# Patient Record
Sex: Female | Born: 1935 | Race: Black or African American | Hispanic: No | Smoking: Never smoker
Health system: Southern US, Community
[De-identification: ages and names within clinical notes are randomized; demographics above are authoritative.]

## PROBLEM LIST (undated history)

## (undated) ENCOUNTER — Emergency Department (HOSPITAL_COMMUNITY): Payer: Medicare Other | Source: Home / Self Care

## (undated) DIAGNOSIS — E6609 Other obesity due to excess calories: Secondary | ICD-10-CM

## (undated) DIAGNOSIS — Z8489 Family history of other specified conditions: Secondary | ICD-10-CM

## (undated) DIAGNOSIS — I350 Nonrheumatic aortic (valve) stenosis: Secondary | ICD-10-CM

## (undated) DIAGNOSIS — E119 Type 2 diabetes mellitus without complications: Secondary | ICD-10-CM

## (undated) DIAGNOSIS — R42 Dizziness and giddiness: Secondary | ICD-10-CM

## (undated) DIAGNOSIS — R011 Cardiac murmur, unspecified: Secondary | ICD-10-CM

## (undated) DIAGNOSIS — I10 Essential (primary) hypertension: Secondary | ICD-10-CM

## (undated) DIAGNOSIS — Z973 Presence of spectacles and contact lenses: Secondary | ICD-10-CM

## (undated) DIAGNOSIS — I447 Left bundle-branch block, unspecified: Secondary | ICD-10-CM

## (undated) DIAGNOSIS — E785 Hyperlipidemia, unspecified: Secondary | ICD-10-CM

## (undated) DIAGNOSIS — Z9289 Personal history of other medical treatment: Secondary | ICD-10-CM

## (undated) DIAGNOSIS — E039 Hypothyroidism, unspecified: Secondary | ICD-10-CM

## (undated) DIAGNOSIS — I517 Cardiomegaly: Secondary | ICD-10-CM

## (undated) HISTORY — DX: Dizziness and giddiness: R42

## (undated) HISTORY — DX: Essential (primary) hypertension: I10

## (undated) HISTORY — DX: Nonrheumatic aortic (valve) stenosis: I35.0

## (undated) HISTORY — DX: Left bundle-branch block, unspecified: I44.7

## (undated) HISTORY — DX: Personal history of other medical treatment: Z92.89

## (undated) HISTORY — DX: Hyperlipidemia, unspecified: E78.5

## (undated) HISTORY — DX: Other obesity due to excess calories: E66.09

## (undated) HISTORY — DX: Cardiac murmur, unspecified: R01.1

## (undated) HISTORY — DX: Cardiomegaly: I51.7

## (undated) HISTORY — DX: Family history of other specified conditions: Z84.89

---

## 2001-07-07 ENCOUNTER — Encounter: Payer: Self-pay | Admitting: Emergency Medicine

## 2001-07-07 ENCOUNTER — Emergency Department (HOSPITAL_COMMUNITY): Admission: EM | Admit: 2001-07-07 | Discharge: 2001-07-07 | Payer: Self-pay | Admitting: Emergency Medicine

## 2006-10-23 HISTORY — PX: LAPAROSCOPIC CHOLECYSTECTOMY: SUR755

## 2007-01-28 ENCOUNTER — Inpatient Hospital Stay (HOSPITAL_COMMUNITY): Admission: EM | Admit: 2007-01-28 | Discharge: 2007-02-04 | Payer: Self-pay | Admitting: Emergency Medicine

## 2007-01-30 ENCOUNTER — Encounter (INDEPENDENT_AMBULATORY_CARE_PROVIDER_SITE_OTHER): Payer: Self-pay | Admitting: Specialist

## 2008-06-03 ENCOUNTER — Encounter: Admission: RE | Admit: 2008-06-03 | Discharge: 2008-06-03 | Payer: Self-pay | Admitting: Family Medicine

## 2008-06-07 ENCOUNTER — Ambulatory Visit: Payer: Self-pay | Admitting: Cardiovascular Disease

## 2008-06-07 ENCOUNTER — Inpatient Hospital Stay (HOSPITAL_COMMUNITY): Admission: EM | Admit: 2008-06-07 | Discharge: 2008-06-10 | Payer: Self-pay | Admitting: Emergency Medicine

## 2008-06-08 ENCOUNTER — Encounter: Payer: Self-pay | Admitting: Cardiology

## 2008-06-09 ENCOUNTER — Ambulatory Visit: Payer: Self-pay | Admitting: Vascular Surgery

## 2008-06-09 ENCOUNTER — Encounter: Payer: Self-pay | Admitting: Cardiology

## 2010-06-30 ENCOUNTER — Ambulatory Visit: Payer: Self-pay | Admitting: Cardiology

## 2010-11-01 ENCOUNTER — Ambulatory Visit: Payer: Self-pay | Admitting: Cardiology

## 2011-02-08 ENCOUNTER — Other Ambulatory Visit: Payer: Self-pay | Admitting: *Deleted

## 2011-02-08 DIAGNOSIS — E7800 Pure hypercholesterolemia, unspecified: Secondary | ICD-10-CM

## 2011-02-08 MED ORDER — ROSUVASTATIN CALCIUM 10 MG PO TABS: ORAL_TABLET | ORAL | Status: DC

## 2011-02-08 NOTE — Telephone Encounter (Signed)
Refilled meds per fax request.  

## 2011-03-07 NOTE — Discharge Summary (Signed)
NAMEJIMIA, GENTLES           ACCOUNT NO.:  [id number]   MEDICAL RECORD NO.:  [id number]          PATIENT TYPE:  INP   LOCATION:  2011                         FACILITY:  [facility]   PHYSICIAN:  Cassell Clement, M.D. DATE OF BIRTH:  01/23/1931   DATE OF ADMISSION:  06/07/2008  DATE OF DISCHARGE:  06/10/2008                               DISCHARGE SUMMARY   FINAL DIAGNOSES:  1. Syncope.  2. Uncontrolled hypertension.  3. Moderately severe aortic stenosis.  4. Left ventricular hypertrophy with diastolic dysfunction.  5. Hypercholesterolemia.   OPERATIONS PERFORMED:  1. Two-dimensional echocardiogram.  2. Carotid Dopplers.   HISTORY:  This 77 year old African-American woman was admitted through  the emergency room after having syncope in church on Sunday morning,  June 07, 2008.  She had not felt well the day before and felt a little  queasy and had a low-grade temperature, but on the morning of Sunday,  she felt well and went to church.  She did not eat any breakfast that  morning.  After standing and then sitting down, she passed out for  approximately a minute and she did have a warning that she was about to  pass out before she actually had syncope.  There was no definite seizure  activity.  She was brought to the emergency room where she felt well,  but because of the history, was admitted for further evaluation.  She  was also noted to have marked hypertension.   Medications at home had included aspirin 81 mg daily, Crestor 10 mg 1/2  tablet daily, Diovan 320, HCT 25 one daily, Toprol-XL 100 mg daily,  Tarka 4/240 daily, potassium 10 mEq daily, and calcium 600 mg daily.  She had been on a steroid the previous week for a pinched nerve, but  had finished it about a week prior to this episode.   Examination on admission was remarkable for hypertension, normal oxygen  saturation on room air, and a harsh grade 3/6 systolic murmur of the  aortic stenosis at the base.  She did  not have evidence of heart failure  or peripheral edema.   Her chest x-ray showed cardiomegaly, but no acute process and her EKG  showed inferolateral nonspecific ST-T wave abnormalities, felt to be  secondary to her hypertension.   Admission lab data showed no evidence of acute myocardial infarction.  Cardiac markers were normal.  Renal function was normal with a BUN of  16, creatinine of 0.9.  Potassium was 3.4 on admission and the following  day it was 3.1.  Rhythm was sinus rhythm with sinus bradycardia.   HOSPITAL COURSE:  She was initially admitted to CCU.  The following day,  she was transferred to telemetry.  Her Tarka, which was a combination of  Mavik and verapamil was stopped because of her bradycardia and her dose  of beta-blocker was decreased from 100-50 mg of metoprolol XL.  In the  place of Tarka, we have put her on lisinopril and amlodipine.  Because  of her low potassium, blood was drawn for an aldosterone and renin  ratio, but that result is not available at the  time of discharge.  She  also underwent a 24-hour urine collection for metanephrines and this is  also pending.  In the hospital, blood pressure improved on her new  medical regimen, which included a trial of spironolactone 25 mg daily.  Potassium was repleted cautiously in the phase of spironolactone and  lisinopril.  By June 10, 2008, blood pressure was down to an  acceptable range for her, 165/87.  Lungs were clear and she was feeling  well.  She was felt to be ready for discharge.  She will be discharged  to home.  She will have home health nurse.  She will purchase a home  blood pressure monitor and cuff for home use and keep a written record.  She will be instructed by dietician prior to discharge on a low-  cholesterol, no added salt, heart-healthy diet.  She is a nonsmoker.   DISCHARGE REGIMEN:  She will be on amlodipine 10 mg 1 daily, aspirin 81  mg daily, Crestor 10 mg 1/2 tablet daily, calcium  with vitamin D daily,  Toprol-XL 50 mg daily, lisinopril 20 mg twice a day, spironolactone 25  mg once a day, K-Dur 10 mEq daily, potassium 25 mg daily, and Tylenol as  needed.   CONDITION ON DISCHARGE:  Improved.   She will be seen back in the office in 1 week for office visit and  fasting lipid panel and CMET.  Her cholesterol was not checked in the  hospital, but we will check it as an outpatient.           ______________________________  Cassell Clement, M.D.     TB/MEDQ  D:  06/10/2008  T:  06/10/2008  Job:  [id number]   cc:   Windle Guard, M.D.

## 2011-03-07 NOTE — H&P (Signed)
[name], [name] NO.:  [id number]   MEDICAL RECORD NO.:  [id number]          PATIENT TYPE:  EMS   LOCATION:  MAJO                         FACILITY:  [facility]   PHYSICIAN:  Christell Faith, MD   DATE OF BIRTH:  [birth year]   DATE OF ADMISSION:  06/07/2008  DATE OF DISCHARGE:                              HISTORY & PHYSICAL   Admitted to Dr. Cassell Clement with [facility].   PRIMARY CARE PHYSICIAN:  1. Currie Paris, MD  2. Gabrielle Dare [name], MD   CHIEF COMPLAINT:  Syncope.   HISTORY OF PRESENT ILLNESS:  This is a 78 year old African American  female with a history of significant hypertension and possible aortic  stenosis who experienced syncope today in [place] while in a seated  position.  This has never happened before, although she does remember  feeling lightheaded yesterday.  The patient has no known history of  ischemic heart disease and has not experienced any chest pain or  congestive heart failure symptoms.  The patient took all of her  medicines this morning but did not eat breakfast.  Despite this, she  fell normal going to church.  She had been standing up singing and then  sat down for the sermon and had awareness that she was about to pass out  prior to doing so.  Witnesses say that they think she was unconscious  for about 1 minute.  There is also description of mild twitching and  there was some initial concern for a seizure although there was no  postictal state and no loss of continence.  The patient quickly felt  back to normal and feels well at this point.   PAST MEDICAL HISTORY:  1. Hypertension.  2. History of cholecystitis and acute pancreatitis in April 2008 at      which time she underwent laparoscopic cholecystectomy.  3. Per previous hospital records, there is possible aortic stenosis      and aortic insufficiency.  4. Diastolic dysfunction with postoperative congestive heart failure      following lap chole.  5. Pulmonary hypertension.  6. Hyperlipidemia.   SOCIAL HISTORY:  [place].  She is a  widow.  She has never smoked and does not use alcohol.   FAMILY HISTORY:  Mother lived to be 34 but has significant hypertension  and several strokes.  Father had Parkinson's disease.  Several siblings  have hypertension.   ALLERGIES:  None.   MEDICINES:  1. Aspirin 81 mg p.o. daily.  2. Crestor 10 mg p.o. daily.  3. Diovan HCT 320/25 mg p.o. daily.  4. Toprol-XL 100 mg p.o. daily.  5. Tarka (trandolapril and verapamil 4/240 mg p.o. daily).  6. Potassium chloride 10 mEq p.o. daily.  7. Calcium supplementation 600 mg p.o. daily.  In addition, she finished an oral steroid taper last week for a pinched  nerve in her neck.   REVIEW OF SYSTEMS:  Positive for neuropathic upper neck pain.  Positive  for syncope today preceded by presyncope.  Positive for presyncope  yesterday otherwise the balance of 14 systems  is reviewed and is  negative.   PHYSICAL EXAMINATION:  VITAL SIGNS:  Temperature 97.6, pulse 50-55,  respiratory rate 16, blood pressure initially 183/73, repeat 165/63,  saturation 100% on room air.  GENERAL:  This is a pleasant older African American female in no acute  distress.  Pupils are equal, round, and reactive to light.  Sclerae are  clear.  Extraocular movements intact.  Mucous membranes are moist.  Oropharynx is clear.  NECK:  Supple.  No elevation of jugular venous pressure.  No carotid  bruits.  Carotid upstrokes are normal to palpation.  There is no reflex  bradycardia with palpation of the carotid arteries.  CARDIAC:  Reveals bradycardic rate, regular rhythm, 3/6 harsh systolic  ejection murmur at the base, S2 is well preserved.  LUNGS:  Clear to auscultation bilaterally without wheezing or rales.  ABDOMEN:  Soft, nontender, and nondistended with normal bowel sounds.  EXTREMITIES:  Reveal trace puffy ankle edema.  Dorsalis pedis and   posterior tibialis pulses are diminished but present.  The feet appear  warm and well-perfused.  Radial pulses are 2+.  MUSCULOSKELETAL:  No acute joint effusions or deformities.  NEUROLOGIC:  Awake, alert, and oriented x3.  Facial expression is  symmetric and normal 5/5 strength in all four extremities.  SKIN:  Reveals no rash or skin lesions.  Skin is warm and dry.   DIAGNOSTIC TESTS:  Chest X-ray shows mild vascular congestion with no  edema.  Head CT shows no acute process.  EKG shows sinus bradycardia  with a rate of 52 beats per minute.  There are inferolateral T-wave  inversions, which are increased compared to April 2008.   LABORATORY DATA:  White blood cell 4.8, hemoglobin 13.6, platelets 184,  sodium 137, potassium 3.4, BUN 16, creatinine 0.9, glucose 125, BNP 131,  CK-MB less than 1, troponin less than 0.05.   IMPRESSION:  A 77 year old [place] American female with a history of  hypertension and possible aortic stenosis who experienced syncope today  while in a seated position.   PLAN:  1. Admit to telemetry and monitor her heart rate and heart rhythm.  2. Decrease her Toprol-XL to allow her heart rate to increase.      Likewise, we will substitute amlodipine for verapamil.  3. Otherwise, continue her current medicines as described above.  4. Rule out myocardial infarction by cycling serial EKGs and cardiac      enzymes.  The patient will probably need inpatient versus      outpatient stress test given the EKG changes and symptoms.  5. Check transthoracic echo to evaluate LV function and aortic valve.      Murmur is harsh, however, S2 is well preserved and carotid      upstrokes are normal, doubt severe AS.  6. The patient is noted to have hypokalemia despite being on both ACE      inhibitor and angiotensin receptor      blocker.  This raises question of hyperaldosteronism.  We will      check fasting a.m. serum aldosterone and renin levels.  7. The patient will be  placed on subcutaneous Lovenox for DVT      prophylaxis.      Christell Faith, MD  Electronically Signed     NDL/MEDQ  D:  06/07/2008  T:  06/08/2008  Job:  [id number]   cc:   Dr. Marita Snellen

## 2011-03-10 NOTE — H&P (Signed)
[name], [name]           ACCOUNT NO.:  [id number]   MEDICAL RECORD NO.:  [id number]          PATIENT TYPE:  INP   LOCATION:  5733                         FACILITY:  [facility]   PHYSICIAN:  Gabrielle Dare. [name], M.D.DATE OF BIRTH:  1931-03-28   DATE OF ADMISSION:  01/28/2007  DATE OF DISCHARGE:                              HISTORY & PHYSICAL   CHIEF COMPLAINT:  Upper abdominal pain.   HISTORY OF PRESENT ILLNESS:  The patient is a 77 year old African-  American female with an acute onset of epigastric abdominal pain around  8 p.m. last night after eating some cornbread and coffee.  The pain was  constant.  She was unable to get comfortable.  She describes the pain in  the epigastric region and radiating through to her back.  She tried  taking some Tums without relief.  She came [facility].   PAST MEDICAL HISTORY:  1. Hypertension.  2. Aortic stenosis.  3. Aortic insufficiency.  4. Moderate pulmonary hypertension.  5. Cardiac diastolic dysfunction.  6. Cardiomyopathy.   Significant family history includes her father had a history of  Parkinson's.   SOCIAL HISTORY:  She does not smoke.  She does not drink alcohol or use  drugs.  She is a widow and lives alone.  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   ALLERGIES:  No known drug allergies.   CURRENT MEDICATIONS:  1. Aspirin 81 mg daily.  2. Crestor 10 mg daily.  3. Diovan/HCTZ 320/25 daily.  4. Toprol XL 100 mg daily.  5. Tarka 4-240 mg 1 daily.  6. Tektura 300 mg daily.   REVIEW OF SYSTEMS:  GASTROINTESTINAL:  Significant for the epigastric  pain and also some nausea.  PULMONARY:  No shortness of breath.  CARDIAC:  No chest pain.  The remainder of the review of systems is  unremarkable.   PHYSICAL EXAMINATION:  Temperature 97.2, pulse 55, respirations 22,  blood pressure 200/110 manually.  In general, she is awake and alert, and mildly uncomfortable.  HEENT:  Oral mucosa is moist.  Sclerae is clear.   Pupils are reactive.  NECK:  Supple with no tenderness.  LUNGS:  Clear to auscultation with descent respiratory excursions.  HEART:  Regular, no significant murmurs are heard.  Impulse is palpable  on the left chest.  ABDOMEN:  Epigastric tenderness with mild to moderate abdominal  distention.  No focal mass is palpated.  Bowel sounds are hypoactive,  and no organomegaly is noted.  EXTREMITIES:  Has some moderate peripheral edema especially in the  ankles.  SKIN:  Warm and dry with no rashes.   LABORATORY STUDIES:  White blood cell count 14.3, hemoglobin 14.1,  platelets 239, AST 353, ALT 234, total bilirubin 1.2, lipase is 3501.  CT scan of the abdomen:  There is inflammatory process around the  pancreas and gallbladder with some gallbladder wall thickening  consistent with pancreatitis and acute cholecystitis.  Abdominal  ultrasound demonstrates irregular gallbladder wall thickening, no  gallstones noted.  There is mild biliary dilatation of the common bile  duct of 10 mm.   IMPRESSION:  1.  Acute biliary pancreatitis with some mild common bile duct      dilatation.  2. Associated cholecystitis.  3. Uncontrolled hypertension.   PLAN:  Plan will be to admit her to the [facility] service.  We will obtain  Cardiology consultation in light of her history and upcoming need for  surgery.  We will treat her with bowel rest and IV antibiotics and IV  fluid to pull down her pancreatitis, and once that improves we will plan  on proceeding with cholecystectomy, and plan was discussed in detail  with the patient and her family.   Cardiology evaluation for managing her hypertension and for operative  clearance.      Gabrielle Dare [name], M.D.  Electronically Signed     BET/MEDQ  D:  01/28/2007  T:  01/28/2007  Job:  [id number]   cc:   Dr. Marita Snellen, Cardiologist

## 2011-03-10 NOTE — Op Note (Signed)
[name], [name]           ACCOUNT NO.:  [id number]   MEDICAL RECORD NO.:  [id number]          PATIENT TYPE:  INP   LOCATION:  5733                         FACILITY:  [facility]   PHYSICIAN:  Gabrielle Dare. [name], M.D.DATE OF BIRTH:  03-26-1931   DATE OF PROCEDURE:  DATE OF DISCHARGE:                               OPERATIVE REPORT   PREOPERATIVE DIAGNOSIS:  Biliary pancreatitis.   POSTOPERATIVE DIAGNOSIS:  Biliary pancreatitis.   PROCEDURE:  Laparoscopic cholecystectomy and intraoperative  cholangiogram.   SURGEON:  Gabrielle Dare. [name], M.D.   ASSISTANT:  Adolph Pollack, M.D.   HISTORY OF PRESENT ILLNESS:  The patient is a 77 year old African  American female who we admitted on January 28, 2007 with biliary  pancreatitis.  Her liver function tests and lipase have decreased  significantly.  Cardiology has been following her and has cleared her  for surgery and we are planning laparoscopic cholecystectomy with  intraoperative cholangiogram.   PROCEDURE IN DETAIL:  Informed consent was obtained.  The patient  received intravenous antibiotics.  She was brought to the operating  room.  General anesthesia was administered.  Her abdomen was prepped and  draped in a sterile fashion.  The infraumbilical region was infiltrated  with 0.25% Marcaine with epinephrine and infraumbilical incision was  made.  Subcutaneous tissues were dissected down revealing the anterior  fascia.  This was divided sharply.  The peritoneal cavity was then  entered under direct vision without difficulty.  A 0 Vicryl pursestring  suture was placed around the fascial opening and the Hasson trocar was  inserted into the abdomen.  The abdomen was insufflated with CO2 in a  standard fashion.  Under direct vision, the 11-mm epigastric, and two 5-  mm lateral ports were placed.  The 0.25% Marcaine with epinephrine was  used at all port sites.  Laparoscopic exploration revealed some  adhesions to the abdominal wall  in the right lateral portion and above  the liver.  These were taken down with sharp dissection.  They were  filmy and did not contain bowel.  Once this was done, she still had a  lot of colonic dilatation.  After acquiring good visualization at the  gallbladder, another 5-mm port was inserted to the left side of the  midline after infiltrating local anesthetic again and a flexible liver  retractor was inserted.  This was able to gently hold down the colon out  of the way and give excellent visualization.  The dome of the  gallbladder was then directed superomedially.  The infundibulum was  retracted inferolaterally.  Dissection began laterally and progressed  medially, easily identifying the cystic duct and the cystic artery.  Both were circumferentially dissected.  Dissection continued on the  cystic duct until a large window was created between the infundibulum  and the cystic duct and the liver.  Once this was done with excellent  visualization, a clip was placed on the infundibulocystic duct junction  after milking some stones.  They were in the cystic duct back up into  the gallbladder.  A small nick was made and a cystic duct cholangiogram  catheter  was inserted.  Intraoperative cholangiogram showed some  questionable areas of filling defect, but it was reviewed with the  radiologists and no common bile duct stones were noted on their thorough  review.  The cholangiogram catheter was removed.  Three clips were  placed proximally on the cystic duct and it was divided.  Two clips were  placed proximally and one distally on the cystic artery and that was  divided itself, but was taken off the liver bed with a Bovie cautery.  Excellent hemostasis was obtained along the way.  The gallbladder was  placed in an EndoCatch bag and taken out of the abdomen via the  infraumbilical port site.  The abdomen was copiously irrigated.  Irrigation fluid returned clear.  The liver bed was rechecked  and  remained dry.  Clips remained in good position.  The liver retractor was  unfolded and removed under direct vision.  Other ports were removed  under direct vision.  The pneumoperitoneum was released.  The Hasson  trocar was removed.  The infraumbilical fascia was closed by tying a 0  Vicryl pursestring suture with care not to trap any intra-abdominal  contents.  All five wounds were copiously irrigated.  The skin of each  was closed with a running 4-0 Vicryl subcuticular stitch.  Sponge,  needle, and instrument counts were correct.  Benzoin, Steri-Strips and  sterile dressings were applied.  The patient tolerated the procedure  well without apparent complication, and was taken to the recovery room  in stable condition.      Gabrielle Dare [name], M.D.  Electronically Signed     BET/MEDQ  D:  01/30/2007  T:  01/30/2007  Job:  [id number]

## 2011-03-10 NOTE — Discharge Summary (Signed)
[name], [name]           ACCOUNT NO.:  [id number]   MEDICAL RECORD NO.:  [id number]          PATIENT TYPE:  INP   LOCATION:  6711                         FACILITY:  [facility]   PHYSICIAN:  Currie Paris, M.D.DATE OF BIRTH:  [birth year]   DATE OF ADMISSION:  01/28/2007  DATE OF DISCHARGE:  02/04/2007                               DISCHARGE SUMMARY   CHIEF COMPLAINT/REASON FOR ADMISSION:  Ms. Gruetzmacher is a 77 year old  female patient with a history of aortic stenosis and insufficiency,  pulmonary hypertension, cardiac diastolic dysfunction, cardiomyopathy  and hypertension who presented with epigastric abdominal pain after  eating.  Pain was constant in the epigastric region and radiating to her  back.  She presented to the ER.  She was found to have elevated blood  pressure 200/110.  On abdominal exam, she had epigastric tenderness with  mild to moderate abdominal distention without focal masses palpated.  Her white count was 14,300, AST 353, ALT 234, total bilirubin 1.2 and  lipase was 3501.  CT of the abdomen demonstrated inflammatory process  around the pancreas and gallbladder with some gallbladder wall  thickening consistent with acute pancreatitis and acute cholecystitis.  An abdominal ultrasound was also performed that revealed an irregular  gallbladder wall thickening, no gallstones, mild biliary dilatation of  the common bile duct at 10 mm.  The patient was admitted with following  diagnosis.  1. Biliary colic with acute cholecystitis.  2. Acute biliary pancreatitis.  3. Possible choledocholithiasis and mild common bile duct dilatation.  4. Uncontrolled hypertension presumed secondary to pain.   HOSPITAL COURSE:  The patient was admitted to the hospital with bowel  rest, IV fluids and IV antibiotics with plans to cool down her  pancreatitis and cholecystectomy once pancreatitis was clear.  Because  for cardiac history, Dr. Patty Sermons with cardiology was asked to  evaluate  the patient for her upcoming surgery for operative clearance.   Within 24 hours of admission, the patient was evaluated by cardiology.  Dr. Elease Hashimoto was on call for Dr. Patty Sermons.  Based on his evaluation, he  felt she should be an optimal candidate for surgical procedure and no  significant risks.  On hospital day #1, the patient's LFTs were  decreasing and her lipase had decreased markedly to 373.  She had  decreased pain and was not experiencing any more nausea or vomiting.  Her biliary pancreatitis seemed to be resuming and based on her  ultrasound it appears she had acalculous cholecystitis versus passage of  a solitary stone based on the ultrasound result and the common bile duct  dilatation.  She was started on sequential compressive stockings as well  as Lovenox and plans were to proceed with laparoscopic cholecystectomy  in the morning.  She was continued on Unasyn IV.  By January 30, 2007 the  patient's LFTs had improved.  Her lipase was down to 85 and it was felt  her biliary pancreatitis was resolved, so she would be appropriate to  proceed with gallbladder surgery.  On January 30, 2007 the patient did  undergo a laparoscopic cholecystectomy with intraoperative  cholangiogram, tolerated the procedure  well and was sent back to the  floor to recover.  Later that evening, the patient developed shortness  of breath and wheezing.  She was placed on BiPAP and physicians were  notified.  Cardiology evaluated the patient.  Chest x-ray showed  increased cardiomegaly and probably acute CHF and atelectasis.  A BNP  was checked and she was given Lasix 40 IV on a now basis.  She was  subsequently transferred to the 3200 unit for monitoring since she was  requiring BiPAP for adequate oxygenation.  She was also started on  inhaled bronchodilators.  Cardiology continued to follow the patient for  the next several days.  She did spike a temperature up to 101.3 that had  defervesced by the  morning.  Her BiPAP was used mainly at night and she  was having good O2 sats on nasal cannula O2.  Her white count was 14,000  24 hours prior.  This was on postop day #1.  Her blood pressure was  still elevated and she was followed closely.  Her potassium was low and  this was repeated.  She was also having a productive cough and it was  felt she may have an early pneumonia, so Avelox was started.  By postop  day #3 she was tolerating p.o.'s, her white count was 9900, potassium  3.8, creatinine 0.95 after Lasix.  She had increased productive cough.  From a surgical standpoint, she was doing better.  Cardiology agreed  that the patient was stable enough to transfer from step down to basic  telemetry bed.  From a cardiology standpoint, her blood pressure was  still elevated and they continued to follow the patient.  From a  surgical standpoint, she was deemed appropriate for discharge home.  She  was tolerating solid diet, having bowel movements and her wounds were  stable.  By February 04, 2007 which would have been postop day #3,  cardiology felt the patient was appropriate for discharge home.  She was  not having any respiratory distress.  She will continue on similar meds  prior to admission.  Dr. Patty Sermons has noted any new meds or changes.   DISCHARGE DIAGNOSES:  1. Acute biliary pancreatitis, resolved.  2. Status post laparoscopic cholecystectomy.  3. Transaminitis, resolved.  4. History of cardiomyopathy and cardiac valvular disease with acute      exacerbation of congestive heart failure postoperatively.  5. Controlled hypertension, better.  6. Mild pneumonia, improving.  Hypoxia resolved.   DISCHARGE MEDICATIONS:  1. Aspirin 81 mg daily.  2. Crestor 10 mg daily.  3. Diovan/HCT 320/25 daily.  4. Generic Toprol XL 100 mg daily.  5. Tarka 4/240 1 daily.  6. Tekturna 300 mg daily.  7. K-Dur 10 mEq 1 daily, generic formulation, this is a new      medication.  8. Avelox 400 mg 1  daily for 5 days.  This is new.  9. Vicodin 1 tablet every 6 hours as needed for pain.  This is new.  10.Tylenol for mild pain.   DIET:  Low sodium heart-healthy.   WOUND CARE:  Allow any Steri-Strips to fall off.   ACTIVITY:  Increase activity slowly.  May shower.  No lifting greater  than 15 pounds for 3 weeks.  No driving while taking any narcotic pain  medications.   FOLLOWUP:  She needs to followup with Dr. [name] in 2 weeks.  She  needs to call for an appointment.  She needs to see Dr. Patty Sermons  in 1  week for an office visit to have CBC, C-MET and BMP checked.  Nurse will  call you with appointment.      Allison L. Rennis Harding, N.P.      Currie Paris, M.D.  Electronically Signed    ALE/MEDQ  D:  05/16/2007  T:  05/17/2007  Job:  [id number]   cc:   Cassell Clement, M.D.  Dr. [name]

## 2011-03-10 NOTE — Consult Note (Signed)
[name], [name]           ACCOUNT NO.:  [id number]   MEDICAL RECORD NO.:  [id number]          PATIENT TYPE:  INP   LOCATION:  1824                         FACILITY:  [facility]   PHYSICIAN:  Vesta Mixer, M.D. DATE OF BIRTH:  12/09/1930   DATE OF CONSULTATION:  01/28/2007  DATE OF DISCHARGE:                                 CONSULTATION   REASON FOR CONSULTATION:  [name] is a 77 year old female with  a history of hypertension and valvular heart disease.  She is admitted  to the hospital with severe abdominal pain and the diagnosis of  acalculous cholecystitis.   The patient has a long history of hypertension and heart murmurs.  She  was last seen in our office on December 24, 2006.  At that time she was  congested due to a head cold but otherwise was doing fairly well.  She  denies any cardiac problems.  She specifically denies any angina or  worsening shortness of breath.  She exercises on a fairly regular basis  and has not had any particular problems with exercise.  She denies any  syncope or presyncope.  She denies any hemoptysis, cough or sputum  production.   Last night she developed severe abdominal pain.  The pain did not  resolve and she presented to the emergency room this morning.   CURRENT MEDICATIONS:  1. Toprol XL 100 mg a day.  2. Tarka 4 mg/240 mg once a day.  3. Crestor 5 mg a day.  4. Diovan 320 mg/25 mg once a day.  5. Aspirin 81 mg a day.  6. Tekturna 300 mg a day.   ALLERGIES:  SHE HAS NO KNOWN DRUG ALLERGIES.   PAST MEDICAL HISTORY:  1. Hypertension.  2. Hyperlipidemia.  3. Aortic stenosis.  4. Pulmonary hypertension.  5. Diastolic dysfunction.   SOCIAL HISTORY:  The patient is a nonsmoker.  She used to work as an  Midwife at [employer].   FAMILY HISTORY:  Noncontributory.   REVIEW OF SYSTEMS:  As noted in the HPI and is otherwise negative.  She  denies any cough or sputum production.  She denies any previous cardiac  problems.  She has had one episode of abdominal pain that is similar to  this one.  She denies any syncope or presyncope, PND or orthopnea.   EXAMINATION:  GENERAL:  She is an elderly black female in mild distress.  VITAL SIGNS:  Her blood pressure is 200/110 with a heart rate of 70.  HEENT:  Exam reveals 2+ carotids, she has no bruits, no JVD.  LUNG:  Exam reveals a few basilar crackles.  HEART:  Regular rate, S1-S2.  She has a 2/6 systolic ejection murmur  radiating to the upper left sternal border.  ABDOMINAL:  Exam reveals good bowel sounds and is mildly tender  especially mid upper quadrant.  EXTREMITIES:  She has no clubbing, cyanosis or edema.  NEUROLOGICAL:  Exam is nonfocal.   LABORATORY DATA:  Her sodium is 142, potassium is 3.4, chloride is 103,  CO2 is 31, creatinine 0.9, glucose is 165.  White blood cell count is  14.3, hemoglobin is 14.1, lipase is 3501.   IMPRESSION AND PLAN:  1. Acalculous cholecystitis.  The patient had a gallbladder ultrasound      which did not reveal any gallstones.  She has a markedly elevated      lipase level.  She will be kept n.p.o. for a couple of days and      further plans per the surgeons.  2. Hypertension.  The patient's blood pressure remains elevated.  The      surgeons have kept her on p.o. medications for now.  We can also      substitute some IV medications if needed including IV hydralazine      10-25 mg IV every 4-6 hours as needed as well as labetalol 10-20 mg      IV every couple of hours as needed.  We will start her on some low      dose hydralazine as well as her p.o. medications.  We will also get      an EKG today as well as one tomorrow.           ______________________________  Vesta Mixer, M.D.     PJN/MEDQ  D:  01/28/2007  T:  01/28/2007  Job:  [id number]   cc:   [facility]  Cassell Clement, M.D.

## 2011-03-16 ENCOUNTER — Encounter: Payer: Self-pay | Admitting: *Deleted

## 2011-03-16 DIAGNOSIS — I10 Essential (primary) hypertension: Secondary | ICD-10-CM

## 2011-03-16 DIAGNOSIS — I517 Cardiomegaly: Secondary | ICD-10-CM | POA: Insufficient documentation

## 2011-03-16 DIAGNOSIS — I350 Nonrheumatic aortic (valve) stenosis: Secondary | ICD-10-CM | POA: Insufficient documentation

## 2011-03-16 DIAGNOSIS — E785 Hyperlipidemia, unspecified: Secondary | ICD-10-CM | POA: Insufficient documentation

## 2011-03-17 ENCOUNTER — Ambulatory Visit (INDEPENDENT_AMBULATORY_CARE_PROVIDER_SITE_OTHER): Payer: Medicare Other | Admitting: Cardiology

## 2011-03-17 ENCOUNTER — Encounter: Payer: Self-pay | Admitting: Cardiology

## 2011-03-17 VITALS — BP 140/90 | HR 66 | Wt 216.0 lb

## 2011-03-17 DIAGNOSIS — I350 Nonrheumatic aortic (valve) stenosis: Secondary | ICD-10-CM

## 2011-03-17 DIAGNOSIS — I359 Nonrheumatic aortic valve disorder, unspecified: Secondary | ICD-10-CM

## 2011-03-17 DIAGNOSIS — I10 Essential (primary) hypertension: Secondary | ICD-10-CM

## 2011-03-17 DIAGNOSIS — I119 Hypertensive heart disease without heart failure: Secondary | ICD-10-CM

## 2011-03-17 DIAGNOSIS — E785 Hyperlipidemia, unspecified: Secondary | ICD-10-CM

## 2011-03-17 LAB — BASIC METABOLIC PANEL
GFR: 47.18 mL/min — ABNORMAL LOW (ref >60.00)
Potassium: 4 mEq/L (ref 3.5–5.1)
Sodium: 140 mEq/L (ref 135–145)

## 2011-03-17 NOTE — Assessment & Plan Note (Signed)
Patient has a long history of essential hypertension associated with exogenous obesity.  Recently she has been trying hard to lose weight.  She has not been expressing any chest pain or shortness of breath.  She's had no dizziness or syncope.  Sometime she will be slightly dizzy in the morning before she takes her medication.

## 2011-03-17 NOTE — Progress Notes (Signed)
Arville Care Date of Birth:  [birth year] [facility] / [facility], [place] [address]   [address] [place], [place]  [address] [phone]           Fax   [phone]  HPI: This pleasant 77 year old woman is seen for a four-month followup office visit.  She has a history of essential hypertension and hyperlipidemia.  She does have aortic stenosis.  Her last echocardiogram was in March of 2011 at which time her aortic valve area was 0.9.  She has LVH with diastolic dysfunction.  Since last visit she has not been expressing any chest pain.  She notes occasional dizziness early in the morning before she takes her medicine.  She has occasional leg cramps at night and we are checking a potassium level today.  She has not had cardiac catheterization.  She is not known to be diabetic.  Current Outpatient Prescriptions  Medication Sig Dispense Refill  . Acetaminophen (TYLENOL PO) Take 1 tablet by mouth as needed.        Marland Kitchen amLODipine (NORVASC) 10 MG tablet Take 10 mg by mouth daily.        Marland Kitchen aspirin 81 MG tablet Take 81 mg by mouth daily.        . hydrochlorothiazide 25 MG tablet Take 25 mg by mouth daily.        Marland Kitchen lisinopril (PRINIVIL,ZESTRIL) 20 MG tablet Take 20 mg by mouth 2 (two) times daily.        . metoprolol (LOPRESSOR) 50 MG tablet Take 25 mg by mouth 2 (two) times daily.        . rosuvastatin (CRESTOR) 10 MG tablet Taking 1/2 daily       . spironolactone (ALDACTONE) 25 MG tablet Take 25 mg by mouth 2 (two) times daily.        Marland Kitchen DISCONTD: rosuvastatin (CRESTOR) 10 MG tablet Take as directed  30 tablet  11    No Known Allergies  Patient Active Problem List  Diagnoses  . Hypertension  . Hyperlipidemia  . LVH (left ventricular hypertrophy)  . Aortic stenosis    History  Smoking status  . Never Smoker   Smokeless tobacco  . Not on file    History  Alcohol Use No    No family history on file.  Review of Systems: The patient denies any heat or cold  intolerance.  No weight gain or weight loss.  The patient denies headaches or blurry vision.  There is no cough or sputum production.  The patient denies dizziness.  There is no hematuria or hematochezia.  The patient denies any muscle aches or arthritis.  The patient denies any rash.  The patient denies frequent falling or instability.  There is no history of depression or anxiety.  All other systems were reviewed and are negative.   Physical Exam: Filed Vitals:   03/17/11 0915  BP: 140/90  Pulse: 66  General appearance reveals a well-developed nourished woman in no distress.  Pupils equal and reactive.   Extraocular Movements are full.  There is no scleral icterus.  The mouth and pharynx are normal.  The neck is supple.  The carotids reveal no bruits.  The jugular venous pressure is normal.  The thyroid is not enlarged.  There is no lymphadenopathy.The chest is clear to percussion and auscultation. There are no rales or rhonchi. Expansion of the chest is symmetrical.The precordium is quiet.  The first heart sound is normal.  The second heart sound is  physiologically split.  There is no murmur gallop rub or click.  There is no abnormal lift or heave.The abdomen is soft and nontender. Bowel sounds are normal. The liver and spleen are not enlarged. There Are no abdominal masses. There are no bruits.The pedal pulses are good.  There is no phlebitis or edema.  There is no cyanosis or clubbing.Strength is normal and symmetrical in all extremities.  There is no lateralizing weakness.  There are no sensory deficits.The skin is warm and dry.  There is no rash.    Assessment / Plan: Continue attempts at weight loss and continue careful diet.  Recheck in 4 months for followup office visit and lab work

## 2011-03-17 NOTE — Assessment & Plan Note (Signed)
The patient has not had any of the cardinal manifestations of severe aortic stenosis.

## 2011-03-17 NOTE — Assessment & Plan Note (Signed)
The patient is on Crestor.  She's not having any myalgias from statin therapy.

## 2011-03-21 ENCOUNTER — Telehealth: Payer: Self-pay | Admitting: Cardiology

## 2011-03-21 NOTE — Telephone Encounter (Signed)
Message copied by Karle Plumber on Tue Mar 21, 2011 10:13 AM ------      Message from: Cassell Clement      Created: Sat Mar 18, 2011  3:44 PM       K= is normal.  Blood sugar only slightly high.Kidneys dry.  Increse water intake

## 2011-03-21 NOTE — Telephone Encounter (Signed)
Labs reported and encouraged her to increase water intake. Verbalizes understanding.

## 2011-05-01 ENCOUNTER — Other Ambulatory Visit: Payer: Self-pay | Admitting: *Deleted

## 2011-05-01 DIAGNOSIS — I119 Hypertensive heart disease without heart failure: Secondary | ICD-10-CM

## 2011-05-01 MED ORDER — AMLODIPINE BESYLATE 10 MG PO TABS: 10.0000 mg | ORAL_TABLET | Freq: Every day | ORAL | Status: DC

## 2011-05-01 MED ORDER — HYDROCHLOROTHIAZIDE 25 MG PO TABS: 25.0000 mg | ORAL_TABLET | Freq: Every day | ORAL | Status: DC

## 2011-05-01 MED ORDER — LISINOPRIL 20 MG PO TABS: 20.0000 mg | ORAL_TABLET | Freq: Two times a day (BID) | ORAL | Status: DC

## 2011-05-01 NOTE — Telephone Encounter (Signed)
Faxed refills for amlodipine,lisinopril,and hctz to [facility]

## 2011-07-10 ENCOUNTER — Other Ambulatory Visit: Payer: Self-pay | Admitting: Cardiology

## 2011-07-10 DIAGNOSIS — I119 Hypertensive heart disease without heart failure: Secondary | ICD-10-CM

## 2011-07-10 MED ORDER — SPIRONOLACTONE 25 MG PO TABS: 25.0000 mg | ORAL_TABLET | Freq: Two times a day (BID) | ORAL | Status: DC

## 2011-07-10 NOTE — Telephone Encounter (Signed)
done

## 2011-07-10 NOTE — Telephone Encounter (Signed)
Pt is out spironoloctone please call to [facility]

## 2011-07-18 ENCOUNTER — Other Ambulatory Visit (INDEPENDENT_AMBULATORY_CARE_PROVIDER_SITE_OTHER): Payer: Medicare Other | Admitting: *Deleted

## 2011-07-18 ENCOUNTER — Encounter: Payer: Self-pay | Admitting: Cardiology

## 2011-07-18 ENCOUNTER — Ambulatory Visit (INDEPENDENT_AMBULATORY_CARE_PROVIDER_SITE_OTHER): Payer: Medicare Other | Admitting: Cardiology

## 2011-07-18 VITALS — BP 130/76 | HR 72 | Ht 64.0 in | Wt 214.0 lb

## 2011-07-18 DIAGNOSIS — I10 Essential (primary) hypertension: Secondary | ICD-10-CM

## 2011-07-18 DIAGNOSIS — E785 Hyperlipidemia, unspecified: Secondary | ICD-10-CM

## 2011-07-18 DIAGNOSIS — I119 Hypertensive heart disease without heart failure: Secondary | ICD-10-CM

## 2011-07-18 DIAGNOSIS — I359 Nonrheumatic aortic valve disorder, unspecified: Secondary | ICD-10-CM

## 2011-07-18 DIAGNOSIS — E7800 Pure hypercholesterolemia, unspecified: Secondary | ICD-10-CM

## 2011-07-18 DIAGNOSIS — I350 Nonrheumatic aortic (valve) stenosis: Secondary | ICD-10-CM

## 2011-07-18 LAB — LIPID PANEL
Cholesterol: 103 mg/dL (ref 0–200)
HDL: 43.7 mg/dL (ref >39.00)
VLDL: 15.4 mg/dL (ref 0.0–40.0)

## 2011-07-18 LAB — HEPATIC FUNCTION PANEL
ALT: 13 U/L (ref 0–35)
Bilirubin, Direct: 0.1 mg/dL (ref 0.0–0.3)
Total Protein: 7.7 g/dL (ref 6.0–8.3)

## 2011-07-18 LAB — BASIC METABOLIC PANEL
BUN: 35 mg/dL — ABNORMAL HIGH (ref 6–23)
GFR: 44.54 mL/min — ABNORMAL LOW (ref >60.00)
Potassium: 5.1 mEq/L (ref 3.5–5.1)
Sodium: 140 mEq/L (ref 135–145)

## 2011-07-18 NOTE — Assessment & Plan Note (Signed)
No symptoms of headache or dizziness

## 2011-07-18 NOTE — Patient Instructions (Signed)
We will schedule an echocardiogram to look at the aortic valve.

## 2011-07-18 NOTE — Progress Notes (Addendum)
Arville Care Date of Birth:  [birth year] [facility] / [facility] [address]   [address] [place], [place]  [address] [phone]           Fax   [phone]  HPI: This pleasant 77 year old woman is seen for a four-month followup office visit.  She has a history of essential hypertension and hyperlipidemia.  She also has a history of a murmur of aortic stenosis.  Her last echocardiogram in March 2011 showed an aortic valve area of 0.9.  She does have LVH with diastolic dysfunction she has not been expressing any new symptoms recently.  Current Outpatient Prescriptions  Medication Sig Dispense Refill  . Acetaminophen (TYLENOL PO) Take 1 tablet by mouth as needed.        Marland Kitchen amLODipine (NORVASC) 10 MG tablet Take 1 tablet (10 mg total) by mouth daily.  30 tablet  11  . aspirin 81 MG tablet Take 81 mg by mouth daily.        . hydrochlorothiazide 25 MG tablet Take 1 tablet (25 mg total) by mouth daily.  30 tablet  11  . lisinopril (PRINIVIL,ZESTRIL) 20 MG tablet Take 1 tablet (20 mg total) by mouth 2 (two) times daily.  60 tablet  11  . metoprolol (LOPRESSOR) 50 MG tablet Take 25 mg by mouth 2 (two) times daily.        . rosuvastatin (CRESTOR) 10 MG tablet Taking 1/2 daily       . spironolactone (ALDACTONE) 25 MG tablet Take 1 tablet (25 mg total) by mouth 2 (two) times daily.  60 tablet  11    No Known Allergies  Patient Active Problem List  Diagnoses  . Hypertension  . Hyperlipidemia  . LVH (left ventricular hypertrophy)  . Aortic stenosis    History  Smoking status  . Never Smoker   Smokeless tobacco  . Not on file    History  Alcohol Use No    No family history on file.  Review of Systems: The patient denies any heat or cold intolerance.  No weight gain or weight loss.  The patient denies headaches or blurry vision.  There is no cough or sputum production.  The patient denies dizziness.  There is no hematuria or hematochezia.  The patient denies  any muscle aches or arthritis.  The patient denies any rash.  The patient denies frequent falling or instability.  There is no history of depression or anxiety.  All other systems were reviewed and are negative.   Physical Exam: Filed Vitals:   07/18/11 0918  BP: 130/76  Pulse: 72   The general appearance reveals a well-developed well-nourished woman in no distress.Pupils equal and reactive.   Extraocular Movements are full.  There is no scleral icterus.  The mouth and pharynx are normal.  The neck is supple.  The carotids reveal no bruits.  The jugular venous pressure is normal.  The thyroid is not enlarged.  There is no lymphadenopathy.  The chest is clear to percussion and auscultation. There are no rales or rhonchi. Expansion of the chest is symmetrical.    Heart reveals grade 3/6 systolic ejection murmur at the base.  The murmur radiates to the neck The abdomen is soft and nontender. Bowel sounds are normal. The liver and spleen are not enlarged. There Are no abdominal masses. There are no bruits.  The pedal pulses are good.  There is no phlebitis or edema.  There is no cyanosis or clubbing.  Strength is normal and symmetrical in all extremities.  There is no lateralizing weakness.  There are no sensory deficits.  The skin is warm and dry.  There is no rash.     Assessment / Plan: Continue same medication.  We will update her Echocardiogram

## 2011-07-18 NOTE — Assessment & Plan Note (Signed)
The patient is on Crestor and his not having any adverse side effects and her lipid results today are excellent

## 2011-07-18 NOTE — Assessment & Plan Note (Signed)
The patient has a known murmur of aortic stenosis.  We will update her echo .She has not been experiencing any exertionalDizziness or syncope or symptoms of congestive heart failure.  She does have some lack of energy.

## 2011-07-25 ENCOUNTER — Encounter: Payer: Self-pay | Admitting: *Deleted

## 2011-07-25 ENCOUNTER — Ambulatory Visit (HOSPITAL_COMMUNITY): Payer: Medicare Other | Attending: Cardiology | Admitting: Radiology

## 2011-07-25 DIAGNOSIS — I10 Essential (primary) hypertension: Secondary | ICD-10-CM | POA: Insufficient documentation

## 2011-07-25 DIAGNOSIS — E785 Hyperlipidemia, unspecified: Secondary | ICD-10-CM | POA: Insufficient documentation

## 2011-07-25 DIAGNOSIS — I359 Nonrheumatic aortic valve disorder, unspecified: Secondary | ICD-10-CM

## 2011-08-03 ENCOUNTER — Telehealth: Payer: Self-pay | Admitting: *Deleted

## 2011-08-03 NOTE — Telephone Encounter (Signed)
Advised of results

## 2011-08-03 NOTE — Telephone Encounter (Signed)
Message copied by Burnell Blanks on Thu Aug 03, 2011  9:28 AM ------      Message from: Cassell Clement      Created: Tue Jul 18, 2011  5:18 PM       Please report.  The cholesterol is good.  The liver tests are normal.The kidneys are dry her so she needs to drink more water.  The blood sugar is 145.  Watch diet carefully.  Continue same medication

## 2011-08-03 NOTE — Progress Notes (Signed)
Advised of results

## 2011-08-03 NOTE — Telephone Encounter (Signed)
Message copied by Burnell Blanks on Thu Aug 03, 2011  9:27 AM ------      Message from: Cassell Clement      Created: Wed Jul 26, 2011  9:44 PM       Please report.  The aortic valve is still only moderately stenotic, not severe.  No surgery needed yet.

## 2011-08-03 NOTE — Telephone Encounter (Signed)
Advised of echo results 

## 2011-08-03 NOTE — Progress Notes (Signed)
Advised if results

## 2011-11-15 ENCOUNTER — Ambulatory Visit (INDEPENDENT_AMBULATORY_CARE_PROVIDER_SITE_OTHER): Payer: Medicare Other | Admitting: Cardiology

## 2011-11-15 ENCOUNTER — Encounter: Payer: Self-pay | Admitting: Cardiology

## 2011-11-15 ENCOUNTER — Other Ambulatory Visit (INDEPENDENT_AMBULATORY_CARE_PROVIDER_SITE_OTHER): Payer: Medicare Other | Admitting: *Deleted

## 2011-11-15 VITALS — BP 122/80 | HR 60 | Ht 64.0 in | Wt 217.0 lb

## 2011-11-15 DIAGNOSIS — I359 Nonrheumatic aortic valve disorder, unspecified: Secondary | ICD-10-CM

## 2011-11-15 DIAGNOSIS — I119 Hypertensive heart disease without heart failure: Secondary | ICD-10-CM

## 2011-11-15 DIAGNOSIS — E7800 Pure hypercholesterolemia, unspecified: Secondary | ICD-10-CM

## 2011-11-15 DIAGNOSIS — I350 Nonrheumatic aortic (valve) stenosis: Secondary | ICD-10-CM

## 2011-11-15 DIAGNOSIS — I10 Essential (primary) hypertension: Secondary | ICD-10-CM

## 2011-11-15 DIAGNOSIS — E785 Hyperlipidemia, unspecified: Secondary | ICD-10-CM

## 2011-11-15 LAB — BASIC METABOLIC PANEL
BUN: 22 mg/dL (ref 6–23)
Calcium: 9.4 mg/dL (ref 8.4–10.5)
Creatinine, Ser: 1.4 mg/dL — ABNORMAL HIGH (ref 0.4–1.2)
GFR: 45.22 mL/min — ABNORMAL LOW (ref >60.00)
Potassium: 4 mEq/L (ref 3.5–5.1)

## 2011-11-15 LAB — HEPATIC FUNCTION PANEL
ALT: 13 U/L (ref 0–35)
Alkaline Phosphatase: 33 U/L — ABNORMAL LOW (ref 39–117)
Bilirubin, Direct: 0 mg/dL (ref 0.0–0.3)
Total Protein: 7.7 g/dL (ref 6.0–8.3)

## 2011-11-15 LAB — LIPID PANEL
Cholesterol: 170 mg/dL (ref 0–200)
VLDL: 17 mg/dL (ref 0.0–40.0)

## 2011-11-15 NOTE — Assessment & Plan Note (Signed)
The patient has not been having any headaches or dizzy spells.  She has not been having any palpitations.

## 2011-11-15 NOTE — Progress Notes (Signed)
Arville Care Date of Birth:  Mar 08, 1931 [facility] [address] [place], [place]  [address] [phone]         Fax   [phone]  History of Present Illness: This pleasant 78 year old African American woman is seen for a scheduled four-month followup office visit.  She has a history of essential hypertension and hyperlipidemia.  She also has a known murmur of valvular aortic stenosis.  In March 2011 her aortic valve area was 0.9.  She has a history of normal systolic function with diastolic dysfunction.  She does have LVH.  Current Outpatient Prescriptions  Medication Sig Dispense Refill  . Acetaminophen (TYLENOL PO) Take 1 tablet by mouth as needed.        Marland Kitchen amLODipine (NORVASC) 10 MG tablet Take 1 tablet (10 mg total) by mouth daily.  30 tablet  11  . aspirin 81 MG tablet Take 81 mg by mouth daily.        . hydrochlorothiazide 25 MG tablet Take 1 tablet (25 mg total) by mouth daily.  30 tablet  11  . lisinopril (PRINIVIL,ZESTRIL) 20 MG tablet Take 1 tablet (20 mg total) by mouth 2 (two) times daily.  60 tablet  11  . metoprolol (LOPRESSOR) 50 MG tablet Take 25 mg by mouth 2 (two) times daily.        . rosuvastatin (CRESTOR) 10 MG tablet Taking 1/2 daily       . spironolactone (ALDACTONE) 25 MG tablet Take 1 tablet (25 mg total) by mouth 2 (two) times daily.  60 tablet  11    No Known Allergies  Patient Active Problem List  Diagnoses  . Hypertension  . Hyperlipidemia  . LVH (left ventricular hypertrophy)  . Aortic stenosis    History  Smoking status  . Never Smoker   Smokeless tobacco  . Not on file    History  Alcohol Use No    No family history on file.  Review of Systems: Constitutional: no fever chills diaphoresis or fatigue or change in weight.  Head and neck: no hearing loss, no epistaxis, no photophobia or visual disturbance. Respiratory: No cough, shortness of breath or wheezing. Cardiovascular: No chest pain peripheral  edema, palpitations. Gastrointestinal: No abdominal distention, no abdominal pain, no change in bowel habits hematochezia or melena. Genitourinary: No dysuria, no frequency, no urgency, no nocturia. Musculoskeletal:No arthralgias, no back pain, no gait disturbance or myalgias. Neurological: No dizziness, no headaches, no numbness, no seizures, no syncope, no weakness, no tremors. Hematologic: No lymphadenopathy, no easy bruising. Psychiatric: No confusion, no hallucinations, no sleep disturbance.    Physical Exam: Filed Vitals:   11/15/11 0850  BP: 122/80  Pulse: 60   the general appearance reveals a well-developed well-nourished woman in no distress. Pupils equal and reactive.   Extraocular Movements are full.  There is no scleral icterus.  The mouth and pharynx are normal.  The neck is supple.  The carotids reveal no bruits.  The jugular venous pressure is normal.  The thyroid is not enlarged.  There is no lymphadenopathy.  The chest is clear to percussion and auscultation. There are no rales or rhonchi. Expansion of the chest is symmetrical.  The heart reveals a grade 2/6 harsh systolic ejection murmur at the base radiating toward the neck.  No gallop or rub.  No abnormal lift or heave.  The carotid pulses very slightly slow. The abdomen is soft and nontender. Bowel sounds are normal. The liver and spleen are not  enlarged. There Are no abdominal masses. There are no bruits.  The pedal pulses are good.  There is no phlebitis or edema.  There is no cyanosis or clubbing. Strength is normal and symmetrical in all extremities.  There is no lateralizing weakness.  There are no sensory deficits.  The skin is warm and dry.  There is no rash.    Assessment / Plan: Continue on same meds.  Has occasional heartburn for which she will take over-the-counter antacids.  Blood work today is pending.  Recheck in 4 months for office visit and fasting lab work

## 2011-11-15 NOTE — Assessment & Plan Note (Signed)
The patient is not having any cardinal symptoms of aortic stenosis.

## 2011-11-15 NOTE — Patient Instructions (Signed)
Your physician recommends that you continue on your current medications as directed. Please refer to the Current Medication list given to you today. Your physician wants you to follow-up in: 4 month You will receive a reminder letter in the mail two months in advance. If you don't receive a letter, please call our office to schedule the follow-up appointment.  

## 2011-11-15 NOTE — Assessment & Plan Note (Signed)
Patient is on Crestor for her hypercholesterolemia.  She did not have any myalgias or side effects.  Lab work today is pending

## 2011-11-17 ENCOUNTER — Telehealth: Payer: Self-pay | Admitting: *Deleted

## 2011-11-17 NOTE — Telephone Encounter (Signed)
Message copied by Burnell Blanks on Fri Nov 17, 2011  9:56 AM ------      Message from: Cassell Clement      Created: Thu Nov 16, 2011  3:14 PM       The blood sugar and the kidney function tests are better.  The cholesterol is higher.  Stay on same medication and work harder on diet and weight loss.

## 2011-11-17 NOTE — Telephone Encounter (Signed)
Advised of labs 

## 2011-12-27 ENCOUNTER — Other Ambulatory Visit: Payer: Self-pay | Admitting: *Deleted

## 2011-12-27 MED ORDER — METOPROLOL TARTRATE 50 MG PO TABS: 25.0000 mg | ORAL_TABLET | Freq: Two times a day (BID) | ORAL | Status: DC

## 2011-12-27 NOTE — Telephone Encounter (Signed)
Refilled metoprolol 

## 2012-03-08 ENCOUNTER — Other Ambulatory Visit: Payer: Self-pay | Admitting: *Deleted

## 2012-03-08 DIAGNOSIS — E785 Hyperlipidemia, unspecified: Secondary | ICD-10-CM

## 2012-03-08 MED ORDER — ROSUVASTATIN CALCIUM 10 MG PO TABS: 5.0000 mg | ORAL_TABLET | Freq: Every day | ORAL | Status: DC

## 2012-03-20 ENCOUNTER — Encounter: Payer: Self-pay | Admitting: Cardiology

## 2012-03-20 ENCOUNTER — Other Ambulatory Visit: Payer: Medicare Other

## 2012-03-20 ENCOUNTER — Ambulatory Visit (INDEPENDENT_AMBULATORY_CARE_PROVIDER_SITE_OTHER): Payer: Medicare Other | Admitting: Cardiology

## 2012-03-20 VITALS — BP 138/66 | HR 70 | Ht 64.0 in | Wt 225.0 lb

## 2012-03-20 DIAGNOSIS — I359 Nonrheumatic aortic valve disorder, unspecified: Secondary | ICD-10-CM

## 2012-03-20 DIAGNOSIS — I119 Hypertensive heart disease without heart failure: Secondary | ICD-10-CM

## 2012-03-20 DIAGNOSIS — I350 Nonrheumatic aortic (valve) stenosis: Secondary | ICD-10-CM

## 2012-03-20 DIAGNOSIS — E785 Hyperlipidemia, unspecified: Secondary | ICD-10-CM

## 2012-03-20 DIAGNOSIS — E7800 Pure hypercholesterolemia, unspecified: Secondary | ICD-10-CM

## 2012-03-20 DIAGNOSIS — I10 Essential (primary) hypertension: Secondary | ICD-10-CM

## 2012-03-20 LAB — BASIC METABOLIC PANEL
BUN: 27 mg/dL — ABNORMAL HIGH (ref 6–23)
Calcium: 9.8 mg/dL (ref 8.4–10.5)
Chloride: 102 mEq/L (ref 96–112)
Creatinine, Ser: 1.5 mg/dL — ABNORMAL HIGH (ref 0.4–1.2)

## 2012-03-20 LAB — HEPATIC FUNCTION PANEL
AST: 21 U/L (ref 0–37)
Alkaline Phosphatase: 31 U/L — ABNORMAL LOW (ref 39–117)
Bilirubin, Direct: 0 mg/dL (ref 0.0–0.3)
Total Protein: 7.6 g/dL (ref 6.0–8.3)

## 2012-03-20 LAB — LIPID PANEL
Cholesterol: 111 mg/dL (ref 0–200)
LDL Cholesterol: 37 mg/dL (ref 0–99)

## 2012-03-20 NOTE — Progress Notes (Signed)
Quick Note:  Please report to patient. The recent labs are stable. Continue same medication and careful diet. ______ 

## 2012-03-20 NOTE — Assessment & Plan Note (Signed)
The patient has not been having any headaches or dizziness.  His not been having any symptoms of congestive heart failure.

## 2012-03-20 NOTE — Progress Notes (Signed)
Arville Care Date of Birth:  [birth year] [facility] [address] [place], [place]  [address] [phone]  Fax   [phone]  HPI: This pleasant 78 year old African American woman is seen for a four-month followup office visit.  She has a history of essential hypertension.  She has a murmur of aortic stenosis.  She had her last echo in March 2011.  He is not having any symptoms from her aortic stenosis.  Patient also has a history of hyperlipidemia.  She is not known to be diabetic.  She's had a history of diastolic dysfunction and LVH.  Current Outpatient Prescriptions  Medication Sig Dispense Refill  . Acetaminophen (TYLENOL PO) Take 1 tablet by mouth as needed.        Marland Kitchen amLODipine (NORVASC) 10 MG tablet Take 1 tablet (10 mg total) by mouth daily.  30 tablet  11  . aspirin 81 MG tablet Take 81 mg by mouth daily.        . hydrochlorothiazide 25 MG tablet Take 1 tablet (25 mg total) by mouth daily.  30 tablet  11  . lisinopril (PRINIVIL,ZESTRIL) 20 MG tablet Take 1 tablet (20 mg total) by mouth 2 (two) times daily.  60 tablet  11  . metoprolol (LOPRESSOR) 50 MG tablet Take 0.5 tablets (25 mg total) by mouth 2 (two) times daily.  30 tablet  11  . rosuvastatin (CRESTOR) 10 MG tablet Take 0.5 tablets (5 mg total) by mouth daily. Taking 1/2 daily  30 tablet  6  . spironolactone (ALDACTONE) 25 MG tablet Take 1 tablet (25 mg total) by mouth 2 (two) times daily.  60 tablet  11    No Known Allergies  Patient Active Problem List  Diagnoses  . Hypertension  . Hyperlipidemia  . LVH (left ventricular hypertrophy)  . Aortic stenosis    History  Smoking status  . Never Smoker   Smokeless tobacco  . Not on file    History  Alcohol Use No    No family history on file.  Review of Systems: The patient denies any heat or cold intolerance.  No weight gain or weight loss.  The patient denies headaches or blurry vision.  There is no cough or sputum production.   The patient denies dizziness.  There is no hematuria or hematochezia.  The patient denies any muscle aches or arthritis.  The patient denies any rash.  The patient denies frequent falling or instability.  There is no history of depression or anxiety.  All other systems were reviewed and are negative.   Physical Exam: Filed Vitals:   03/20/12 0946  BP: 138/66  Pulse: 70   the general appearance reveals a overweight woman in no distress.  She has gained 8 pounds since last visit.The head and neck exam reveals pupils equal and reactive.  Extraocular movements are full.  There is no scleral icterus.  The mouth and pharynx are normal.  The neck is supple.  The carotids reveal no bruits.  The jugular venous pressure is normal.  The  thyroid is not enlarged.  There is no lymphadenopathy.  The chest is clear to percussion and auscultation.  There are no rales or rhonchi.  Expansion of the chest is symmetrical.  The precordium is quiet.  The first heart sound is normal.  The second heart sound is physiologically split.  There is no  gallop rub or click.  There is a good 2/6 systolic ejection murmur of aortic stenosis at the  base There is no abnormal lift or heave.  The abdomen is soft and nontender.  The bowel sounds are normal.  The liver and spleen are not enlarged.  There are no abdominal masses.  There are no abdominal bruits.  Extremities reveal good pedal pulses.  There is no phlebitis or edema.  There is no cyanosis or clubbing.  Strength is normal and symmetrical in all extremities.  There is no lateralizing weakness.  There are no sensory deficits.  The skin is warm and dry.  There is no rash.      Assessment / Plan: Continue same medication.  Must lose weight.  Recheck in 4 months for followup office visit EKG and fasting lab work

## 2012-03-20 NOTE — Assessment & Plan Note (Signed)
The patient denies any exertional chest pain, increased dyspnea, or exertional dizziness or syncope to suggest worsening of her aortic stenosis

## 2012-03-20 NOTE — Assessment & Plan Note (Signed)
The patient has a past history of elevated cholesterol.  She is on Crestor 10 mg daily.  She is tolerating it well without myalgias or other side effects.  She reports that her sister who is one year older than herself died of a heart attack since we last saw her.

## 2012-03-20 NOTE — Patient Instructions (Signed)
Will obtain labs today and call you with the results   Your physician recommends that you continue on your current medications as directed. Please refer to the Current Medication list given to you today.  Your physician recommends that you schedule a follow-up appointment in: 4 months with fasting labs (lp/bmet/hfp) and EKG 

## 2012-03-21 ENCOUNTER — Telehealth: Payer: Self-pay | Admitting: *Deleted

## 2012-03-21 NOTE — Telephone Encounter (Signed)
Advised of labs 

## 2012-03-21 NOTE — Telephone Encounter (Signed)
Message copied by Burnell Blanks on Thu Mar 21, 2012  5:19 PM ------      Message from: Cassell Clement      Created: Wed Mar 20, 2012  8:58 PM       Please report to patient.  The recent labs are stable. Continue same medication and careful diet.

## 2012-05-27 ENCOUNTER — Other Ambulatory Visit: Payer: Self-pay | Admitting: *Deleted

## 2012-05-27 DIAGNOSIS — I119 Hypertensive heart disease without heart failure: Secondary | ICD-10-CM

## 2012-05-27 MED ORDER — HYDROCHLOROTHIAZIDE 25 MG PO TABS: 25.0000 mg | ORAL_TABLET | Freq: Every day | ORAL | Status: DC

## 2012-05-27 MED ORDER — AMLODIPINE BESYLATE 10 MG PO TABS: 10.0000 mg | ORAL_TABLET | Freq: Every day | ORAL | Status: DC

## 2012-05-27 NOTE — Telephone Encounter (Signed)
Refilled hctz and amlodipine

## 2012-05-28 ENCOUNTER — Other Ambulatory Visit: Payer: Self-pay | Admitting: Cardiology

## 2012-05-28 DIAGNOSIS — I119 Hypertensive heart disease without heart failure: Secondary | ICD-10-CM

## 2012-05-28 MED ORDER — LISINOPRIL 20 MG PO TABS: 20.0000 mg | ORAL_TABLET | Freq: Two times a day (BID) | ORAL | Status: DC

## 2012-07-19 ENCOUNTER — Other Ambulatory Visit (INDEPENDENT_AMBULATORY_CARE_PROVIDER_SITE_OTHER): Payer: Medicare Other

## 2012-07-19 ENCOUNTER — Ambulatory Visit (INDEPENDENT_AMBULATORY_CARE_PROVIDER_SITE_OTHER): Payer: Medicare Other | Admitting: Cardiology

## 2012-07-19 ENCOUNTER — Other Ambulatory Visit: Payer: Self-pay | Admitting: *Deleted

## 2012-07-19 ENCOUNTER — Encounter: Payer: Self-pay | Admitting: Cardiology

## 2012-07-19 VITALS — BP 128/78 | HR 57 | Ht 64.0 in | Wt 223.0 lb

## 2012-07-19 DIAGNOSIS — I10 Essential (primary) hypertension: Secondary | ICD-10-CM

## 2012-07-19 DIAGNOSIS — E7800 Pure hypercholesterolemia, unspecified: Secondary | ICD-10-CM

## 2012-07-19 DIAGNOSIS — I359 Nonrheumatic aortic valve disorder, unspecified: Secondary | ICD-10-CM

## 2012-07-19 DIAGNOSIS — I119 Hypertensive heart disease without heart failure: Secondary | ICD-10-CM

## 2012-07-19 DIAGNOSIS — E785 Hyperlipidemia, unspecified: Secondary | ICD-10-CM

## 2012-07-19 DIAGNOSIS — I350 Nonrheumatic aortic (valve) stenosis: Secondary | ICD-10-CM

## 2012-07-19 LAB — HEPATIC FUNCTION PANEL
AST: 24 U/L (ref 0–37)
Alkaline Phosphatase: 36 U/L — ABNORMAL LOW (ref 39–117)
Bilirubin, Direct: 0 mg/dL (ref 0.0–0.3)
Total Protein: 8.1 g/dL (ref 6.0–8.3)

## 2012-07-19 LAB — LIPID PANEL
Cholesterol: 116 mg/dL (ref 0–200)
LDL Cholesterol: 54 mg/dL (ref 0–99)
Total CHOL/HDL Ratio: 3

## 2012-07-19 LAB — BASIC METABOLIC PANEL
BUN: 32 mg/dL — ABNORMAL HIGH (ref 6–23)
CO2: 24 mEq/L (ref 19–32)
Chloride: 102 mEq/L (ref 96–112)
Creatinine, Ser: 1.5 mg/dL — ABNORMAL HIGH (ref 0.4–1.2)

## 2012-07-19 MED ORDER — SPIRONOLACTONE 25 MG PO TABS: 25.0000 mg | ORAL_TABLET | Freq: Two times a day (BID) | ORAL | Status: DC

## 2012-07-19 NOTE — Assessment & Plan Note (Signed)
No new symptoms of aortic stenosis.  Her last echocardiogram was in March 2011.  After her next office visit we will consider update of her echo

## 2012-07-19 NOTE — Assessment & Plan Note (Signed)
The patient is on Crestor.  She is not having any myalgias.

## 2012-07-19 NOTE — Progress Notes (Signed)
Quick Note:  Please report to patient. The recent labs are stable. Continue same medication and careful diet. Blood sugar is too high this time. She may be starting to get diabetes. Needs to watch carbs and lose weight again. Get A1C at nexft OV ______

## 2012-07-19 NOTE — Patient Instructions (Addendum)
Your physician recommends that you continue on your current medications as directed. Please refer to the Current Medication list given to you today.  Your physician wants you to follow-up in: 4 months with fasting labs (lp/bmet/hfp) You will receive a reminder letter in the mail two months in advance. If you don't receive a letter, please call our office to schedule the follow-up appointment.  

## 2012-07-19 NOTE — Assessment & Plan Note (Signed)
Blood pressure has been remaining stable on current therapy. 

## 2012-07-19 NOTE — Progress Notes (Signed)
Arville Care Date of Birth:  [birth year] [facility] [address] [place], [place]  [address] [phone]         Fax   [phone]  History of Present Illness: This pleasant 78 year old African American woman is seen for a four-month followup office visit. She has a history of essential hypertension. She has a murmur of aortic stenosis. She had her last echo in March 2011. He is not having any symptoms from her aortic stenosis. Patient also has a history of hyperlipidemia. She is not known to be diabetic. She's had a history of diastolic dysfunction and LVH.  She denies any chest pain or shortness of breath.  She's had no syncope.  She has rare mild dizzy spells.  She has hypercholesterolemia but not diabetes mellitus.  She has been compliant with her medication   Current Outpatient Prescriptions  Medication Sig Dispense Refill  . Acetaminophen (TYLENOL PO) Take 1 tablet by mouth as needed.        Marland Kitchen amLODipine (NORVASC) 10 MG tablet Take 1 tablet (10 mg total) by mouth daily.  30 tablet  11  . aspirin 81 MG tablet Take 81 mg by mouth daily.        . hydrochlorothiazide (HYDRODIURIL) 25 MG tablet Take 1 tablet (25 mg total) by mouth daily.  30 tablet  11  . lisinopril (PRINIVIL,ZESTRIL) 20 MG tablet Take 1 tablet (20 mg total) by mouth 2 (two) times daily.  60 tablet  11  . metoprolol (LOPRESSOR) 50 MG tablet Take 0.5 tablets (25 mg total) by mouth 2 (two) times daily.  30 tablet  11  . rosuvastatin (CRESTOR) 10 MG tablet Take 0.5 tablets (5 mg total) by mouth daily. Taking 1/2 daily  30 tablet  6  . spironolactone (ALDACTONE) 25 MG tablet Take 1 tablet (25 mg total) by mouth 2 (two) times daily.  60 tablet  11    No Known Allergies  Patient Active Problem List  Diagnosis  . Hypertension  . Hyperlipidemia  . LVH (left ventricular hypertrophy)  . Aortic stenosis    History  Smoking status  . Never Smoker   Smokeless tobacco  . Not on file     History  Alcohol Use No    No family history on file.  Review of Systems: Constitutional: no fever chills diaphoresis or fatigue or change in weight.  Head and neck: no hearing loss, no epistaxis, no photophobia or visual disturbance. Respiratory: No cough, shortness of breath or wheezing. Cardiovascular: No chest pain peripheral edema, palpitations. Gastrointestinal: No abdominal distention, no abdominal pain, no change in bowel habits hematochezia or melena. Genitourinary: No dysuria, no frequency, no urgency, no nocturia. Musculoskeletal:No arthralgias, no back pain, no gait disturbance or myalgias. Neurological: No dizziness, no headaches, no numbness, no seizures, no syncope, no weakness, no tremors. Hematologic: No lymphadenopathy, no easy bruising. Psychiatric: No confusion, no hallucinations, no sleep disturbance.    Physical Exam: Filed Vitals:   07/19/12 0854  BP: 128/78   the general appearance reveals a well-developed well-nourished woman in no distress.  He has lost 2 pounds since last visit.The head and neck exam reveals pupils equal and reactive.  Extraocular movements are full.  There is no scleral icterus.  The mouth and pharynx are normal.  The neck is supple.  The carotids reveal no bruits.  The jugular venous pressure is normal.  The  thyroid is not enlarged.  There is no lymphadenopathy.  The chest is clear  to percussion and auscultation.  There are no rales or rhonchi.  Expansion of the chest is symmetrical.  The precordium is quiet.  The first heart sound is normal.  The second heart sound is physiologically split.  There is a grade 2/6 systolic ejection murmur at the base.  No diastolic murmur.  No gallop or rub.  There is no abnormal lift or heave.  The abdomen is soft and nontender.  The bowel sounds are normal.  The liver and spleen are not enlarged.  There are no abdominal masses.  There are no abdominal bruits.  Extremities reveal good pedal pulses.  There  is no phlebitis or edema.  There is no cyanosis or clubbing.  Strength is normal and symmetrical in all extremities.  There is no lateralizing weakness.  There are no sensory deficits.  The skin is warm and dry.  There is no rash.  EKG shows sinus bradycardia and LVH with strain.  We do not have any prior EKGs available in Epic   Assessment / Plan: Continue same medication.  Recheck in 4 months for followup office visit and fasting lab work.  After that consider update of her echo

## 2012-07-22 ENCOUNTER — Telehealth: Payer: Self-pay | Admitting: *Deleted

## 2012-07-22 DIAGNOSIS — R739 Hyperglycemia, unspecified: Secondary | ICD-10-CM

## 2012-07-22 NOTE — Telephone Encounter (Signed)
Message copied by Burnell Blanks on Mon Jul 22, 2012  2:32 PM ------      Message from: Cassell Clement      Created: Fri Jul 19, 2012  2:50 PM       Please report to patient.  The recent labs are stable. Continue same medication and careful diet. Blood sugar is too high this time.  She may be starting to get diabetes.  Needs to watch carbs and lose weight again. Get A1C at nexft OV

## 2012-07-22 NOTE — Telephone Encounter (Signed)
Advised patient of lab results  

## 2012-11-04 ENCOUNTER — Encounter: Payer: Self-pay | Admitting: Cardiology

## 2012-11-04 ENCOUNTER — Ambulatory Visit (INDEPENDENT_AMBULATORY_CARE_PROVIDER_SITE_OTHER): Payer: Medicare Other | Admitting: Cardiology

## 2012-11-04 ENCOUNTER — Other Ambulatory Visit (INDEPENDENT_AMBULATORY_CARE_PROVIDER_SITE_OTHER): Payer: Medicare Other

## 2012-11-04 VITALS — BP 130/66 | HR 63 | Resp 18 | Ht 65.0 in | Wt 221.8 lb

## 2012-11-04 DIAGNOSIS — I10 Essential (primary) hypertension: Secondary | ICD-10-CM

## 2012-11-04 DIAGNOSIS — I359 Nonrheumatic aortic valve disorder, unspecified: Secondary | ICD-10-CM

## 2012-11-04 DIAGNOSIS — I350 Nonrheumatic aortic (valve) stenosis: Secondary | ICD-10-CM

## 2012-11-04 DIAGNOSIS — E7800 Pure hypercholesterolemia, unspecified: Secondary | ICD-10-CM

## 2012-11-04 DIAGNOSIS — R7309 Other abnormal glucose: Secondary | ICD-10-CM

## 2012-11-04 DIAGNOSIS — R739 Hyperglycemia, unspecified: Secondary | ICD-10-CM

## 2012-11-04 DIAGNOSIS — E785 Hyperlipidemia, unspecified: Secondary | ICD-10-CM

## 2012-11-04 DIAGNOSIS — I119 Hypertensive heart disease without heart failure: Secondary | ICD-10-CM

## 2012-11-04 DIAGNOSIS — R12 Heartburn: Secondary | ICD-10-CM

## 2012-11-04 DIAGNOSIS — R252 Cramp and spasm: Secondary | ICD-10-CM

## 2012-11-04 LAB — BASIC METABOLIC PANEL
CO2: 26 mEq/L (ref 19–32)
Chloride: 101 mEq/L (ref 96–112)
Creatinine, Ser: 1.4 mg/dL — ABNORMAL HIGH (ref 0.4–1.2)
Potassium: 4.2 mEq/L (ref 3.5–5.1)

## 2012-11-04 LAB — CBC WITH DIFFERENTIAL/PLATELET
Basophils Absolute: 0 10*3/uL (ref 0.0–0.1)
Eosinophils Absolute: 0.2 10*3/uL (ref 0.0–0.7)
Lymphocytes Relative: 28.3 % (ref 12.0–46.0)
MCHC: 32.7 g/dL (ref 30.0–36.0)
MCV: 89 fl (ref 78.0–100.0)
Monocytes Absolute: 0.4 10*3/uL (ref 0.1–1.0)
Neutrophils Relative %: 60.4 % (ref 43.0–77.0)
Platelets: 236 10*3/uL (ref 150.0–400.0)
RDW: 14.2 % (ref 11.5–14.6)

## 2012-11-04 LAB — HEPATIC FUNCTION PANEL
Albumin: 4.1 g/dL (ref 3.5–5.2)
Alkaline Phosphatase: 35 U/L — ABNORMAL LOW (ref 39–117)

## 2012-11-04 LAB — LIPID PANEL
LDL Cholesterol: 45 mg/dL (ref 0–99)
Total CHOL/HDL Ratio: 2
Triglycerides: 90 mg/dL (ref 0.0–149.0)

## 2012-11-04 MED ORDER — OMEPRAZOLE 40 MG PO CPDR: 40.0000 mg | DELAYED_RELEASE_CAPSULE | Freq: Every day | ORAL | Status: DC | PRN

## 2012-11-04 NOTE — Assessment & Plan Note (Signed)
Since last visit her blood pressure has been stable and she has not been having any headaches or dizziness

## 2012-11-04 NOTE — Patient Instructions (Signed)
Add Omeprazole 40 mg daily as needed for heartburn  Will obtain labs today and call you with the results (lp/bmet/hfp/cbc/a1c)  Your physician wants you to follow-up in: 4 months with fasting labs (lp/bmet/hfp)  You will receive a reminder letter in the mail two months in advance. If you don't receive a letter, please call our office to schedule the follow-up appointment.

## 2012-11-04 NOTE — Progress Notes (Signed)
Quick Note:  Please report to patient. The recent labs are stable. Continue same medication and careful diet. ______ 

## 2012-11-04 NOTE — Assessment & Plan Note (Signed)
The patient is on Crestor.  She is having some cramps in her legs and her arms.  These occur during the day rather than at night.  We may need to reduce her dose of Crestor and see if the cramps improve.  We are also checking serum potassium today.

## 2012-11-04 NOTE — Progress Notes (Signed)
Arville Care Date of Birth:  02-08-1931 [facility] [address] [place], [place]  [address] [phone]         Fax   [phone]  History of Present Illness: This pleasant 79 year old African American woman is seen for a four-month followup office visit. She has a history of essential hypertension. She has a murmur of aortic stenosis. She had her last echo in March 2011. He is not having any symptoms from her aortic stenosis. Patient also has a history of hyperlipidemia. She is not known to be diabetic.  However her last blood sugar was elevated at 173 .we are checking a hemoglobin A1c today .  She's had a history of diastolic dysfunction and LVH. She denies any chest pain or shortness of breath. She's had no syncope. She has rare mild dizzy spells. She has hypercholesterolemia . She has been compliant with her medication   Current Outpatient Prescriptions  Medication Sig Dispense Refill  . Acetaminophen (TYLENOL PO) Take 1 tablet by mouth as needed.        Marland Kitchen amLODipine (NORVASC) 10 MG tablet Take 1 tablet (10 mg total) by mouth daily.  30 tablet  11  . aspirin 81 MG tablet Take 81 mg by mouth daily.        . hydrochlorothiazide (HYDRODIURIL) 25 MG tablet Take 1 tablet (25 mg total) by mouth daily.  30 tablet  11  . lisinopril (PRINIVIL,ZESTRIL) 20 MG tablet Take 1 tablet (20 mg total) by mouth 2 (two) times daily.  60 tablet  11  . metoprolol (LOPRESSOR) 50 MG tablet Take 0.5 tablets (25 mg total) by mouth 2 (two) times daily.  30 tablet  11  . rosuvastatin (CRESTOR) 10 MG tablet Take 0.5 tablets (5 mg total) by mouth daily. Taking 1/2 daily  30 tablet  6  . spironolactone (ALDACTONE) 25 MG tablet Take 1 tablet (25 mg total) by mouth 2 (two) times daily.  60 tablet  11  . omeprazole (PRILOSEC) 40 MG capsule Take 1 capsule (40 mg total) by mouth daily as needed.  30 capsule  prn    No Known Allergies  Patient Active Problem List  Diagnosis  .  Hypertension  . Hyperlipidemia  . LVH (left ventricular hypertrophy)  . Aortic stenosis    History  Smoking status  . Never Smoker   Smokeless tobacco  . Not on file    History  Alcohol Use No    No family history on file.  Review of Systems: Constitutional: no fever chills diaphoresis or fatigue or change in weight.  Head and neck: no hearing loss, no epistaxis, no photophobia or visual disturbance. Respiratory: No cough, shortness of breath or wheezing. Cardiovascular: No chest pain peripheral edema, palpitations. Gastrointestinal: No abdominal distention, no abdominal pain, no change in bowel habits hematochezia or melena. Genitourinary: No dysuria, no frequency, no urgency, no nocturia. Musculoskeletal:No arthralgias, no back pain, no gait disturbance or myalgias. Neurological: No dizziness, no headaches, no numbness, no seizures, no syncope, no weakness, no tremors. Hematologic: No lymphadenopathy, no easy bruising. Psychiatric: No confusion, no hallucinations, no sleep disturbance.    Physical Exam: Filed Vitals:   11/04/12 0924  BP: 130/66  Pulse: 63  Resp: 18   the general appearance reveals a well-developed well-nourished woman in no distress.The head and neck exam reveals pupils equal and reactive.  Extraocular movements are full.  There is no scleral icterus.  The mouth and pharynx are normal.  The neck is  supple.  The carotids reveal no bruits.  The jugular venous pressure is normal.  The  thyroid is not enlarged.  There is no lymphadenopathy.  The chest is clear to percussion and auscultation.  There are no rales or rhonchi.  Expansion of the chest is symmetrical.  The precordium is quiet.  The first heart sound is normal.  The second heart sound is physiologically split.  There is no gallop rub or click.  There is a grade 2/6 systolic ejection murmur at the base which radiates toward the neck  There is no abnormal lift or heave.  The abdomen is soft and nontender.   The bowel sounds are normal.  The liver and spleen are not enlarged.  There are no abdominal masses.  There are no abdominal bruits.  Extremities reveal good pedal pulses.  There is no phlebitis or edema.  There is no cyanosis or clubbing.  Strength is normal and symmetrical in all extremities.  There is no lateralizing weakness.  There are no sensory deficits.  The skin is warm and dry.  There is no rash.     Assessment / Plan: Continue same medication.  Head omeprazole 40 mg one daily when necessary for heartburn.  We will check a CBC today since she is complaining of heartburn.  We will await results of lab work and then decide about empiric cutting back on her Crestor or not to see if it would help the cramps in her arms and legs. Recheck in 4 months for followup office visit lipid panel hepatic function panel and basal metabolic panel

## 2012-11-04 NOTE — Assessment & Plan Note (Signed)
The patient is not having any symptoms referable to her aortic stenosis.  She is having some symptoms of heartburn and we will give her a therapeutic trial of omeprazole

## 2012-11-08 ENCOUNTER — Telehealth: Payer: Self-pay | Admitting: Cardiology

## 2012-11-08 DIAGNOSIS — E119 Type 2 diabetes mellitus without complications: Secondary | ICD-10-CM

## 2012-11-08 MED ORDER — METFORMIN HCL 500 MG PO TABS: 500.0000 mg | ORAL_TABLET | Freq: Every day | ORAL | Status: DC

## 2012-11-08 NOTE — Telephone Encounter (Signed)
Advised of results and adding Metformin. Patient states she is already taking Crestor 5 mg daily, will forward to  Dr. Patty Sermons for review

## 2012-11-08 NOTE — Telephone Encounter (Signed)
Message copied by Burnell Blanks on Fri Nov 08, 2012 12:55 PM ------      Message from: Cassell Clement      Created: Mon Nov 04, 2012  9:01 PM       The cholesterol is very low now so decrease the crestor to 5 mg daily and see if cramps resolve.      The BS and the A1C show that she does indeed have diabetes.  Needs low carb diet and start metformin 500 mg daily for diabetes.

## 2012-11-08 NOTE — Telephone Encounter (Signed)
New Problem:    Patient called in wanting to know what the results of her latest lab draw.  Please call back.

## 2012-11-10 NOTE — Telephone Encounter (Signed)
Decrease crestor to every other day

## 2012-11-11 NOTE — Telephone Encounter (Signed)
Advised patient

## 2013-02-03 ENCOUNTER — Other Ambulatory Visit: Payer: Self-pay | Admitting: *Deleted

## 2013-02-03 MED ORDER — METOPROLOL TARTRATE 50 MG PO TABS: 25.0000 mg | ORAL_TABLET | Freq: Two times a day (BID) | ORAL | Status: DC

## 2013-03-05 ENCOUNTER — Ambulatory Visit (INDEPENDENT_AMBULATORY_CARE_PROVIDER_SITE_OTHER): Payer: Medicare Other | Admitting: Cardiology

## 2013-03-05 ENCOUNTER — Encounter: Payer: Self-pay | Admitting: Cardiology

## 2013-03-05 VITALS — BP 132/70 | HR 65 | Ht 64.0 in | Wt 207.4 lb

## 2013-03-05 DIAGNOSIS — I119 Hypertensive heart disease without heart failure: Secondary | ICD-10-CM

## 2013-03-05 DIAGNOSIS — I10 Essential (primary) hypertension: Secondary | ICD-10-CM

## 2013-03-05 DIAGNOSIS — E785 Hyperlipidemia, unspecified: Secondary | ICD-10-CM

## 2013-03-05 DIAGNOSIS — I359 Nonrheumatic aortic valve disorder, unspecified: Secondary | ICD-10-CM

## 2013-03-05 DIAGNOSIS — IMO0001 Reserved for inherently not codable concepts without codable children: Secondary | ICD-10-CM

## 2013-03-05 DIAGNOSIS — E7800 Pure hypercholesterolemia, unspecified: Secondary | ICD-10-CM

## 2013-03-05 DIAGNOSIS — E119 Type 2 diabetes mellitus without complications: Secondary | ICD-10-CM | POA: Insufficient documentation

## 2013-03-05 DIAGNOSIS — I350 Nonrheumatic aortic (valve) stenosis: Secondary | ICD-10-CM

## 2013-03-05 LAB — HEPATIC FUNCTION PANEL
AST: 16 U/L (ref 0–37)
Alkaline Phosphatase: 34 U/L — ABNORMAL LOW (ref 39–117)
Total Bilirubin: 0.6 mg/dL (ref 0.3–1.2)

## 2013-03-05 LAB — LIPID PANEL
LDL Cholesterol: 42 mg/dL (ref 0–99)
Total CHOL/HDL Ratio: 2

## 2013-03-05 LAB — BASIC METABOLIC PANEL
CO2: 26 mEq/L (ref 19–32)
Calcium: 9.8 mg/dL (ref 8.4–10.5)
Chloride: 104 mEq/L (ref 96–112)
Glucose, Bld: 113 mg/dL — ABNORMAL HIGH (ref 70–99)
Sodium: 138 mEq/L (ref 135–145)

## 2013-03-05 NOTE — Assessment & Plan Note (Signed)
Patient is not having symptoms of chest pain or congestive heart failure or exertional dizziness or syncope.  We will plan to get another echocardiogram to follow her aortic stenosis prior to her next office visit.

## 2013-03-05 NOTE — Assessment & Plan Note (Signed)
Blood pressure has been remaining stable on current therapy.  She has occasional ankle edema from her amlodipine.

## 2013-03-05 NOTE — Progress Notes (Signed)
[name] Date of Birth:  01-Apr-1931 [facility] [address] [place], [place]  [address] [phone]         Fax   [phone]  History of Present Illness: This pleasant 79 year old African American woman is seen for a four-month followup office visit. She has a history of essential hypertension. She has a murmur of aortic stenosis. She had her last echo in March 2011. He is not having any symptoms from her aortic stenosis. Patient also has a history of hyperlipidemia. She is not known to be diabetic.  She has mild diabetes and is on 1 metformin daily. She's had a history of diastolic dysfunction and LVH. She denies any chest pain or shortness of breath. She's had no syncope. She has rare mild dizzy spells. She has hypercholesterolemia . She has been compliant with her medication.  Since last visit her diet has been improved and she has lost 14 pounds intentionally.   Current Outpatient Prescriptions  Medication Sig Dispense Refill  . Acetaminophen (TYLENOL PO) Take 1 tablet by mouth as needed.        Marland Kitchen amLODipine (NORVASC) 10 MG tablet Take 1 tablet (10 mg total) by mouth daily.  30 tablet  11  . aspirin 81 MG tablet Take 81 mg by mouth daily.        . hydrochlorothiazide (HYDRODIURIL) 25 MG tablet Take 1 tablet (25 mg total) by mouth daily.  30 tablet  11  . lisinopril (PRINIVIL,ZESTRIL) 20 MG tablet Take 1 tablet (20 mg total) by mouth 2 (two) times daily.  60 tablet  11  . metFORMIN (GLUCOPHAGE) 500 MG tablet Take 1 tablet (500 mg total) by mouth daily with breakfast.  30 tablet  5  . metoprolol (LOPRESSOR) 50 MG tablet Take 0.5 tablets (25 mg total) by mouth 2 (two) times daily.  30 tablet  3  . omeprazole (PRILOSEC) 40 MG capsule Take 1 capsule (40 mg total) by mouth daily as needed.  30 capsule  prn  . rosuvastatin (CRESTOR) 10 MG tablet Take 5 mg by mouth as directed. Taking 1/2 every other day      . spironolactone (ALDACTONE) 25 MG tablet Take 1  tablet (25 mg total) by mouth 2 (two) times daily.  60 tablet  11   No current facility-administered medications for this visit.    No Known Allergies  Patient Active Problem List   Diagnosis Date Noted  . Hypertension     Priority: High  . Hyperlipidemia     Priority: High  . Aortic stenosis     Priority: Medium  . Type II or unspecified type diabetes mellitus without mention of complication, uncontrolled 03/05/2013  . LVH (left ventricular hypertrophy)     History  Smoking status  . Never Smoker   Smokeless tobacco  . Not on file    History  Alcohol Use No    No family history on file.  Review of Systems: Constitutional: no fever chills diaphoresis or fatigue or change in weight.  Head and neck: no hearing loss, no epistaxis, no photophobia or visual disturbance. Respiratory: No cough, shortness of breath or wheezing. Cardiovascular: No chest pain peripheral edema, palpitations. Gastrointestinal: No abdominal distention, no abdominal pain, no change in bowel habits hematochezia or melena. Genitourinary: No dysuria, no frequency, no urgency, no nocturia. Musculoskeletal:No arthralgias, no back pain, no gait disturbance or myalgias. Neurological: No dizziness, no headaches, no numbness, no seizures, no syncope, no weakness, no tremors. Hematologic: No  lymphadenopathy, no easy bruising. Psychiatric: No confusion, no hallucinations, no sleep disturbance.    Physical Exam: Filed Vitals:   03/05/13 0903  BP: 132/70  Pulse: 65   the general appearance reveals a well-developed well-nourished woman in no distress.  The head and neck exam reveals pupils equal and reactive.  Extraocular movements are full.  There is no scleral icterus.  The mouth and pharynx are normal.  The neck is supple.  The carotids reveal no bruits.  The jugular venous pressure is normal.  The  thyroid is not enlarged.  There is no lymphadenopathy.  The chest is clear to percussion and auscultation.   There are no rales or rhonchi.  Expansion of the chest is symmetrical.  The precordium is quiet.  The first heart sound is normal.  The second heart sound is physiologically split.  There is no  gallop rub or click.  There is a grade 2/6 basilar systolic murmur.  No diastolic murmur.  There is no abnormal lift or heave.  The abdomen is soft and nontender.  The bowel sounds are normal.  The liver and spleen are not enlarged.  There are no abdominal masses.  There are no abdominal bruits.  Extremities reveal good pedal pulses.  There is no phlebitis or edema.  There is no cyanosis or clubbing.  Strength is normal and symmetrical in all extremities.  There is no lateralizing weakness.  There are no sensory deficits.  The skin is warm and dry.  There is no rash.  I   Assessment / Plan: Continue same medication.  Lab work today pending.  Recheck in 4 months for office visit lipid panel hepatic function panel basal metabolic panel A1c and get 2-D echo prior to his next office visit

## 2013-03-05 NOTE — Progress Notes (Signed)
Quick Note:  Please report to patient. The recent labs are stable. Continue same medication and careful diet. Blood sugar is better. ______ 

## 2013-03-05 NOTE — Patient Instructions (Signed)
Will obtain labs today and call you with the results ([email or web address])  Your physician recommends that you continue on your current medications as directed. Please refer to the Current Medication list given to you today.  Your physician recommends that you schedule a follow-up appointment in: 4 months with fasting labs (lp/bmet/hfp/a1c)   Your physician has requested that you have an echocardiogram. Echocardiography is a painless test that uses sound waves to create images of your heart. It provides your doctor with information about the size and shape of your heart and how well your heart's chambers and valves are working. This procedure takes approximately one hour. There are no restrictions for this procedure. NEEDS ECHO FEW DAYS BEFORE ECHO

## 2013-03-05 NOTE — Assessment & Plan Note (Signed)
The patient has a history of hypercholesterolemia.  She is not having any side effects from her low dose Crestor.  We are checking lab work today.

## 2013-03-06 ENCOUNTER — Telehealth: Payer: Self-pay | Admitting: *Deleted

## 2013-03-06 NOTE — Telephone Encounter (Signed)
Advised patient of lab results  

## 2013-03-06 NOTE — Telephone Encounter (Signed)
Message copied by Burnell Blanks on Thu Mar 06, 2013 11:42 AM ------      Message from: Cassell Clement      Created: Wed Mar 05, 2013  8:02 PM       Please report to patient.  The recent labs are stable. Continue same medication and careful diet.  Blood sugar is better ------

## 2013-03-11 ENCOUNTER — Other Ambulatory Visit: Payer: Self-pay | Admitting: *Deleted

## 2013-03-11 MED ORDER — ROSUVASTATIN CALCIUM 10 MG PO TABS: 5.0000 mg | ORAL_TABLET | ORAL | Status: DC

## 2013-03-19 ENCOUNTER — Encounter: Payer: Self-pay | Admitting: Cardiology

## 2013-05-07 ENCOUNTER — Telehealth: Payer: Self-pay | Admitting: *Deleted

## 2013-05-07 MED ORDER — METFORMIN HCL 500 MG PO TABS: 500.0000 mg | ORAL_TABLET | Freq: Every day | ORAL | Status: DC

## 2013-05-07 NOTE — Telephone Encounter (Signed)
Refilled refilled Metformin as requested by pharmacy

## 2013-06-09 ENCOUNTER — Other Ambulatory Visit: Payer: Self-pay | Admitting: *Deleted

## 2013-06-09 DIAGNOSIS — I119 Hypertensive heart disease without heart failure: Secondary | ICD-10-CM

## 2013-06-09 MED ORDER — HYDROCHLOROTHIAZIDE 25 MG PO TABS: 25.0000 mg | ORAL_TABLET | Freq: Every day | ORAL | Status: DC

## 2013-06-09 MED ORDER — AMLODIPINE BESYLATE 10 MG PO TABS: 10.0000 mg | ORAL_TABLET | Freq: Every day | ORAL | Status: DC

## 2013-06-09 MED ORDER — LISINOPRIL 20 MG PO TABS: 20.0000 mg | ORAL_TABLET | Freq: Two times a day (BID) | ORAL | Status: DC

## 2013-07-02 ENCOUNTER — Ambulatory Visit (HOSPITAL_COMMUNITY): Payer: Medicare Other | Attending: Cardiology

## 2013-07-02 DIAGNOSIS — E785 Hyperlipidemia, unspecified: Secondary | ICD-10-CM | POA: Insufficient documentation

## 2013-07-02 DIAGNOSIS — I10 Essential (primary) hypertension: Secondary | ICD-10-CM | POA: Insufficient documentation

## 2013-07-02 DIAGNOSIS — I350 Nonrheumatic aortic (valve) stenosis: Secondary | ICD-10-CM

## 2013-07-02 DIAGNOSIS — E119 Type 2 diabetes mellitus without complications: Secondary | ICD-10-CM | POA: Insufficient documentation

## 2013-07-02 DIAGNOSIS — I379 Nonrheumatic pulmonary valve disorder, unspecified: Secondary | ICD-10-CM | POA: Insufficient documentation

## 2013-07-02 DIAGNOSIS — R55 Syncope and collapse: Secondary | ICD-10-CM

## 2013-07-02 DIAGNOSIS — I359 Nonrheumatic aortic valve disorder, unspecified: Secondary | ICD-10-CM | POA: Insufficient documentation

## 2013-07-02 NOTE — Progress Notes (Signed)
Echocardiogram performed.  

## 2013-07-08 ENCOUNTER — Other Ambulatory Visit (HOSPITAL_COMMUNITY): Payer: Medicare Other

## 2013-07-09 ENCOUNTER — Other Ambulatory Visit: Payer: Medicare Other

## 2013-07-09 ENCOUNTER — Ambulatory Visit (INDEPENDENT_AMBULATORY_CARE_PROVIDER_SITE_OTHER): Payer: Medicare Other | Admitting: Cardiology

## 2013-07-09 VITALS — BP 146/64 | HR 54 | Ht 64.0 in | Wt 201.0 lb

## 2013-07-09 DIAGNOSIS — I359 Nonrheumatic aortic valve disorder, unspecified: Secondary | ICD-10-CM

## 2013-07-09 DIAGNOSIS — I350 Nonrheumatic aortic (valve) stenosis: Secondary | ICD-10-CM

## 2013-07-09 DIAGNOSIS — I10 Essential (primary) hypertension: Secondary | ICD-10-CM

## 2013-07-09 DIAGNOSIS — I119 Hypertensive heart disease without heart failure: Secondary | ICD-10-CM

## 2013-07-09 DIAGNOSIS — E785 Hyperlipidemia, unspecified: Secondary | ICD-10-CM

## 2013-07-09 LAB — LIPID PANEL
HDL: 49.9 mg/dL (ref >39.00)
Triglycerides: 90 mg/dL (ref 0.0–149.0)
VLDL: 18 mg/dL (ref 0.0–40.0)

## 2013-07-09 LAB — BASIC METABOLIC PANEL WITH GFR
BUN: 18 mg/dL (ref 6–23)
CO2: 26 meq/L (ref 19–32)
Calcium: 9.8 mg/dL (ref 8.4–10.5)
Chloride: 104 meq/L (ref 96–112)
Creatinine, Ser: 1.3 mg/dL — ABNORMAL HIGH (ref 0.4–1.2)
GFR: 49.78 mL/min — ABNORMAL LOW
Glucose, Bld: 114 mg/dL — ABNORMAL HIGH (ref 70–99)
Potassium: 4.3 meq/L (ref 3.5–5.1)
Sodium: 136 meq/L (ref 135–145)

## 2013-07-09 LAB — HEMOGLOBIN A1C: Hgb A1c MFr Bld: 6.6 % — ABNORMAL HIGH (ref 4.6–6.5)

## 2013-07-09 LAB — HEPATIC FUNCTION PANEL
ALT: 12 U/L (ref 0–35)
Albumin: 4.5 g/dL (ref 3.5–5.2)
Total Bilirubin: 0.5 mg/dL (ref 0.3–1.2)
Total Protein: 8.3 g/dL (ref 6.0–8.3)

## 2013-07-09 MED ORDER — METOPROLOL TARTRATE 50 MG PO TABS: 25.0000 mg | ORAL_TABLET | Freq: Two times a day (BID) | ORAL | Status: DC

## 2013-07-09 MED ORDER — SPIRONOLACTONE 25 MG PO TABS: 25.0000 mg | ORAL_TABLET | Freq: Two times a day (BID) | ORAL | Status: DC

## 2013-07-09 NOTE — Progress Notes (Signed)
Quick Note:  Please report to patient. The recent labs are stable. Continue same medication and careful diet. Diabetes test A1C is much better since her weight loss. ______

## 2013-07-09 NOTE — Patient Instructions (Signed)
Will obtain labs today and call you with the results (lp/bmet/hfp/a1c)  Your physician recommends that you continue on your current medications as directed. Please refer to the Current Medication list given to you today.  Your physician wants you to follow-up in: 4 months with fasting labs (lp/bmet/hfp/a1c)  You will receive a reminder letter in the mail two months in advance. If you don't receive a letter, please call our office to schedule the follow-up appointment.  

## 2013-07-09 NOTE — Assessment & Plan Note (Signed)
Blood work is pending today. 

## 2013-07-09 NOTE — Assessment & Plan Note (Signed)
Blood pressure is very good for her.  Continue same medication

## 2013-07-09 NOTE — Assessment & Plan Note (Signed)
The patient's aortic stenosis has worsened but is not at the point that she requires surgical correction yet.  She is not having any of the cardinal symptoms of aortic stenosis her EKG shows mild T-wave inversions in the inferior and lateral leads unchanged since 07/19/12

## 2013-07-09 NOTE — Progress Notes (Signed)
Arville Care Date of Birth:  July 12, 1931 [facility] [address] [place], [place]  [address] [phone]         Fax   [phone]  History of Present Illness: This pleasant 79 year old African American woman is seen for a four-month followup office visit. She has a history of essential hypertension. She has a murmur of aortic stenosis. She had her last echo September 2014 which showed a peak aortic gradient of 49 and a mean gradient of 28.  The patient is not having any symptoms from her aortic stenosis. Patient also has a history of hyperlipidemia. She is not known to be diabetic.  She has mild diabetes and is on 1 metformin daily. She's had a history of diastolic dysfunction and LVH. She denies any chest pain or shortness of breath. She's had no syncope. She has rare mild dizzy spells when she lies down at night. She has hypercholesterolemia . She has been compliant with her medication.  Since last visit her diet has been improved and she has lost 6 more pounds intentionally.   Current Outpatient Prescriptions  Medication Sig Dispense Refill  . Acetaminophen (TYLENOL PO) Take 1 tablet by mouth as needed.        Marland Kitchen amLODipine (NORVASC) 10 MG tablet Take 1 tablet (10 mg total) by mouth daily.  30 tablet  3  . aspirin 81 MG tablet Take 81 mg by mouth daily.        . hydrochlorothiazide (HYDRODIURIL) 25 MG tablet Take 1 tablet (25 mg total) by mouth daily.  30 tablet  3  . lisinopril (PRINIVIL,ZESTRIL) 20 MG tablet Take 1 tablet (20 mg total) by mouth 2 (two) times daily.  60 tablet  3  . metFORMIN (GLUCOPHAGE) 500 MG tablet Take 1 tablet (500 mg total) by mouth daily with breakfast.  30 tablet  11  . metoprolol (LOPRESSOR) 50 MG tablet Take 0.5 tablets (25 mg total) by mouth 2 (two) times daily.  30 tablet  11  . omeprazole (PRILOSEC) 40 MG capsule Take 1 capsule (40 mg total) by mouth daily as needed.  30 capsule  prn  . rosuvastatin (CRESTOR) 10 MG tablet Take  0.5 tablets (5 mg total) by mouth as directed. Taking 1/2 every other day  30 tablet  1  . spironolactone (ALDACTONE) 25 MG tablet Take 1 tablet (25 mg total) by mouth 2 (two) times daily.  60 tablet  11   No current facility-administered medications for this visit.    No Known Allergies  Patient Active Problem List   Diagnosis Date Noted  . Hypertension     Priority: High  . Hyperlipidemia     Priority: High  . Aortic stenosis     Priority: Medium  . Type II or unspecified type diabetes mellitus without mention of complication, uncontrolled 03/05/2013  . LVH (left ventricular hypertrophy)     History  Smoking status  . Never Smoker   Smokeless tobacco  . Not on file    History  Alcohol Use No    No family history on file.  Review of Systems: Constitutional: no fever chills diaphoresis or fatigue or change in weight.  Head and neck: no hearing loss, no epistaxis, no photophobia or visual disturbance. Respiratory: No cough, shortness of breath or wheezing. Cardiovascular: No chest pain peripheral edema, palpitations. Gastrointestinal: No abdominal distention, no abdominal pain, no change in bowel habits hematochezia or melena. Genitourinary: No dysuria, no frequency, no urgency, no nocturia.  Musculoskeletal:No arthralgias, no back pain, no gait disturbance or myalgias. Neurological: No dizziness, no headaches, no numbness, no seizures, no syncope, no weakness, no tremors. Hematologic: No lymphadenopathy, no easy bruising. Psychiatric: No confusion, no hallucinations, no sleep disturbance.    Physical Exam: Filed Vitals:   07/09/13 0936  BP: 146/64  Pulse: 54   the general appearance reveals a well-developed well-nourished woman in no distress.  The head and neck exam reveals pupils equal and reactive.  Extraocular movements are full.  There is no scleral icterus.  The mouth and pharynx are normal.  The neck is supple.  The carotids reveal no bruits.  The jugular  venous pressure is normal.  The  thyroid is not enlarged.  There is no lymphadenopathy.  The chest is clear to percussion and auscultation.  There are no rales or rhonchi.  Expansion of the chest is symmetrical.  The precordium is quiet.  The first heart sound is normal.  The second heart sound is physiologically split.  There is no  gallop rub or click.  There is a grade 2/6 basilar systolic murmur.  No diastolic murmur.  There is no abnormal lift or heave.  The abdomen is soft and nontender.  The bowel sounds are normal.  The liver and spleen are not enlarged.  There are no abdominal masses.  There are no abdominal bruits.  Extremities reveal good pedal pulses.  There is no phlebitis or edema.  There is no cyanosis or clubbing.  Strength is normal and symmetrical in all extremities.  There is no lateralizing weakness.  There are no sensory deficits.  The skin is warm and dry.  There is no rash.  I EKG shows sinus bradycardia with occasional premature beats and shows inferolateral T wave inversion unchanged since 07/19/12   Assessment / Plan: Continue same medication.  She has lost another 6 pounds.  She has lost 20 pounds over the past 2 visits.  This is beneficial for her.  She will return in 4 months for office visit lipid panel hepatic function panel basal metabolic panel and hemoglobin Z6X

## 2013-07-10 ENCOUNTER — Telehealth: Payer: Self-pay | Admitting: *Deleted

## 2013-07-10 NOTE — Telephone Encounter (Signed)
Message copied by Burnell Blanks on Thu Jul 10, 2013 11:37 AM ------      Message from: Cassell Clement      Created: Wed Jul 09, 2013  8:16 PM       Please report to patient.  The recent labs are stable. Continue same medication and careful diet. Diabetes test A1C is much better since her weight loss. ------

## 2013-07-10 NOTE — Telephone Encounter (Signed)
Advised patient of lab results  

## 2013-10-14 ENCOUNTER — Other Ambulatory Visit: Payer: Self-pay | Admitting: *Deleted

## 2013-10-14 DIAGNOSIS — I119 Hypertensive heart disease without heart failure: Secondary | ICD-10-CM

## 2013-10-14 MED ORDER — AMLODIPINE BESYLATE 10 MG PO TABS: 10.0000 mg | ORAL_TABLET | Freq: Every day | ORAL | Status: DC

## 2013-10-30 ENCOUNTER — Other Ambulatory Visit: Payer: Self-pay

## 2013-10-30 DIAGNOSIS — I119 Hypertensive heart disease without heart failure: Secondary | ICD-10-CM

## 2013-10-30 MED ORDER — HYDROCHLOROTHIAZIDE 25 MG PO TABS: 25.0000 mg | ORAL_TABLET | Freq: Every day | ORAL | Status: DC

## 2013-11-19 ENCOUNTER — Ambulatory Visit (INDEPENDENT_AMBULATORY_CARE_PROVIDER_SITE_OTHER): Payer: Medicare Other | Admitting: Cardiology

## 2013-11-19 ENCOUNTER — Encounter (INDEPENDENT_AMBULATORY_CARE_PROVIDER_SITE_OTHER): Payer: Self-pay

## 2013-11-19 ENCOUNTER — Encounter: Payer: Self-pay | Admitting: Cardiology

## 2013-11-19 VITALS — BP 173/78 | HR 68 | Ht 64.0 in | Wt 206.4 lb

## 2013-11-19 DIAGNOSIS — I350 Nonrheumatic aortic (valve) stenosis: Secondary | ICD-10-CM

## 2013-11-19 DIAGNOSIS — I10 Essential (primary) hypertension: Secondary | ICD-10-CM | POA: Insufficient documentation

## 2013-11-19 DIAGNOSIS — I359 Nonrheumatic aortic valve disorder, unspecified: Secondary | ICD-10-CM

## 2013-11-19 DIAGNOSIS — E785 Hyperlipidemia, unspecified: Secondary | ICD-10-CM

## 2013-11-19 DIAGNOSIS — IMO0001 Reserved for inherently not codable concepts without codable children: Secondary | ICD-10-CM

## 2013-11-19 DIAGNOSIS — E1165 Type 2 diabetes mellitus with hyperglycemia: Secondary | ICD-10-CM

## 2013-11-19 DIAGNOSIS — I119 Hypertensive heart disease without heart failure: Secondary | ICD-10-CM

## 2013-11-19 LAB — BASIC METABOLIC PANEL
BUN: 18 mg/dL (ref 6–23)
CHLORIDE: 102 meq/L (ref 96–112)
CO2: 28 meq/L (ref 19–32)
CREATININE: 1.3 mg/dL — AB (ref 0.4–1.2)
Calcium: 9.7 mg/dL (ref 8.4–10.5)
GFR: 50.18 mL/min — ABNORMAL LOW (ref >60.00)
GLUCOSE: 112 mg/dL — AB (ref 70–99)
Potassium: 4.1 mEq/L (ref 3.5–5.1)
Sodium: 138 mEq/L (ref 135–145)

## 2013-11-19 LAB — LIPID PANEL
CHOL/HDL RATIO: 3
CHOLESTEROL: 179 mg/dL (ref 0–200)
HDL: 51.3 mg/dL (ref >39.00)
LDL CALC: 109 mg/dL — AB (ref 0–99)
Triglycerides: 93 mg/dL (ref 0.0–149.0)
VLDL: 18.6 mg/dL (ref 0.0–40.0)

## 2013-11-19 LAB — HEPATIC FUNCTION PANEL
ALK PHOS: 43 U/L (ref 39–117)
ALT: 19 U/L (ref 0–35)
AST: 23 U/L (ref 0–37)
Albumin: 4.1 g/dL (ref 3.5–5.2)
BILIRUBIN TOTAL: 0.6 mg/dL (ref 0.3–1.2)
Bilirubin, Direct: 0.1 mg/dL (ref 0.0–0.3)
Total Protein: 7.7 g/dL (ref 6.0–8.3)

## 2013-11-19 LAB — HEMOGLOBIN A1C: Hgb A1c MFr Bld: 6.7 % — ABNORMAL HIGH (ref 4.6–6.5)

## 2013-11-19 MED ORDER — LISINOPRIL 20 MG PO TABS: 20.0000 mg | ORAL_TABLET | Freq: Two times a day (BID) | ORAL | Status: DC

## 2013-11-19 NOTE — Assessment & Plan Note (Signed)
The patient plans to join a group of ladies at her church when all of whom are trying to diet and lose weight.  She did this last year and it was quite successful.  The patient is tolerating low dose alternate day Crestor without myalgias or side effects.  Blood Work is pending today

## 2013-11-19 NOTE — Assessment & Plan Note (Signed)
Blood pressure has been remaining stable on current therapy.  She has not been any chest pain dizziness or syncope.

## 2013-11-19 NOTE — Progress Notes (Signed)
[name] Date of Birth:  24-Sep-1931 [address] [place], [place]  [address] [phone]         Fax   [phone]  History of Present Illness: This pleasant 80 year old African American woman is seen for a four-month followup office visit. She has a history of essential hypertension. She has a murmur of aortic stenosis. She had her last echo September 2014 which showed a peak aortic gradient of 49 and a mean gradient of 28.  The patient is not having any symptoms from her aortic stenosis. Patient also has a history of hyperlipidemia. She is not known to be diabetic.  She has mild diabetes and is on 1 metformin daily. She's had a history of diastolic dysfunction and LVH. She denies any chest pain or shortness of breath. She's had no syncope. She has rare mild dizzy spells when she lies down at night. She has hypercholesterolemia . She has been compliant with her medication.  She has gained 5 pounds since last visit.   Current Outpatient Prescriptions  Medication Sig Dispense Refill  . Acetaminophen (TYLENOL PO) Take 1 tablet by mouth as needed.        Marland Kitchen amLODipine (NORVASC) 10 MG tablet Take 1 tablet (10 mg total) by mouth daily.  30 tablet  3  . aspirin 81 MG tablet Take 81 mg by mouth daily.        . hydrochlorothiazide (HYDRODIURIL) 25 MG tablet Take 1 tablet (25 mg total) by mouth daily.  30 tablet  3  . lisinopril (PRINIVIL,ZESTRIL) 20 MG tablet Take 1 tablet (20 mg total) by mouth 2 (two) times daily.  60 tablet  12  . metFORMIN (GLUCOPHAGE) 500 MG tablet Take 1 tablet (500 mg total) by mouth daily with breakfast.  30 tablet  11  . metoprolol (LOPRESSOR) 50 MG tablet Take 0.5 tablets (25 mg total) by mouth 2 (two) times daily.  30 tablet  11  . omeprazole (PRILOSEC) 40 MG capsule Take 1 capsule (40 mg total) by mouth daily as needed.  30 capsule  prn  . rosuvastatin (CRESTOR) 10 MG tablet Take 0.5 tablets (5 mg total) by mouth as directed. Taking 1/2 every  other day  30 tablet  1  . spironolactone (ALDACTONE) 25 MG tablet Take 1 tablet (25 mg total) by mouth 2 (two) times daily.  60 tablet  11   No current facility-administered medications for this visit.    No Known Allergies  Patient Active Problem List   Diagnosis Date Noted  . Hypertension     Priority: High  . Hyperlipidemia     Priority: High  . Aortic stenosis     Priority: Medium  . Benign hypertensive heart disease without heart failure 11/19/2013  . Type II or unspecified type diabetes mellitus without mention of complication, uncontrolled 03/05/2013  . LVH (left ventricular hypertrophy)     History  Smoking status  . Never Smoker   Smokeless tobacco  . Not on file    History  Alcohol Use No    No family history on file.  Review of Systems: Constitutional: no fever chills diaphoresis or fatigue or change in weight.  Head and neck: no hearing loss, no epistaxis, no photophobia or visual disturbance. Respiratory: No cough, shortness of breath or wheezing. Cardiovascular: No chest pain peripheral edema, palpitations. Gastrointestinal: No abdominal distention, no abdominal pain, no change in bowel habits hematochezia or melena. Genitourinary: No dysuria, no frequency, no urgency, no  nocturia. Musculoskeletal:No arthralgias, no back pain, no gait disturbance or myalgias. Neurological: No dizziness, no headaches, no numbness, no seizures, no syncope, no weakness, no tremors. Hematologic: No lymphadenopathy, no easy bruising. Psychiatric: No confusion, no hallucinations, no sleep disturbance.    Physical Exam: Filed Vitals:   11/19/13 0938  BP: 173/78  Pulse: 68   the general appearance reveals a well-developed well-nourished woman in no distress.  The head and neck exam reveals pupils equal and reactive.  Extraocular movements are full.  There is no scleral icterus.  The mouth and pharynx are normal.  The neck is supple.  The carotids reveal no bruits.  The  jugular venous pressure is normal.  The  thyroid is not enlarged.  There is no lymphadenopathy.  The chest is clear to percussion and auscultation.  There are no rales or rhonchi.  Expansion of the chest is symmetrical.  The precordium is quiet.  The first heart sound is normal.  The second heart sound is physiologically split.  There is no  gallop rub or click.  There is a grade 2/6 basilar systolic murmur.  No diastolic murmur.  There is no abnormal lift or heave.  The abdomen is soft and nontender.  The bowel sounds are normal.  The liver and spleen are not enlarged.  There are no abdominal masses.  There are no abdominal bruits.  Extremities reveal good pedal pulses.  There is no phlebitis or edema.  There is no cyanosis or clubbing.  Strength is normal and symmetrical in all extremities.  There is no lateralizing weakness.  There are no sensory deficits.  The skin is warm and dry.  There is no rash.     Assessment / Plan: Continue same medicines.  Await today's lab work.  Recheck in 4 months for office visit A1c lipid panel hepatic function panel and basal metabolic panel.

## 2013-11-19 NOTE — Assessment & Plan Note (Signed)
Her weight is up 5 pounds since last visit.  We're checking lab work today.  She has not been having any hypoglycemic episodes.

## 2013-11-19 NOTE — Patient Instructions (Addendum)
Will obtain labs today and call you with the results (lp/bmet/hfp/a1c)  Your physician recommends that you continue on your current medications as directed. Please refer to the Current Medication list given to you today.  Your physician wants you to follow-up in: 4 months with fasting labs (lp/bmet/hfp)  You will receive a reminder letter in the mail two months in advance. If you don't receive a letter, please call our office to schedule the follow-up appointment.     

## 2013-11-20 ENCOUNTER — Telehealth: Payer: Self-pay | Admitting: *Deleted

## 2013-11-20 NOTE — Telephone Encounter (Signed)
Advised patient of lab results  

## 2013-11-20 NOTE — Telephone Encounter (Signed)
Message copied by Burnell BlanksPRATT, Clemence Lengyel B on Thu Nov 20, 2013  9:01 AM ------      Message from: Cassell ClementBRACKBILL, THOMAS      Created: Wed Nov 19, 2013  9:15 PM       LDL higher. Watch diet better.      BS better. ------

## 2013-12-26 ENCOUNTER — Other Ambulatory Visit: Payer: Self-pay

## 2013-12-26 DIAGNOSIS — R12 Heartburn: Secondary | ICD-10-CM

## 2013-12-26 MED ORDER — OMEPRAZOLE 40 MG PO CPDR: 40.0000 mg | DELAYED_RELEASE_CAPSULE | Freq: Every day | ORAL | Status: DC | PRN

## 2013-12-26 MED ORDER — ROSUVASTATIN CALCIUM 10 MG PO TABS: ORAL_TABLET | ORAL | Status: DC

## 2014-03-02 ENCOUNTER — Other Ambulatory Visit: Payer: Self-pay

## 2014-03-02 DIAGNOSIS — I119 Hypertensive heart disease without heart failure: Secondary | ICD-10-CM

## 2014-03-02 MED ORDER — HYDROCHLOROTHIAZIDE 25 MG PO TABS: 25.0000 mg | ORAL_TABLET | Freq: Every day | ORAL | Status: DC

## 2014-03-02 MED ORDER — AMLODIPINE BESYLATE 10 MG PO TABS: 10.0000 mg | ORAL_TABLET | Freq: Every day | ORAL | Status: DC

## 2014-03-24 ENCOUNTER — Ambulatory Visit (INDEPENDENT_AMBULATORY_CARE_PROVIDER_SITE_OTHER): Payer: Medicare Other | Admitting: Cardiology

## 2014-03-24 ENCOUNTER — Other Ambulatory Visit: Payer: Medicare Other

## 2014-03-24 ENCOUNTER — Encounter: Payer: Self-pay | Admitting: Cardiology

## 2014-03-24 VITALS — BP 153/68 | HR 61 | Ht 64.0 in | Wt 207.0 lb

## 2014-03-24 DIAGNOSIS — I350 Nonrheumatic aortic (valve) stenosis: Secondary | ICD-10-CM

## 2014-03-24 DIAGNOSIS — I119 Hypertensive heart disease without heart failure: Secondary | ICD-10-CM

## 2014-03-24 DIAGNOSIS — I359 Nonrheumatic aortic valve disorder, unspecified: Secondary | ICD-10-CM

## 2014-03-24 DIAGNOSIS — E1165 Type 2 diabetes mellitus with hyperglycemia: Secondary | ICD-10-CM

## 2014-03-24 DIAGNOSIS — IMO0001 Reserved for inherently not codable concepts without codable children: Secondary | ICD-10-CM

## 2014-03-24 DIAGNOSIS — E785 Hyperlipidemia, unspecified: Secondary | ICD-10-CM

## 2014-03-24 LAB — HEPATIC FUNCTION PANEL
ALT: 19 U/L (ref 0–35)
AST: 22 U/L (ref 0–37)
Albumin: 4.3 g/dL (ref 3.5–5.2)
Alkaline Phosphatase: 37 U/L — ABNORMAL LOW (ref 39–117)
BILIRUBIN DIRECT: 0.1 mg/dL (ref 0.0–0.3)
BILIRUBIN TOTAL: 0.5 mg/dL (ref 0.2–1.2)
Total Protein: 7.7 g/dL (ref 6.0–8.3)

## 2014-03-24 LAB — BASIC METABOLIC PANEL
BUN: 10 mg/dL (ref 6–23)
CHLORIDE: 103 meq/L (ref 96–112)
CO2: 28 mEq/L (ref 19–32)
Calcium: 10 mg/dL (ref 8.4–10.5)
Creatinine, Ser: 1.3 mg/dL — ABNORMAL HIGH (ref 0.4–1.2)
GFR: 50.58 mL/min — AB (ref >60.00)
Glucose, Bld: 119 mg/dL — ABNORMAL HIGH (ref 70–99)
POTASSIUM: 4.1 meq/L (ref 3.5–5.1)
SODIUM: 139 meq/L (ref 135–145)

## 2014-03-24 LAB — LIPID PANEL
Cholesterol: 145 mg/dL (ref 0–200)
HDL: 53.1 mg/dL (ref >39.00)
LDL CALC: 67 mg/dL (ref 0–99)
Total CHOL/HDL Ratio: 3
Triglycerides: 126 mg/dL (ref 0.0–149.0)
VLDL: 25.2 mg/dL (ref 0.0–40.0)

## 2014-03-24 LAB — HEMOGLOBIN A1C: Hgb A1c MFr Bld: 7 % — ABNORMAL HIGH (ref 4.6–6.5)

## 2014-03-24 NOTE — Assessment & Plan Note (Signed)
She has had no symptoms of CHF.  No chest pain.  She has mild edema during the day related to amlodipine which goes down at night.

## 2014-03-24 NOTE — Patient Instructions (Signed)
Your physician recommends that you continue on your current medications as directed. Please refer to the Current Medication list given to you today.  Your physician wants you to follow-up in: 4 months with fasting labs (lp/bmet/hfp) and ekg You will receive a reminder letter in the mail two months in advance. If you don't receive a letter, please call our office to schedule the follow-up appointment.  

## 2014-03-24 NOTE — Assessment & Plan Note (Addendum)
The patient has not been experiencing any symptoms from her aortic stenosis.  Her last echocardiogram was in 2014 and showed a mean gradient of 28

## 2014-03-24 NOTE — Progress Notes (Signed)
[name] Date of Birth:  Feb 17, 1931 [facility] [address] [place], [place]  [address] [phone]        Fax   [phone]   History of Present Illness: This pleasant 80 year old African American woman is seen for a four-month followup office visit. She has a history of essential hypertension. She has a murmur of aortic stenosis. She had her last echo September 2014 which showed a peak aortic gradient of 49 and a mean gradient of 28. The patient is not having any symptoms from her aortic stenosis. Patient also has a history of hyperlipidemia. She is not known to be diabetic. She has mild diabetes and is on 1 metformin daily. She's had a history of diastolic dysfunction and LVH. She denies any chest pain or shortness of breath. She's had no syncope. She has rare mild dizzy spells when she lies down at night. She has hypercholesterolemia . She has been compliant with her medication. She has gained 1 pound since last visit.   Current Outpatient Prescriptions  Medication Sig Dispense Refill  . Acetaminophen (TYLENOL PO) Take 1 tablet by mouth as needed.        Marland Kitchen. amLODipine (NORVASC) 10 MG tablet Take 1 tablet (10 mg total) by mouth daily.  30 tablet  3  . aspirin 81 MG tablet Take 81 mg by mouth daily.        . hydrochlorothiazide (HYDRODIURIL) 25 MG tablet Take 1 tablet (25 mg total) by mouth daily.  30 tablet  3  . lisinopril (PRINIVIL,ZESTRIL) 20 MG tablet Take 1 tablet (20 mg total) by mouth 2 (two) times daily.  60 tablet  12  . metFORMIN (GLUCOPHAGE) 500 MG tablet Take 1 tablet (500 mg total) by mouth daily with breakfast.  30 tablet  11  . metoprolol (LOPRESSOR) 50 MG tablet Take 0.5 tablets (25 mg total) by mouth 2 (two) times daily.  30 tablet  11  . omeprazole (PRILOSEC) 40 MG capsule Take 1 capsule (40 mg total) by mouth daily as needed.  30 capsule  6  . rosuvastatin (CRESTOR) 10 MG tablet Taking 1/2 every other day  30 tablet  3  .  spironolactone (ALDACTONE) 25 MG tablet Take 1 tablet (25 mg total) by mouth 2 (two) times daily.  60 tablet  11   No current facility-administered medications for this visit.    No Known Allergies  Patient Active Problem List   Diagnosis Date Noted  . Hypertension     Priority: High  . Hyperlipidemia     Priority: High  . Aortic stenosis     Priority: Medium  . Benign hypertensive heart disease without heart failure 11/19/2013  . Type II or unspecified type diabetes mellitus without mention of complication, uncontrolled 03/05/2013  . LVH (left ventricular hypertrophy)     History  Smoking status  . Never Smoker   Smokeless tobacco  . Not on file    History  Alcohol Use No    No family history on file.  Review of Systems: Constitutional: no fever chills diaphoresis or fatigue or change in weight.  Head and neck: no hearing loss, no epistaxis, no photophobia or visual disturbance. Respiratory: No cough, shortness of breath or wheezing. Cardiovascular: No chest pain peripheral edema, palpitations. Gastrointestinal: No abdominal distention, no abdominal pain, no change in bowel habits hematochezia or melena. Genitourinary: No dysuria, no frequency, no urgency, no nocturia. Musculoskeletal:No arthralgias, no back pain, no gait disturbance or  myalgias. Neurological: No dizziness, no headaches, no numbness, no seizures, no syncope, no weakness, no tremors. Hematologic: No lymphadenopathy, no easy bruising. Psychiatric: No confusion, no hallucinations, no sleep disturbance.    Physical Exam: Filed Vitals:   03/24/14 0916  BP: 153/68  Pulse: 61   the general appearance reveals a well-developed well-nourished elderly woman in no distress.The head and neck exam reveals pupils equal and reactive.  Extraocular movements are full.  There is no scleral icterus.  The mouth and pharynx are normal.  The neck is supple.  The carotids reveal no bruits.  The jugular venous pressure is  normal.  The  thyroid is not enlarged.  There is no lymphadenopathy.  The chest is clear to percussion and auscultation.  There are no rales or rhonchi.  Expansion of the chest is symmetrical.  The precordium is quiet.  The first heart sound is normal.  The second heart sound is physiologically split.  There is grade 3/6 harsh systolic ejection murmur at the base.  There is no abnormal lift or heave.  The abdomen is soft and nontender.  The bowel sounds are normal.  The liver and spleen are not enlarged.  There are no abdominal masses.  There are no abdominal bruits.  Extremities reveal good pedal pulses.  There is no phlebitis.  There is mild pedal edema.  There is no cyanosis or clubbing.  Strength is normal and symmetrical in all extremities.  There is no lateralizing weakness.  There are no sensory deficits.  The skin is warm and dry.  There is no rash.     Assessment / Plan: 1. mild aortic stenosis 2. essential hypertension without heart failure 3. diabetes mellitus 4. hypercholesterolemia  Disposition continue same medication.  Work harder on weight loss.  Recheck in 4 months for office visit EKG and fasting lab work including lipid panel hepatic function panel and basal metabolic panel.  Lab work today is pending.  Today we are also checking an A1c

## 2014-03-24 NOTE — Assessment & Plan Note (Signed)
She has not had any symptoms of hypoglycemia

## 2014-03-25 NOTE — Progress Notes (Signed)
Quick Note:  Please report to patient. The recent labs are stable. Continue same medication and careful diet. The A1c is a little higher. Work harder on diet and weight loss to help blood sugar ______

## 2014-06-15 ENCOUNTER — Other Ambulatory Visit: Payer: Self-pay | Admitting: *Deleted

## 2014-06-15 MED ORDER — METFORMIN HCL 500 MG PO TABS: 500.0000 mg | ORAL_TABLET | Freq: Every day | ORAL | Status: DC

## 2014-06-24 ENCOUNTER — Other Ambulatory Visit: Payer: Self-pay | Admitting: Cardiology

## 2014-07-16 ENCOUNTER — Encounter: Payer: Self-pay | Admitting: Cardiology

## 2014-07-16 ENCOUNTER — Ambulatory Visit (INDEPENDENT_AMBULATORY_CARE_PROVIDER_SITE_OTHER): Payer: Medicare Other | Admitting: Cardiology

## 2014-07-16 ENCOUNTER — Other Ambulatory Visit: Payer: Medicare Other

## 2014-07-16 VITALS — BP 126/60 | HR 57 | Ht 64.0 in | Wt 210.4 lb

## 2014-07-16 DIAGNOSIS — R739 Hyperglycemia, unspecified: Secondary | ICD-10-CM

## 2014-07-16 DIAGNOSIS — I350 Nonrheumatic aortic (valve) stenosis: Secondary | ICD-10-CM

## 2014-07-16 DIAGNOSIS — E1165 Type 2 diabetes mellitus with hyperglycemia: Secondary | ICD-10-CM

## 2014-07-16 DIAGNOSIS — R7309 Other abnormal glucose: Secondary | ICD-10-CM

## 2014-07-16 DIAGNOSIS — I119 Hypertensive heart disease without heart failure: Secondary | ICD-10-CM

## 2014-07-16 DIAGNOSIS — I359 Nonrheumatic aortic valve disorder, unspecified: Secondary | ICD-10-CM

## 2014-07-16 DIAGNOSIS — E785 Hyperlipidemia, unspecified: Secondary | ICD-10-CM

## 2014-07-16 DIAGNOSIS — IMO0001 Reserved for inherently not codable concepts without codable children: Secondary | ICD-10-CM

## 2014-07-16 LAB — BASIC METABOLIC PANEL
BUN: 28 mg/dL — AB (ref 6–23)
CO2: 25 mEq/L (ref 19–32)
Calcium: 10 mg/dL (ref 8.4–10.5)
Chloride: 101 mEq/L (ref 96–112)
Creatinine, Ser: 1.5 mg/dL — ABNORMAL HIGH (ref 0.4–1.2)
GFR: 44.2 mL/min — AB (ref >60.00)
Glucose, Bld: 128 mg/dL — ABNORMAL HIGH (ref 70–99)
POTASSIUM: 4 meq/L (ref 3.5–5.1)
SODIUM: 136 meq/L (ref 135–145)

## 2014-07-16 LAB — HEPATIC FUNCTION PANEL
ALT: 17 U/L (ref 0–35)
AST: 23 U/L (ref 0–37)
Albumin: 4.3 g/dL (ref 3.5–5.2)
Alkaline Phosphatase: 38 U/L — ABNORMAL LOW (ref 39–117)
Bilirubin, Direct: 0 mg/dL (ref 0.0–0.3)
TOTAL PROTEIN: 8.4 g/dL — AB (ref 6.0–8.3)
Total Bilirubin: 0.3 mg/dL (ref 0.2–1.2)

## 2014-07-16 LAB — LIPID PANEL
Cholesterol: 130 mg/dL (ref 0–200)
HDL: 39 mg/dL — ABNORMAL LOW (ref >39.00)
LDL CALC: 59 mg/dL (ref 0–99)
NonHDL: 91
TRIGLYCERIDES: 160 mg/dL — AB (ref 0.0–149.0)
Total CHOL/HDL Ratio: 3
VLDL: 32 mg/dL (ref 0.0–40.0)

## 2014-07-16 NOTE — Addendum Note (Signed)
Addended by: Celine Ahr on: 07/16/2014 08:56 AM   Modules accepted: Orders

## 2014-07-16 NOTE — Addendum Note (Signed)
Addended by: Regis Bill B on: 07/16/2014 01:25 PM   Modules accepted: Orders

## 2014-07-16 NOTE — Progress Notes (Signed)
[name] Date of Birth:  11-24-1930 [facility] [address] [place], [place]  [address] [phone]        Fax   [phone]   History of Present Illness: This pleasant 80 year old African American woman is seen for a four-month followup office visit. She has a history of essential hypertension. She has a murmur of aortic stenosis. She had her last echo September 2014 which showed a peak aortic gradient of 49 and a mean gradient of 28. The patient is not having any symptoms from her aortic stenosis. Patient also has a history of hyperlipidemia. . She has mild diabetes and is on 1 metformin daily. She's had a history of diastolic dysfunction and LVH. She denies any chest pain or shortness of breath. She's had no syncope. She has rare mild dizzy spells when she lies down at night. She has hypercholesterolemia . She has been compliant with her medication. She has gained 3 pound since last visit.  She recently returned from a cruise to the [place].  Current Outpatient Prescriptions  Medication Sig Dispense Refill  . Acetaminophen (TYLENOL PO) Take 1 tablet by mouth as needed.        Marland Kitchen amLODipine (NORVASC) 10 MG tablet Take 1 tablet (10 mg total) by mouth daily.  30 tablet  3  . aspirin 81 MG tablet Take 81 mg by mouth daily.        . hydrochlorothiazide (HYDRODIURIL) 25 MG tablet Take 1 tablet (25 mg total) by mouth daily.  30 tablet  3  . lisinopril (PRINIVIL,ZESTRIL) 20 MG tablet TAKE ONE TABLET BY MOUTH TWICE DAILY  180 tablet  0  . metFORMIN (GLUCOPHAGE) 500 MG tablet Take 1 tablet (500 mg total) by mouth daily with breakfast.  30 tablet  1  . metoprolol (LOPRESSOR) 50 MG tablet Take 0.5 tablets (25 mg total) by mouth 2 (two) times daily.  30 tablet  11  . omeprazole (PRILOSEC) 40 MG capsule Take 1 capsule (40 mg total) by mouth daily as needed.  30 capsule  6  . rosuvastatin (CRESTOR) 10 MG tablet Taking 1/2 every other day  30 tablet  3  .  spironolactone (ALDACTONE) 25 MG tablet Take 1 tablet (25 mg total) by mouth 2 (two) times daily.  60 tablet  11   No current facility-administered medications for this visit.    No Known Allergies  Patient Active Problem List   Diagnosis Date Noted  . Hypertension     Priority: High  . Hyperlipidemia     Priority: High  . Aortic stenosis     Priority: Medium  . Benign hypertensive heart disease without heart failure 11/19/2013  . Type II or unspecified type diabetes mellitus without mention of complication, uncontrolled 03/05/2013  . LVH (left ventricular hypertrophy)     History  Smoking status  . Never Smoker   Smokeless tobacco  . Not on file    History  Alcohol Use No    No family history on file.  Review of Systems: Constitutional: no fever chills diaphoresis or fatigue or change in weight.  Head and neck: no hearing loss, no epistaxis, no photophobia or visual disturbance. Respiratory: No cough, shortness of breath or wheezing. Cardiovascular: No chest pain peripheral edema, palpitations. Gastrointestinal: No abdominal distention, no abdominal pain, no change in bowel habits hematochezia or melena. Genitourinary: No dysuria, no frequency, no urgency, no nocturia. Musculoskeletal:No arthralgias, no back pain, no gait disturbance or myalgias. Neurological:  No dizziness, no headaches, no numbness, no seizures, no syncope, no weakness, no tremors. Hematologic: No lymphadenopathy, no easy bruising. Psychiatric: No confusion, no hallucinations, no sleep disturbance.    Physical Exam: Filed Vitals:   07/16/14 0821  BP: 126/60  Pulse: 57   the general appearance reveals a well-developed well-nourished elderly woman in no distress.The head and neck exam reveals pupils equal and reactive.  Extraocular movements are full.  There is no scleral icterus.  The mouth and pharynx are normal.  The neck is supple.  The carotids reveal no bruits.  The jugular venous pressure is  normal.  The  thyroid is not enlarged.  There is no lymphadenopathy.  The chest is clear to percussion and auscultation.  There are no rales or rhonchi.  Expansion of the chest is symmetrical.  The precordium is quiet.  The first heart sound is normal.  The second heart sound is physiologically split.  There is grade 3/6 harsh systolic ejection murmur at the base.  There is no abnormal lift or heave.  The abdomen is soft and nontender.  The bowel sounds are normal.  The liver and spleen are not enlarged.  There are no abdominal masses.  There are no abdominal bruits.  Extremities reveal good pedal pulses.  There is no phlebitis.  There is mild pedal edema.  There is no cyanosis or clubbing.  Strength is normal and symmetrical in all extremities.  There is no lateralizing weakness.  There are no sensory deficits.  The skin is warm and dry.  There is no rash.  EKG shows normal sinus rhythm and inferolateral T wave inversion unchanged since prior tracing.   Assessment / Plan: 1. moderate aortic stenosis by echocardiogram September 2014 2. essential hypertension without heart failure 3. diabetes mellitus 4. hypercholesterolemia  Disposition continue same medication.  Work harder on weight loss.  Recheck in 4 months for office visit  and fasting lab work including lipid panel hepatic function panel and basal metabolic panel.  And A1c.

## 2014-07-16 NOTE — Patient Instructions (Signed)
Will obtain labs today and call you with the results (lp/bmet/hfp)  Your physician recommends that you continue on your current medications as directed. Please refer to the Current Medication list given to you today.  Your physician wants you to follow-up in: 4 months with fasting labs (lp/bmet/hfp/a1c) You will receive a reminder letter in the mail two months in advance. If you don't receive a letter, please call our office to schedule the follow-up appointment.

## 2014-07-16 NOTE — Assessment & Plan Note (Signed)
She is not having any symptoms of congestive heart failure or angina pectoris.  No exertional dizziness or syncope.

## 2014-07-16 NOTE — Assessment & Plan Note (Signed)
She is not having any headaches or dizzy spells.  Blood pressures staying stable on current medication.

## 2014-07-16 NOTE — Assessment & Plan Note (Signed)
She is tolerating Crestor without side effects such as myalgias.  Lab work today is pending

## 2014-07-16 NOTE — Assessment & Plan Note (Signed)
She is not having any hypoglycemic episodes. 

## 2014-07-17 NOTE — Progress Notes (Signed)
Quick Note:  Please report to patient. The recent labs are stable. Continue same medication and careful diet. Kidneys are drier. Drink more water. ______ 

## 2014-07-23 ENCOUNTER — Other Ambulatory Visit: Payer: Self-pay | Admitting: *Deleted

## 2014-07-23 DIAGNOSIS — I119 Hypertensive heart disease without heart failure: Secondary | ICD-10-CM

## 2014-07-23 MED ORDER — AMLODIPINE BESYLATE 10 MG PO TABS: 10.0000 mg | ORAL_TABLET | Freq: Every day | ORAL | Status: DC

## 2014-07-23 MED ORDER — HYDROCHLOROTHIAZIDE 25 MG PO TABS: 25.0000 mg | ORAL_TABLET | Freq: Every day | ORAL | Status: DC

## 2014-07-23 MED ORDER — METOPROLOL TARTRATE 50 MG PO TABS
25.0000 mg | ORAL_TABLET | Freq: Two times a day (BID) | ORAL | Status: DC
Start: 2014-07-23 — End: 2014-10-21

## 2014-07-23 MED ORDER — SPIRONOLACTONE 25 MG PO TABS: 25.0000 mg | ORAL_TABLET | Freq: Two times a day (BID) | ORAL | Status: DC

## 2014-08-27 ENCOUNTER — Other Ambulatory Visit: Payer: Self-pay | Admitting: Cardiology

## 2014-09-23 ENCOUNTER — Other Ambulatory Visit: Payer: Self-pay | Admitting: Cardiology

## 2014-09-29 ENCOUNTER — Other Ambulatory Visit: Payer: Self-pay | Admitting: *Deleted

## 2014-09-29 DIAGNOSIS — I119 Hypertensive heart disease without heart failure: Secondary | ICD-10-CM

## 2014-09-29 MED ORDER — HYDROCHLOROTHIAZIDE 25 MG PO TABS: 25.0000 mg | ORAL_TABLET | Freq: Every day | ORAL | Status: DC

## 2014-10-21 ENCOUNTER — Other Ambulatory Visit: Payer: Self-pay | Admitting: Cardiology

## 2014-10-21 DIAGNOSIS — I119 Hypertensive heart disease without heart failure: Secondary | ICD-10-CM

## 2014-10-21 MED ORDER — METOPROLOL TARTRATE 50 MG PO TABS: 25.0000 mg | ORAL_TABLET | Freq: Two times a day (BID) | ORAL | Status: DC

## 2014-10-21 MED ORDER — AMLODIPINE BESYLATE 10 MG PO TABS: 10.0000 mg | ORAL_TABLET | Freq: Every day | ORAL | Status: DC

## 2014-11-17 ENCOUNTER — Ambulatory Visit: Payer: Medicare Other | Admitting: Cardiology

## 2014-11-17 ENCOUNTER — Other Ambulatory Visit: Payer: Medicare Other

## 2014-12-10 ENCOUNTER — Ambulatory Visit (INDEPENDENT_AMBULATORY_CARE_PROVIDER_SITE_OTHER): Payer: Medicare Other | Admitting: Cardiology

## 2014-12-10 ENCOUNTER — Encounter: Payer: Self-pay | Admitting: Cardiology

## 2014-12-10 ENCOUNTER — Other Ambulatory Visit (INDEPENDENT_AMBULATORY_CARE_PROVIDER_SITE_OTHER): Payer: Medicare Other | Admitting: *Deleted

## 2014-12-10 VITALS — BP 152/76 | HR 68 | Ht 64.0 in | Wt 211.8 lb

## 2014-12-10 DIAGNOSIS — I350 Nonrheumatic aortic (valve) stenosis: Secondary | ICD-10-CM

## 2014-12-10 DIAGNOSIS — E785 Hyperlipidemia, unspecified: Secondary | ICD-10-CM

## 2014-12-10 DIAGNOSIS — R739 Hyperglycemia, unspecified: Secondary | ICD-10-CM

## 2014-12-10 DIAGNOSIS — I119 Hypertensive heart disease without heart failure: Secondary | ICD-10-CM

## 2014-12-10 LAB — HEPATIC FUNCTION PANEL
ALK PHOS: 39 U/L (ref 39–117)
ALT: 19 U/L (ref 0–35)
AST: 23 U/L (ref 0–37)
Albumin: 4.2 g/dL (ref 3.5–5.2)
BILIRUBIN TOTAL: 0.4 mg/dL (ref 0.2–1.2)
Bilirubin, Direct: 0 mg/dL (ref 0.0–0.3)
TOTAL PROTEIN: 8.3 g/dL (ref 6.0–8.3)

## 2014-12-10 LAB — BASIC METABOLIC PANEL
BUN: 30 mg/dL — AB (ref 6–23)
CO2: 28 meq/L (ref 19–32)
CREATININE: 1.49 mg/dL — AB (ref 0.40–1.20)
Calcium: 10.1 mg/dL (ref 8.4–10.5)
Chloride: 105 mEq/L (ref 96–112)
GFR: 43.13 mL/min — AB (ref >60.00)
GLUCOSE: 144 mg/dL — AB (ref 70–99)
Potassium: 3.9 mEq/L (ref 3.5–5.1)
SODIUM: 140 meq/L (ref 135–145)

## 2014-12-10 LAB — HEMOGLOBIN A1C: Hgb A1c MFr Bld: 7.2 % — ABNORMAL HIGH (ref 4.6–6.5)

## 2014-12-10 LAB — LIPID PANEL
Cholesterol: 140 mg/dL (ref 0–200)
HDL: 50.2 mg/dL (ref >39.00)
LDL CALC: 67 mg/dL (ref 0–99)
NonHDL: 89.8
TRIGLYCERIDES: 114 mg/dL (ref 0.0–149.0)
Total CHOL/HDL Ratio: 3
VLDL: 22.8 mg/dL (ref 0.0–40.0)

## 2014-12-10 NOTE — Patient Instructions (Signed)
Your physician has requested that you have an echocardiogram. Echocardiography is a painless test that uses sound waves to create images of your heart. It provides your doctor with information about the size and shape of your heart and how well your heart's chambers and valves are working. This procedure takes approximately one hour. There are no restrictions for this procedure.  Your physician recommends that you continue on your current medications as directed. Please refer to the Current Medication list given to you today.  Your physician recommends that you schedule a follow-up appointment in: 4 months with fasting labs (lp/bmet/hfp) and ekg

## 2014-12-10 NOTE — Progress Notes (Signed)
Cardiology Office Note   Date:  12/10/2014   ID:  [name], DOB [birth year], MRN [id number]  PCP:  Kaleen Mask, MD  Cardiologist:   Cassell Clement, MD   No chief complaint on file.     History of Present Illness: [name] is a 81 y.o. female who presents for a four-month follow-up office visit  This pleasant 81 year old African American woman is seen for a four-month followup office visit. She has a history of essential hypertension. She has a murmur of aortic stenosis. She had her last echo September 2014 which showed a peak aortic gradient of 49 and a mean gradient of 28. The patient is not having any symptoms from her aortic stenosis. Patient also has a history of hyperlipidemia. . She has mild diabetes and is on 1 metformin daily. She's had a history of diastolic dysfunction and LVH. She denies any chest pain or shortness of breath. She's had no syncope. She has rare mild dizzy spells when she lies down at night. She has hypercholesterolemia . She has been compliant with her medication.  She is moderately careful with her diet and her weight is up only 1 pound since we last saw her.  Her last echocardiogram was in September 2015 which showed nonspecific ST-T wave changes in inferolateral leads.  Past Medical History  Diagnosis Date  . Hypertension   . Hyperlipidemia   . Exogenous obesity   . Aortic stenosis     last echo 12/2009 - AVA is .9cm - EF 55-60%  . LVH (left ventricular hypertrophy)     with diastolic dysfunction  . Heart murmur   . Dizziness     when standing too quickly - no syncope  . Family history of headaches     History reviewed. No pertinent past surgical history.   Current Outpatient Prescriptions  Medication Sig Dispense Refill  . Acetaminophen (TYLENOL PO) Take 1 tablet by mouth as needed.      Marland Kitchen amLODipine (NORVASC) 10 MG tablet Take 1 tablet (10 mg total) by mouth daily. 30 tablet 2  . aspirin 81 MG tablet Take 81 mg  by mouth daily.      . hydrochlorothiazide (HYDRODIURIL) 25 MG tablet Take 1 tablet (25 mg total) by mouth daily. 30 tablet 1  . lisinopril (PRINIVIL,ZESTRIL) 20 MG tablet TAKE ONE TABLET BY MOUTH TWICE DAILY 180 tablet 0  . metFORMIN (GLUCOPHAGE) 500 MG tablet TAKE 1 TABLET BY MOUTH DAILY WITH BREAKFAST 30 tablet 11  . metoprolol (LOPRESSOR) 50 MG tablet Take 0.5 tablets (25 mg total) by mouth 2 (two) times daily. 30 tablet 2  . omeprazole (PRILOSEC) 40 MG capsule Take 1 capsule (40 mg total) by mouth daily as needed. 30 capsule 6  . rosuvastatin (CRESTOR) 10 MG tablet Taking 1/2 every other day 30 tablet 3  . spironolactone (ALDACTONE) 25 MG tablet Take 1 tablet (25 mg total) by mouth 2 (two) times daily. 60 tablet 2   No current facility-administered medications for this visit.    Allergies:   Review of patient's allergies indicates no known allergies.    Social History:  The patient  reports that she has never smoked. She does not have any smokeless tobacco history on file. She reports that she does not drink alcohol or use illicit drugs.   Family History:  The patient's family history is not on file.    ROS:  Please see the history of present illness.   Otherwise, review of  systems are positive for none.   All other systems are reviewed and negative.    PHYSICAL EXAM: VS:  BP 152/76 mmHg  Pulse 68  Ht 5\' 4"  (1.626 m)  Wt 211 lb 12.8 oz (96.072 kg)  BMI 36.34 kg/m2 , BMI Body mass index is 36.34 kg/(m^2). GEN: Well nourished, well developed, in no acute distress HEENT: normal Neck: no JVD, carotid bruits, or masses Cardiac: RRR; there is a grade 3/6 harsh systolic ejection murmur at the aortic area.  No, rubs, or gallops, and there is 1+ pretibial edema. Respiratory:  clear to auscultation bilaterally, normal work of breathing GI: soft, nontender, nondistended, + BS MS: no deformity or atrophy Skin: warm and dry, no rash Neuro:  Strength and sensation are intact Psych:  euthymic mood, full affect   EKG:  EKG is not ordered today.    Recent Labs: 12/10/2014: ALT 19; BUN 30*; Creatinine 1.49*; Potassium 3.9; Sodium 140    Lipid Panel    Component Value Date/Time   CHOL 140 12/10/2014 0837   TRIG 114.0 12/10/2014 0837   HDL 50.20 12/10/2014 0837   CHOLHDL 3 12/10/2014 0837   VLDL 22.8 12/10/2014 0837   LDLCALC 67 12/10/2014 0837      Wt Readings from Last 3 Encounters:  12/10/14 211 lb 12.8 oz (96.072 kg)  07/16/14 210 lb 6.4 oz (95.437 kg)  03/24/14 207 lb (93.895 kg)         ASSESSMENT AND PLAN:  1. moderate aortic stenosis by echocardiogram September 2014 2. essential hypertension without heart failure 3. diabetes mellitus 4. hypercholesterolemia   Current medicines are reviewed at length with the patient today.  The patient does not have concerns regarding medicines.  The following changes have been made:  no change  Labs/ tests ordered today include: Lipid panel, hepatic function panel, and basal metabolic panel   Orders Placed This Encounter  Procedures  . Lipid panel  . Hepatic function panel  . Basic metabolic panel  . 2D Echocardiogram without contrast   The patient is to continue current medication.  Try to lose weight.  Strict diet.  We will plan to get another echocardiogram on her prior to her next visit to follow her aortic stenosis.  She is not yet having any cardinal symptoms of severe aortic stenosis.  Disposition:   FU with Dr. Patty SermonsBrackbill in 4 months for office visit EKG lipid panel hepatic function panel and basal metabolic panel   Signed, Cassell Clementhomas Erie Sica, MD  12/10/2014 3:47 PM    [facility] [address], [place], [place]  [address] Phone: [phone]; Fax: [phone]

## 2014-12-11 NOTE — Progress Notes (Signed)
Quick Note:  Please report to patient. The recent labs are stable. Continue same medication and careful diet. ______ 

## 2014-12-16 ENCOUNTER — Ambulatory Visit (HOSPITAL_COMMUNITY): Payer: Medicare Other | Attending: Cardiovascular Disease | Admitting: Cardiology

## 2014-12-16 DIAGNOSIS — E785 Hyperlipidemia, unspecified: Secondary | ICD-10-CM | POA: Diagnosis not present

## 2014-12-16 DIAGNOSIS — E119 Type 2 diabetes mellitus without complications: Secondary | ICD-10-CM | POA: Insufficient documentation

## 2014-12-16 DIAGNOSIS — I10 Essential (primary) hypertension: Secondary | ICD-10-CM | POA: Insufficient documentation

## 2014-12-16 DIAGNOSIS — I350 Nonrheumatic aortic (valve) stenosis: Secondary | ICD-10-CM | POA: Diagnosis not present

## 2014-12-16 DIAGNOSIS — E669 Obesity, unspecified: Secondary | ICD-10-CM | POA: Diagnosis not present

## 2014-12-16 NOTE — Progress Notes (Signed)
Echo performed. 

## 2014-12-25 ENCOUNTER — Other Ambulatory Visit: Payer: Self-pay | Admitting: Cardiology

## 2015-01-06 ENCOUNTER — Other Ambulatory Visit: Payer: Self-pay

## 2015-01-06 DIAGNOSIS — I119 Hypertensive heart disease without heart failure: Secondary | ICD-10-CM

## 2015-01-06 MED ORDER — SPIRONOLACTONE 25 MG PO TABS: 25.0000 mg | ORAL_TABLET | Freq: Two times a day (BID) | ORAL | Status: DC

## 2015-01-12 ENCOUNTER — Telehealth: Payer: Self-pay

## 2015-01-12 ENCOUNTER — Other Ambulatory Visit: Payer: Self-pay

## 2015-01-12 MED ORDER — METFORMIN HCL 500 MG PO TABS: 500.0000 mg | ORAL_TABLET | Freq: Every day | ORAL | Status: DC

## 2015-01-12 NOTE — Telephone Encounter (Signed)
Okay to refill metformin 

## 2015-01-22 ENCOUNTER — Other Ambulatory Visit: Payer: Self-pay | Admitting: Cardiology

## 2015-01-25 ENCOUNTER — Other Ambulatory Visit: Payer: Self-pay | Admitting: Cardiology

## 2015-03-09 ENCOUNTER — Other Ambulatory Visit: Payer: Self-pay | Admitting: Cardiology

## 2015-04-02 ENCOUNTER — Other Ambulatory Visit: Payer: Self-pay | Admitting: Cardiology

## 2015-04-05 ENCOUNTER — Other Ambulatory Visit: Payer: Self-pay | Admitting: Cardiology

## 2015-04-06 ENCOUNTER — Other Ambulatory Visit: Payer: Self-pay | Admitting: Cardiology

## 2015-04-20 ENCOUNTER — Encounter: Payer: Self-pay | Admitting: Cardiology

## 2015-04-20 ENCOUNTER — Ambulatory Visit (INDEPENDENT_AMBULATORY_CARE_PROVIDER_SITE_OTHER): Payer: Medicare Other | Admitting: Cardiology

## 2015-04-20 VITALS — BP 134/62 | HR 50 | Ht 64.0 in | Wt 204.0 lb

## 2015-04-20 DIAGNOSIS — R739 Hyperglycemia, unspecified: Secondary | ICD-10-CM

## 2015-04-20 DIAGNOSIS — E785 Hyperlipidemia, unspecified: Secondary | ICD-10-CM

## 2015-04-20 DIAGNOSIS — I119 Hypertensive heart disease without heart failure: Secondary | ICD-10-CM

## 2015-04-20 DIAGNOSIS — I350 Nonrheumatic aortic (valve) stenosis: Secondary | ICD-10-CM

## 2015-04-20 LAB — BASIC METABOLIC PANEL
BUN: 32 mg/dL — AB (ref 6–23)
CHLORIDE: 104 meq/L (ref 96–112)
CO2: 28 mEq/L (ref 19–32)
Calcium: 10.1 mg/dL (ref 8.4–10.5)
Creatinine, Ser: 1.32 mg/dL — ABNORMAL HIGH (ref 0.40–1.20)
GFR: 49.56 mL/min — AB (ref >60.00)
Glucose, Bld: 121 mg/dL — ABNORMAL HIGH (ref 70–99)
Potassium: 4.6 mEq/L (ref 3.5–5.1)
Sodium: 138 mEq/L (ref 135–145)

## 2015-04-20 LAB — HEPATIC FUNCTION PANEL
ALT: 14 U/L (ref 0–35)
AST: 19 U/L (ref 0–37)
Albumin: 4.4 g/dL (ref 3.5–5.2)
Alkaline Phosphatase: 39 U/L (ref 39–117)
Bilirubin, Direct: 0 mg/dL (ref 0.0–0.3)
TOTAL PROTEIN: 8 g/dL (ref 6.0–8.3)
Total Bilirubin: 0.4 mg/dL (ref 0.2–1.2)

## 2015-04-20 LAB — CBC WITH DIFFERENTIAL/PLATELET
BASOS ABS: 0 10*3/uL (ref 0.0–0.1)
Basophils Relative: 0.7 % (ref 0.0–3.0)
EOS ABS: 0.2 10*3/uL (ref 0.0–0.7)
Eosinophils Relative: 3.5 % (ref 0.0–5.0)
HCT: 37.3 % (ref 36.0–46.0)
Hemoglobin: 12.3 g/dL (ref 12.0–15.0)
LYMPHS ABS: 2.1 10*3/uL (ref 0.7–4.0)
LYMPHS PCT: 35.9 % (ref 12.0–46.0)
MCHC: 32.9 g/dL (ref 30.0–36.0)
MCV: 88.8 fl (ref 78.0–100.0)
Monocytes Absolute: 0.5 10*3/uL (ref 0.1–1.0)
Monocytes Relative: 8.5 % (ref 3.0–12.0)
Neutro Abs: 3.1 10*3/uL (ref 1.4–7.7)
Neutrophils Relative %: 51.4 % (ref 43.0–77.0)
PLATELETS: 214 10*3/uL (ref 150.0–400.0)
RBC: 4.2 Mil/uL (ref 3.87–5.11)
RDW: 13.7 % (ref 11.5–15.5)
WBC: 6 10*3/uL (ref 4.0–10.5)

## 2015-04-20 LAB — LIPID PANEL
CHOL/HDL RATIO: 3
Cholesterol: 139 mg/dL (ref 0–200)
HDL: 46.1 mg/dL (ref >39.00)
LDL Cholesterol: 74 mg/dL (ref 0–99)
NonHDL: 92.9
TRIGLYCERIDES: 94 mg/dL (ref 0.0–149.0)
VLDL: 18.8 mg/dL (ref 0.0–40.0)

## 2015-04-20 LAB — HEMOGLOBIN A1C: Hgb A1c MFr Bld: 6.6 % — ABNORMAL HIGH (ref 4.6–6.5)

## 2015-04-20 NOTE — Patient Instructions (Signed)
Medication Instructions:  STOP YOUR LISINOPRIL   Labwork: LP/BMET/HFP/CBC/A1C  Testing/Procedures: NONE  Follow-Up: Your physician wants you to follow-up in: 4 MONTH OV/EKG/BMET You will receive a reminder letter in the mail two months in advance. If you don't receive a letter, please call our office to schedule the follow-up appointment.

## 2015-04-20 NOTE — Progress Notes (Signed)
Cardiology Office Note   Date:  04/20/2015   ID:  [name], DOB 06/02/1931, MRN [id number]  PCP:  Kaleen MaskELKINS,WILSON OLIVER, MD  Cardiologist: Cassell Clementhomas Carrina Schoenberger MD  Chief Complaint  Patient presents with  . Labs Only      History of Present Illness: Jane CareGertrude J Malia is a 81 y.o. female who presents for a four-month follow-up visit  This pleasant 81 year old African American woman is seen for a four-month followup office visit. She has a history of essential hypertension. She has a murmur of aortic stenosis. She had her last echo on 12/16/14 which showed an ejection fraction of 60-65% and moderate to severe aortic stenosis with peak systolic gradient of 57 and mean gradient of 29. The patient is not having any symptoms from her aortic stenosis. Patient also has a history of hyperlipidemia. . She has mild diabetes and is on 1 metformin daily. She's had a history of diastolic dysfunction and LVH. She denies any chest pain or shortness of breath. She's had no syncope. She has rare mild dizzy spells when she lies down at night. She has hypercholesterolemia . She has been compliant with her medication. She has been more careful with her diet and her weight is down 7 pounds intentionally since last visit.  She and some of her fellow church women were on a 20 day fast, eating only vegetables. She mentioned that since last visit she has had several episodes of swelling and numbness around her lips.  This may be angioneurotic edema related to lisinopril.  We will stop her lisinopril. Past Medical History  Diagnosis Date  . Hypertension   . Hyperlipidemia   . Exogenous obesity   . Aortic stenosis     last echo 12/2009 - AVA is .9cm - EF 55-60%  . LVH (left ventricular hypertrophy)     with diastolic dysfunction  . Heart murmur   . Dizziness     when standing too quickly - no syncope  . Family history of headaches     History reviewed. No pertinent past surgical  history.   Current Outpatient Prescriptions  Medication Sig Dispense Refill  . Acetaminophen (TYLENOL PO) Take 1 tablet by mouth as needed (pain).     Marland Kitchen. amLODipine (NORVASC) 10 MG tablet TAKE 1 TABLET BY MOUTH DAILY 30 tablet 0  . aspirin 81 MG tablet Take 81 mg by mouth daily.      . CRESTOR 10 MG tablet TAKE 1/2 TABLET BY MOUTH EVERY OTHER DAY 45 tablet 1  . hydrochlorothiazide (HYDRODIURIL) 25 MG tablet TAKE 1 TABLET BY MOUTH ONCE DAILY 30 tablet 3  . metFORMIN (GLUCOPHAGE) 500 MG tablet Take 1 tablet (500 mg total) by mouth daily with breakfast. 30 tablet 11  . metoprolol (LOPRESSOR) 50 MG tablet TAKE 1/2 TABLET BY MOUTH TWICE DAILY 30 tablet 0  . omeprazole (PRILOSEC) 40 MG capsule Take 1 capsule (40 mg total) by mouth daily as needed. 30 capsule 6  . spironolactone (ALDACTONE) 25 MG tablet TAKE 1 TABLET BY MOUTH TWICE DAILY 60 tablet 0   No current facility-administered medications for this visit.    Allergies:   Lisinopril    Social History:  The patient  reports that she has never smoked. She does not have any smokeless tobacco history on file. She reports that she does not drink alcohol or use illicit drugs.   Family History:  The patient's family history includes Diabetes in her sister, sister, and sister; Heart attack in her  father and sister; Hypertension in her sister; Kidney failure in her sister.    ROS:  Please see the history of present illness.   Otherwise, review of systems are positive for none.   All other systems are reviewed and negative.    PHYSICAL EXAM: VS:  BP 134/62 mmHg  Pulse 50  Ht  (1.626 m)  Wt 204 lb (92.534 kg)  BMI 35.00 kg/m2 , BMI Body mass index is 35 kg/(m^2). GEN: Well nourished, well developed, in no acute distress HEENT: normal Neck: no JVD, carotid bruits, or masses Cardiac: RRR; Grade 3/6 systolic ejection murmur at the base.  No diastolic murmur. Respiratory:  clear to auscultation bilaterally, normal work of breathing GI:  soft, nontender, nondistended, + BS MS: no deformity or atrophy Skin: warm and dry, no rash Neuro:  Strength and sensation are intact Psych: euthymic mood, full affect   EKG:  EKG is ordered today. The ekg ordered today demonstrates  sinus bradycardia with occasional PACs.  T-wave abnormality consistent with inferolateral ischemia.  Since last tracing of 07/16/14, no significant change   Recent Labs: 04/20/2015: ALT 14; BUN 32*; Creatinine, Ser 1.32*; Hemoglobin 12.3; Platelets 214.0; Potassium 4.6; Sodium 138    Lipid Panel    Component Value Date/Time   CHOL 139 04/20/2015 1108   TRIG 94.0 04/20/2015 1108   HDL 46.10 04/20/2015 1108   CHOLHDL 3 04/20/2015 1108   VLDL 18.8 04/20/2015 1108   LDLCALC 74 04/20/2015 1108      Wt Readings from Last 3 Encounters:  04/20/15 204 lb (92.534 kg)  12/10/14 211 lb 12.8 oz (96.072 kg)  07/16/14 210 lb 6.4 oz (95.437 kg)       ASSESSMENT AND PLAN:  1. moderate aortic stenosis by echocardiogram  February 2016. essential hypertension without heart failure 3. diabetes mellitus 4. hypercholesterolemia 5.possible angioneurotic edema from lisinopril   Current medicines are reviewed at length with the patient today.  The patient does not have concerns regarding medicines.  The following changes have been made:  possible angioneurotic edema from lisinopril.  Stop lisinopril.   Labs/ tests ordered today include:   Orders Placed This Encounter  Procedures  . CBC with Differential/Platelet  . Hemoglobin A1c  . EKG 12-Lead     recheck in 4 months for office visit EKG and basal metabolic panel.  Today we are getting fasting lab work.    Karie Schwalbe MD 04/20/2015 1:35 PM    [facility] [address] [place], [place], [place]  [address] Phone: [phone]; Fax: [phone]

## 2015-04-20 NOTE — Progress Notes (Signed)
Quick Note:  Please report to patient. The recent labs are stable. Continue same medication and careful diet. A1C is much better. ______

## 2015-05-01 ENCOUNTER — Other Ambulatory Visit: Payer: Self-pay | Admitting: Cardiology

## 2015-08-17 ENCOUNTER — Other Ambulatory Visit: Payer: Self-pay | Admitting: Cardiology

## 2015-08-25 ENCOUNTER — Ambulatory Visit (INDEPENDENT_AMBULATORY_CARE_PROVIDER_SITE_OTHER): Payer: Medicare Other | Admitting: Cardiology

## 2015-08-25 ENCOUNTER — Encounter: Payer: Self-pay | Admitting: Cardiology

## 2015-08-25 VITALS — BP 140/70 | HR 63 | Ht 64.0 in | Wt 205.0 lb

## 2015-08-25 DIAGNOSIS — I350 Nonrheumatic aortic (valve) stenosis: Secondary | ICD-10-CM

## 2015-08-25 DIAGNOSIS — R739 Hyperglycemia, unspecified: Secondary | ICD-10-CM

## 2015-08-25 DIAGNOSIS — I119 Hypertensive heart disease without heart failure: Secondary | ICD-10-CM

## 2015-08-25 DIAGNOSIS — E785 Hyperlipidemia, unspecified: Secondary | ICD-10-CM | POA: Diagnosis not present

## 2015-08-25 LAB — HEPATIC FUNCTION PANEL
ALT: 13 U/L (ref 6–29)
AST: 22 U/L (ref 10–35)
Albumin: 4.2 g/dL (ref 3.6–5.1)
Alkaline Phosphatase: 39 U/L (ref 33–130)
BILIRUBIN TOTAL: 0.4 mg/dL (ref 0.2–1.2)
Bilirubin, Direct: 0.1 mg/dL (ref <=0.2)
Indirect Bilirubin: 0.3 mg/dL (ref 0.2–1.2)
Total Protein: 8 g/dL (ref 6.1–8.1)

## 2015-08-25 LAB — LIPID PANEL
CHOL/HDL RATIO: 2.7 ratio (ref <=5.0)
Cholesterol: 141 mg/dL (ref 125–200)
HDL: 52 mg/dL (ref >=46)
LDL CALC: 66 mg/dL (ref <130)
Triglycerides: 113 mg/dL (ref <150)
VLDL: 23 mg/dL (ref <30)

## 2015-08-25 LAB — BASIC METABOLIC PANEL
BUN: 24 mg/dL (ref 7–25)
CHLORIDE: 99 mmol/L (ref 98–110)
CO2: 25 mmol/L (ref 20–31)
Calcium: 9.8 mg/dL (ref 8.6–10.4)
Creat: 1.31 mg/dL — ABNORMAL HIGH (ref 0.60–0.88)
GLUCOSE: 141 mg/dL — AB (ref 65–99)
Potassium: 4.1 mmol/L (ref 3.5–5.3)
SODIUM: 136 mmol/L (ref 135–146)

## 2015-08-25 LAB — HEMOGLOBIN A1C
Hgb A1c MFr Bld: 7.1 % — ABNORMAL HIGH (ref <5.7)
Mean Plasma Glucose: 157 mg/dL — ABNORMAL HIGH (ref <117)

## 2015-08-25 NOTE — Patient Instructions (Signed)
Medication Instructions:  Your physician recommends that you continue on your current medications as directed. Please refer to the Current Medication list given to you today.  Labwork: lp/bmet/hfp/a1c  Testing/Procedures: none  Follow-Up: Your physician recommends that you schedule a follow-up appointment in: 6 month ov/ekg with Dawayne PatriciaLori G NP or Bing NeighborsScott W PA  If you need a refill on your cardiac medications before your next appointment, please call your pharmacy.

## 2015-08-25 NOTE — Progress Notes (Signed)
Cardiology Office Note   Date:  08/25/2015   ID:  [name], DOB [birth year], MRN [id number]  PCP:  Kaleen MaskELKINS,WILSON OLIVER, MD  Cardiologist: Cassell Clementhomas Anthany Thornhill MD  No chief complaint on file.     History of Present Illness: [name] is a 81 y.o. female who presents for scheduled follow-up visit.   This pleasant 81 year old African American woman is seen for a four-month followup office visit. She has a history of essential hypertension. She has a murmur of aortic stenosis. She had her last echo on 12/16/14 which showed an ejection fraction of 60-65% and moderate to severe aortic stenosis with peak systolic gradient of 57 and mean gradient of 29. The patient is not having any symptoms from her aortic stenosis. Patient also has a history of hyperlipidemia. . She has mild diabetes and is on 1 metformin daily. She's had a history of diastolic dysfunction and LVH. She denies any chest pain or shortness of breath. She's had no syncope. She has rare mild dizzy spells when she lies down at night. She has hypercholesterolemia . She has been compliant with her medication.   The patient has a history of suspected angioneurotic edema from lisinopril  Past Medical History  Diagnosis Date  . Hypertension   . Hyperlipidemia   . Exogenous obesity   . Aortic stenosis     last echo 12/2009 - AVA is .9cm - EF 55-60%  . LVH (left ventricular hypertrophy)     with diastolic dysfunction  . Heart murmur   . Dizziness     when standing too quickly - no syncope  . Family history of headaches     No past surgical history on file.   Current Outpatient Prescriptions  Medication Sig Dispense Refill  . Acetaminophen (TYLENOL PO) Take 1 tablet by mouth as needed (pain).     Marland Kitchen. amLODipine (NORVASC) 10 MG tablet TAKE 1 TABLET BY MOUTH DAILY 30 tablet 0  . aspirin 81 MG tablet Take 81 mg by mouth daily.      . CRESTOR 10 MG tablet TAKE 1/2 TABLET BY MOUTH EVERY OTHER DAY 45 tablet 3  .  hydrochlorothiazide (HYDRODIURIL) 25 MG tablet TAKE 1 TABLET BY MOUTH ONCE DAILY 30 tablet 3  . metFORMIN (GLUCOPHAGE) 500 MG tablet Take 1 tablet (500 mg total) by mouth daily with breakfast. 30 tablet 11  . metoprolol (LOPRESSOR) 50 MG tablet TAKE 1/2 TABLET BY MOUTH TWICE DAILY 30 tablet 3  . omeprazole (PRILOSEC) 40 MG capsule Take 40 mg by mouth daily as needed (indigestion).    Marland Kitchen. spironolactone (ALDACTONE) 25 MG tablet TAKE ONE TABLET BY MOUTH TWICE DAILY 60 tablet 3   No current facility-administered medications for this visit.    Allergies:   Lisinopril    Social History:  The patient  reports that she has never smoked. She does not have any smokeless tobacco history on file. She reports that she does not drink alcohol or use illicit drugs.   Family History:  The patient's family history includes Diabetes in her sister, sister, and sister; Heart attack in her father and sister; Hypertension in her sister; Kidney failure in her sister.    ROS:  Please see the history of present illness.   Otherwise, review of systems are positive for none.   All other systems are reviewed and negative.    PHYSICAL EXAM: VS:  BP 140/70 mmHg  Pulse 63  Ht 5\' 4"  (1.626 m)  Wt 205 lb (  92.987 kg)  BMI 35.17 kg/m2 , BMI Body mass index is 35.17 kg/(m^2). GEN: Well nourished, well developed, in no acute distress HEENT: normal Neck: no JVD, carotid bruits, or masses Cardiac: Normal sinus rhythm.  Grade 3/6 harsh systolic ejection murmur at the base.  No rubs, or gallops,no edema  Respiratory:  clear to auscultation bilaterally, normal work of breathing GI: soft, nontender, nondistended, + BS MS: no deformity or atrophy Skin: warm and dry, no rash Neuro:  Strength and sensation are intact Psych: euthymic mood, full affect   EKG:  EKG is ordered today. The ekg ordered today demonstrates normal sinus rhythm with occasional PAC.  Inferolateral ST-T wave changes unchanged from 04/20/15   Recent  Labs: 04/20/2015: ALT 14; BUN 32*; Creatinine, Ser 1.32*; Hemoglobin 12.3; Platelets 214.0; Potassium 4.6; Sodium 138    Lipid Panel    Component Value Date/Time   CHOL 139 04/20/2015 1108   TRIG 94.0 04/20/2015 1108   HDL 46.10 04/20/2015 1108   CHOLHDL 3 04/20/2015 1108   VLDL 18.8 04/20/2015 1108   LDLCALC 74 04/20/2015 1108      Wt Readings from Last 3 Encounters:  08/25/15 205 lb (92.987 kg)  04/20/15 204 lb (92.534 kg)  12/10/14 211 lb 12.8 oz (96.072 kg)        ASSESSMENT AND PLAN:   1. moderate aortic stenosis by echocardiogram February 2016.  2.essential hypertension without heart failure 3. diabetes mellitus 4. hypercholesterolemia 5.possible angioneurotic edema from lisinopril  6.  Premature atrial contractions  Current medicines are reviewed at length with the patient today. The patient does not have concerns regarding medicines.      Labs/ tests ordered today include:  Orders Placed This Encounter  Procedures  .   .   .            Disposition: Continue current medication.  We are checking lipid panel hepatic function panel basal metabolic panel and hemoglobin J4N today.  She will return in 6 months to see her new cardiologist.  Consider update of echo after next office visit.   Current medicines are reviewed at length with the patient today.  The patient does not have concerns regarding medicines.  The following changes have been made:  no change  Labs/ tests ordered today include:   Orders Placed This Encounter  Procedures  . Lipid panel  . Hepatic function panel  . Basic metabolic panel  . Hemoglobin A1c  . EKG 12-Lead       Signed, Cassell Clement MD 08/25/2015 1:33 PM    [facility] [address] [place], [place], [place]  [address] Phone: [phone]; Fax: [phone]

## 2015-08-26 ENCOUNTER — Other Ambulatory Visit: Payer: Self-pay | Admitting: Cardiology

## 2015-08-26 NOTE — Progress Notes (Signed)
Quick Note:  Please report to patient. The recent labs are stable. Continue same medication and careful diet. The A1c has gone up to 7.1. She needs to work harder on weight loss and diet and avoiding carbohydrates. ______

## 2016-01-31 ENCOUNTER — Other Ambulatory Visit: Payer: Self-pay | Admitting: Cardiology

## 2016-02-22 ENCOUNTER — Ambulatory Visit: Payer: Medicare Other | Admitting: Physician Assistant

## 2016-03-01 NOTE — Progress Notes (Signed)
Cardiology Office Note:    Date:  03/02/2016   ID:  [name], DOB 08/24/1931, MRN [id number]  PCP:  Kaleen Mask, MD  Cardiologist:  Dr. Cassell Clement >> will establish with Dr. Tonny Bollman   Electrophysiologist:  n/a  Referring MD: Kaleen Mask, *   Chief Complaint  Patient presents with  . Aortic Stenosis    Follow up    History of Present Illness:     [name] is a 82 y.o. female with a hx of HTN, aortic stenosis, HL, diabetes. She is allergic to lisinopril with suspected angioedema. Last echo in 2/16 with moderate to severe aortic stenosis and mean gradient 29 mmHg. Last seen by Dr. Patty Sermons.    Returns for follow-up. Here alone.  She is doing well.  Remains very active and does housework and yard work.  The patient denies chest pain, shortness of breath, syncope, orthopnea, PND or significant pedal edema.   This patients CHA2DS2-VASc Score and unadjusted Ischemic Stroke Rate (% per year) is equal to 7.2 % stroke rate/year from a score of 5 Above score calculated as 1 point each if present [CHF, HTN, DM, Vascular=MI/PAD/Aortic Plaque, Age if 65-74, or Female] Above score calculated as 2 points each if present [Age > 75, or Stroke/TIA/TE]    Past Medical History  Diagnosis Date  . Hypertension   . Hyperlipidemia   . Exogenous obesity   . Aortic stenosis     last echo 12/2009 - AVA is .9cm - EF 55-60%  . LVH (left ventricular hypertrophy)     with diastolic dysfunction  . Heart murmur   . Dizziness     when standing too quickly - no syncope  . Family history of headaches     No past surgical history on file.  Current Medications: Outpatient Prescriptions Prior to Visit  Medication Sig Dispense Refill  . Acetaminophen (TYLENOL PO) Take 1 tablet by mouth as needed (pain).     Marland Kitchen amLODipine (NORVASC) 10 MG tablet TAKE 1 TABLET BY MOUTH DAILY 30 tablet 0  . CRESTOR 10 MG tablet TAKE 1/2 TABLET BY MOUTH EVERY OTHER DAY 45  tablet 3  . hydrochlorothiazide (HYDRODIURIL) 25 MG tablet TAKE 1 TABLET BY MOUTH DAILY 30 tablet 11  . metFORMIN (GLUCOPHAGE) 500 MG tablet Take 1 tablet (500 mg total) by mouth daily with breakfast. 30 tablet 11  . metoprolol (LOPRESSOR) 50 MG tablet TAKE 1/2 TABLET BY MOUTH TWICE DAILY 30 tablet 11  . spironolactone (ALDACTONE) 25 MG tablet TAKE ONE TABLET BY MOUTH TWICE DAILY 60 tablet 11  . aspirin 81 MG tablet Take 81 mg by mouth daily.      Marland Kitchen omeprazole (PRILOSEC) 40 MG capsule Take 40 mg by mouth daily as needed (indigestion).     No facility-administered medications prior to visit.      Allergies:   Lisinopril   Social History   Social History  . Marital Status: Divorced    Spouse Name: N/A  . Number of Children: N/A  . Years of Education: N/A   Social History Main Topics  . Smoking status: Never Smoker   . Smokeless tobacco: None  . Alcohol Use: No  . Drug Use: No  . Sexual Activity: Not Asked   Other Topics Concern  . None   Social History Narrative     Family History:  The patient's family history includes Diabetes in her sister, sister, and sister; Heart attack in her father and sister; Hypertension  in her sister; Kidney failure in her sister.   ROS:   Please see the history of present illness.    ROS All other systems reviewed and are negative.   Physical Exam:    VS:  BP 140/60 mmHg  Pulse 82  Ht 5\' 4"  (1.626 m)  Wt 204 lb 1.9 oz (92.588 kg)  BMI 35.02 kg/m2   GEN: Well nourished, well developed, in no acute distress HEENT: normal Neck: no JVD, no masses Cardiac: Normal S1/S2, irreg irreg rhythm; 2/6 harsh systolic murmur RUSB, no rubs, or gallops, no edema;     Respiratory:  clear to auscultation bilaterally; no wheezing, rhonchi or rales GI: soft, nontender, nondistended MS: no deformity or atrophy Skin: warm and dry Neuro: No focal deficits  Psych: Alert and oriented x 3, normal affect  Wt Readings from Last 3 Encounters:  03/02/16 204 lb  1.9 oz (92.588 kg)  08/25/15 205 lb (92.987 kg)  04/20/15 204 lb (92.534 kg)      Studies/Labs Reviewed:     EKG:  EKG is   ordered today.  The ekg ordered today demonstrates Atrial fibrillation, HR 82, normal axis, diffuse T-wave inversions inferolaterally, QTc 404 ms. T-wave inversions appear similar to previous tracings. Atrial fibrillation is new  Recent Labs: 04/20/2015: Hemoglobin 12.3; Platelets 214.0 08/25/2015: ALT 13; BUN 24; Creat 1.31*; Potassium 4.1; Sodium 136   Recent Lipid Panel    Component Value Date/Time   CHOL 141 08/25/2015 0935   TRIG 113 08/25/2015 0935   HDL 52 08/25/2015 0935   CHOLHDL 2.7 08/25/2015 0935   VLDL 23 08/25/2015 0935   LDLCALC 66 08/25/2015 0935    Additional studies/ records that were reviewed today include:   Echo 12/16/14 Mild concentric LVH, EF 60-65%, normal wall motion, grade 2 diastolic dysfunction, moderate to severe aortic stenosis (mean gradient 29 mmHg, peak gradient 57 mmHg), mild AI, MAC, mild MR, mild LAE, mild to moderate TR, PASP 32 mmHg, trivial pericardial effusion   ASSESSMENT:     1. Aortic stenosis   2. Benign hypertensive heart disease without heart failure   3. Hyperlipidemia   4. Paroxysmal atrial fibrillation (HCC)   5. Gastroesophageal reflux disease, esophagitis presence not specified     PLAN:     In order of problems listed above:  1. Aortic stenosis - Last echo in 2/16 with mod to severe AS and mean 29 mmHg.  No symptoms to suggest worsening.  Now in AFib with CVR.  Will Arrange FU Echo.    2. HTN - BP controlled.   3. HL - Continue statin.  LDL in 11/16 was 66.  4. AFib - New onset.  She is asymptomatic.  CHADS2-VASc=5.  No hx of bleeding or falls.  She does not seem to be symptomatic.  We discussed the indications for and risks and benefits of anticoagulation.  We discussed the different options for anticoagulation including Coumadin and NOAC therapy.  She does not have valvular AFib and is a good  candidate for NOAC.  -  DC ASA  -  Eliquis 5 mg bid (Last Cr 1.31, weight > 60 kg)  -  BMET, CBC, TSH   -  Echo  -  FU 1 month.  Will discuss DCCV at that time to decide whether to pursue  5. GERD - Takes PPI prn.  Prilosec refilled.   Medication Adjustments/Labs and Tests Ordered: Current medicines are reviewed at length with the patient today.  Concerns regarding medicines are outlined  above.  Medication changes, Labs and Tests ordered today are outlined in the Patient Instructions noted below. Patient Instructions  Medication Instructions:  Your physician has recommended you make the following change in your medication:  1) STOP Aspirin 2) START Eliquis 5mg  twice daily. An Rx has been sent to your pharmacy Labwork: Bmet, Cbc, Tsh today Testing/Procedures: Your physician has requested that you have an echocardiogram. Echocardiography is a painless test that uses sound waves to create images of your heart. It provides your doctor with information about the size and shape of your heart and how well your heart's chambers and valves are working. This procedure takes approximately one hour. There are no restrictions for this procedure. Follow-Up: Your physician recommends that you schedule a follow-up appointment in: 4 weeks with Tereso Newcomer, PA a day that Dr.Cooper is in the office Any Other Special Instructions Will Be Listed Below (If Applicable). If you need a refill on your cardiac medications before your next appointment, please call your pharmacy.    Signed, Tereso Newcomer, PA-C  03/02/2016 9:31 AM    [facility] [address] [place], [place], [place]  [address] Phone: [phone]; Fax: [phone]

## 2016-03-02 ENCOUNTER — Telehealth: Payer: Self-pay

## 2016-03-02 ENCOUNTER — Encounter: Payer: Self-pay | Admitting: Physician Assistant

## 2016-03-02 ENCOUNTER — Ambulatory Visit (INDEPENDENT_AMBULATORY_CARE_PROVIDER_SITE_OTHER): Payer: Medicare Other | Admitting: Physician Assistant

## 2016-03-02 VITALS — BP 140/60 | HR 82 | Ht 64.0 in | Wt 204.1 lb

## 2016-03-02 DIAGNOSIS — E785 Hyperlipidemia, unspecified: Secondary | ICD-10-CM

## 2016-03-02 DIAGNOSIS — I480 Paroxysmal atrial fibrillation: Secondary | ICD-10-CM

## 2016-03-02 DIAGNOSIS — I350 Nonrheumatic aortic (valve) stenosis: Secondary | ICD-10-CM

## 2016-03-02 DIAGNOSIS — I119 Hypertensive heart disease without heart failure: Secondary | ICD-10-CM

## 2016-03-02 DIAGNOSIS — K219 Gastro-esophageal reflux disease without esophagitis: Secondary | ICD-10-CM

## 2016-03-02 LAB — CBC WITH DIFFERENTIAL/PLATELET
BASOS ABS: 65 {cells}/uL (ref 0–200)
Basophils Relative: 1 %
EOS ABS: 195 {cells}/uL (ref 15–500)
EOS PCT: 3 %
HEMATOCRIT: 41.8 % (ref 35.0–45.0)
HEMOGLOBIN: 14.2 g/dL (ref 11.7–15.5)
LYMPHS ABS: 2600 {cells}/uL (ref 850–3900)
Lymphocytes Relative: 40 %
MCH: 29.6 pg (ref 27.0–33.0)
MCHC: 34 g/dL (ref 32.0–36.0)
MCV: 87.3 fL (ref 80.0–100.0)
MONO ABS: 715 {cells}/uL (ref 200–950)
MPV: 9.7 fL (ref 7.5–12.5)
Monocytes Relative: 11 %
NEUTROS ABS: 2925 {cells}/uL (ref 1500–7800)
NEUTROS PCT: 45 %
Platelets: 313 10*3/uL (ref 140–400)
RBC: 4.79 MIL/uL (ref 3.80–5.10)
RDW: 13.9 % (ref 11.0–15.0)
WBC: 6.5 10*3/uL (ref 3.8–10.8)

## 2016-03-02 LAB — BASIC METABOLIC PANEL
BUN: 38 mg/dL — ABNORMAL HIGH (ref 7–25)
CHLORIDE: 101 mmol/L (ref 98–110)
CO2: 23 mmol/L (ref 20–31)
Calcium: 10.6 mg/dL — ABNORMAL HIGH (ref 8.6–10.4)
Creat: 1.51 mg/dL — ABNORMAL HIGH (ref 0.60–0.88)
GLUCOSE: 146 mg/dL — AB (ref 65–99)
POTASSIUM: 4.2 mmol/L (ref 3.5–5.3)
SODIUM: 135 mmol/L (ref 135–146)

## 2016-03-02 LAB — TSH: TSH: 14.56 m[IU]/L — AB

## 2016-03-02 MED ORDER — OMEPRAZOLE 40 MG PO CPDR: 40.0000 mg | DELAYED_RELEASE_CAPSULE | Freq: Every day | ORAL | Status: DC | PRN

## 2016-03-02 MED ORDER — APIXABAN 5 MG PO TABS: 5.0000 mg | ORAL_TABLET | Freq: Two times a day (BID) | ORAL | Status: DC

## 2016-03-02 NOTE — Patient Instructions (Addendum)
Medication Instructions:  Your physician has recommended you make the following change in your medication:  1) STOP Aspirin 2) START Eliquis 5mg  twice daily. An Rx has been sent to your pharmacy Labwork: Bmet, Cbc, Tsh today Testing/Procedures: Your physician has requested that you have an echocardiogram. Echocardiography is a painless test that uses sound waves to create images of your heart. It provides your doctor with information about the size and shape of your heart and how well your heart's chambers and valves are working. This procedure takes approximately one hour. There are no restrictions for this procedure. Follow-Up: Your physician recommends that you schedule a follow-up appointment in: 4 weeks with Tereso NewcomerScott Weaver, PA a day that Dr.Cooper is in the office Any Other Special Instructions Will Be Listed Below (If Applicable). If you need a refill on your cardiac medications before your next appointment, please call your pharmacy.

## 2016-03-02 NOTE — Telephone Encounter (Signed)
Prior auth for Eliquis 5 mg obtained from [facility], through 10/22/2016. PA- [id number]. Pharmacy notified.

## 2016-03-03 ENCOUNTER — Telehealth: Payer: Self-pay | Admitting: *Deleted

## 2016-03-06 ENCOUNTER — Ambulatory Visit (HOSPITAL_COMMUNITY): Payer: Medicare Other | Attending: Cardiovascular Disease

## 2016-03-06 ENCOUNTER — Other Ambulatory Visit: Payer: Self-pay

## 2016-03-06 ENCOUNTER — Encounter: Payer: Self-pay | Admitting: Physician Assistant

## 2016-03-06 DIAGNOSIS — E119 Type 2 diabetes mellitus without complications: Secondary | ICD-10-CM | POA: Diagnosis not present

## 2016-03-06 DIAGNOSIS — I119 Hypertensive heart disease without heart failure: Secondary | ICD-10-CM | POA: Insufficient documentation

## 2016-03-06 DIAGNOSIS — I071 Rheumatic tricuspid insufficiency: Secondary | ICD-10-CM | POA: Insufficient documentation

## 2016-03-06 DIAGNOSIS — I350 Nonrheumatic aortic (valve) stenosis: Secondary | ICD-10-CM

## 2016-03-06 DIAGNOSIS — I340 Nonrheumatic mitral (valve) insufficiency: Secondary | ICD-10-CM | POA: Insufficient documentation

## 2016-03-06 DIAGNOSIS — E669 Obesity, unspecified: Secondary | ICD-10-CM | POA: Diagnosis not present

## 2016-03-06 DIAGNOSIS — I352 Nonrheumatic aortic (valve) stenosis with insufficiency: Secondary | ICD-10-CM | POA: Insufficient documentation

## 2016-03-06 DIAGNOSIS — E785 Hyperlipidemia, unspecified: Secondary | ICD-10-CM | POA: Diagnosis not present

## 2016-03-06 DIAGNOSIS — I313 Pericardial effusion (noninflammatory): Secondary | ICD-10-CM | POA: Diagnosis not present

## 2016-03-06 DIAGNOSIS — I480 Paroxysmal atrial fibrillation: Secondary | ICD-10-CM

## 2016-03-06 DIAGNOSIS — Z6835 Body mass index (BMI) 35.0-35.9, adult: Secondary | ICD-10-CM | POA: Insufficient documentation

## 2016-03-06 NOTE — Telephone Encounter (Signed)
I tried pt again [phone] under Jane LakeYevette Hebert though this # has now been disconnected. I had no other # on file. I then called pharmacy to see if they had another # for pt and I was given [phone]. I then reached pt on this #, went over lab results and recommendations. Pt aware to continue Eliquis 5 mg BID, repeat bmet when she comes in to see Jane NewcomerScott Weaver, [name] 6/12. Pt also aware she will need to follow up with [facility]. I asked her if [name] was still her PCP because when I called earlier to make appt for her [name] office said she had not been there in almost 3 yrs. Pt confirmed [name] is still her PCP. I then called [name] office and have scheduled pt to see [name] 03/21/16 @ 10 am. I tried to call pt back to let her know of appt time with [name] however there was no answer or machine to leave message on. I will keep trying to reach pt. Results were faxed to [name] office today.

## 2016-03-07 ENCOUNTER — Telehealth: Payer: Self-pay | Admitting: *Deleted

## 2016-03-07 DIAGNOSIS — R0683 Snoring: Secondary | ICD-10-CM

## 2016-03-07 NOTE — Telephone Encounter (Signed)
Pt advised of recommendation from [place]  for a sleep atudy to r/o sleep apnea. I advised Artis FlockBethany Cook will call her this week to schedule study. Pt agreeable to plan of care.

## 2016-03-07 NOTE — Telephone Encounter (Signed)
Pt has ben notified of echo results and findings by phone with verbal understanding.  Per Bing NeighborsScott W. PA  if she snores any. Pt states she does state she snores some. Advised I will d/w PA in regards to snoring for further advice. Pt also aware today of Dr. Jeannetta NapElkins appt made yesterday per Bing NeighborsScott W. PA to f/u on elevated TSH, appt 03/21/16 @ 10 am. Pt is agreeable to plan of care.

## 2016-04-02 NOTE — Progress Notes (Signed)
Cardiology Office Note:    Date:  04/03/2016   ID:  ZAYNEB BAUCUM, DOB 1931/04/29, MRN [id number]  PCP:  Kaleen Mask, MD  Cardiologist: Dr. Cassell Clement >> will establish with Dr. Tonny Bollman  Electrophysiologist: n/a  Referring MD: Kaleen Mask, *   Chief Complaint  Patient presents with  . Atrial Fibrillation    follow up    History of Present Illness:     REED EIFERT is a 82 y.o. female with a hx of HTN, aortic stenosis, HL, diabetes. She is allergic to lisinopril with suspected angioedema. Echo in 2/16 with moderate to severe aortic stenosis and mean gradient 29 mmHg.   This patient's CHA2DS2-VASc Score and unadjusted Ischemic Stroke Rate (% per year) is equal to 7.2 % stroke rate/year from a score of 5 Above score calculated as 1 point each if present [CHF, HTN, DM, Vascular=MI/PAD/Aortic Plaque, Age if 65-74, or Female] Above score calculated as 2 points each if present [Age > 75, or Stroke/TIA/TE]   Last seen here by me 03/02/16. She was noted to be in atrial fibrillation. Given her stroke risk, I placed her on Eliquis. Follow-up echocardiogram was obtained. This demonstrated normal LV function, severe diastolic dysfunction, moderate aortic stenosis and moderate aortic regurgitation, mean aortic valve gradient 27 mmHg, PASP 42 mmHg.  She returns for follow-up today with an eye towards possible DCCV.    She is here alone today. She has no complaints. Denies chest pain, shortness of breath, syncope, significant pedal edema. She denies any bleeding issues. She is tolerating Eliquis well. Today, she is back in normal sinus rhythm. She has felt no different since last seen.   Past Medical History  Diagnosis Date  . Hypertension   . Hyperlipidemia   . Exogenous obesity   . Aortic stenosis     last echo 12/2009 - AVA is .9cm - EF 55-60%  . LVH (left ventricular hypertrophy)     with diastolic dysfunction  . Heart murmur   . Dizziness       when standing too quickly - no syncope  . Family history of headaches   . History of echocardiogram     Echo 5/17: Mild concentric LVH, EF 55-60%, normal wall motion, grade 3 diastolic dysfunction, moderate aortic stenosis (mean gradient 27 mmHg), moderate AI, mild MR, severe LAE, normal RV function, mild RAE, mild to moderate TR, PASP 42 mmHg, trivial pericardial effusion    History reviewed. No pertinent past surgical history.  Current Medications: Outpatient Prescriptions Prior to Visit  Medication Sig Dispense Refill  . Acetaminophen (TYLENOL PO) Take 1 tablet by mouth as needed (pain).     Marland Kitchen amLODipine (NORVASC) 10 MG tablet TAKE 1 TABLET BY MOUTH DAILY 30 tablet 0  . apixaban (ELIQUIS) 5 MG TABS tablet Take 1 tablet (5 mg total) by mouth 2 (two) times daily. 60 tablet 11  . CRESTOR 10 MG tablet TAKE 1/2 TABLET BY MOUTH EVERY OTHER DAY 45 tablet 3  . hydrochlorothiazide (HYDRODIURIL) 25 MG tablet TAKE 1 TABLET BY MOUTH DAILY 30 tablet 11  . metFORMIN (GLUCOPHAGE) 500 MG tablet Take 1 tablet (500 mg total) by mouth daily with breakfast. 30 tablet 11  . metoprolol (LOPRESSOR) 50 MG tablet TAKE 1/2 TABLET BY MOUTH TWICE DAILY 30 tablet 11  . omeprazole (PRILOSEC) 40 MG capsule Take 1 capsule (40 mg total) by mouth daily as needed (indigestion). 30 capsule 11  . spironolactone (ALDACTONE) 25 MG tablet TAKE ONE TABLET BY  MOUTH TWICE DAILY 60 tablet 11   No facility-administered medications prior to visit.      Allergies:   Lisinopril   Social History   Social History  . Marital Status: Divorced    Spouse Name: N/A  . Number of Children: N/A  . Years of Education: N/A   Social History Main Topics  . Smoking status: Never Smoker   . Smokeless tobacco: None  . Alcohol Use: No  . Drug Use: No  . Sexual Activity: Not Asked   Other Topics Concern  . None   Social History Narrative   6 grandkids   4 great grandkids   2 great great grandkids     Family History:  The  patient's family history includes Diabetes in her sister, sister, and sister; Heart attack in her father and sister; Hypertension in her sister; Kidney failure in her sister.   ROS:   Please see the history of present illness.    ROS All other systems reviewed and are negative.   Physical Exam:    VS:  BP 122/70 mmHg  Pulse 54  Ht 5\' 4"  (1.626 m)  Wt 210 lb 12.8 oz (95.618 kg)  BMI 36.17 kg/m2   Physical Exam  Constitutional: She is oriented to person, place, and time. She appears well-developed and well-nourished.  HENT:  Head: Normocephalic.  Neck: Normal range of motion. Neck supple. No JVD present.  Cardiovascular: Normal rate and regular rhythm.   Murmur heard.  Harsh systolic murmur is present with a grade of 2/6  at the upper right sternal border Pulmonary/Chest: Effort normal and breath sounds normal. She has no wheezes. She has no rales.  Abdominal: Soft. She exhibits no mass. There is no tenderness.  Musculoskeletal: Normal range of motion. She exhibits no edema.  Neurological: She is alert and oriented to person, place, and time.  Skin: Skin is warm and dry.  Psychiatric: She has a normal mood and affect.    Wt Readings from Last 3 Encounters:  04/03/16 210 lb 12.8 oz (95.618 kg)  03/02/16 204 lb 1.9 oz (92.588 kg)  08/25/15 205 lb (92.987 kg)      Studies/Labs Reviewed:     EKG:  EKG is  ordered today.  The ekg ordered today demonstrates Sinus bradycardia, HR 54, normal axis, T-wave inversions in 1, 2, aVF, V4-V6 - similar to tracing dated 08/25/15  Recent Labs: 08/25/2015: ALT 13 03/02/2016: BUN 38*; Creat 1.51*; Hemoglobin 14.2; Platelets 313; Potassium 4.2; Sodium 135; TSH 14.56*   Recent Lipid Panel    Component Value Date/Time   CHOL 141 08/25/2015 0935   TRIG 113 08/25/2015 0935   HDL 52 08/25/2015 0935   CHOLHDL 2.7 08/25/2015 0935   VLDL 23 08/25/2015 0935   LDLCALC 66 08/25/2015 0935    Additional studies/ records that were reviewed today  include:   Echo 03/06/16 - Left ventricle: The cavity size was normal. There was mild  concentric hypertrophy. Systolic function was normal. The  estimated ejection fraction was in the range of 55% to 60%. Wall  motion was normal; there were no regional wall motion  abnormalities. Doppler parameters are consistent with a  reversible restrictive pattern, indicative of decreased left  ventricular diastolic compliance and/or increased left atrial  pressure (grade 3 diastolic dysfunction). - Aortic valve: There was moderate stenosis. There was moderate  regurgitation. Peak velocity (S): 364 cm/s. Mean gradient (S): 27  mm Hg. Regurgitation pressure half-time: 357 ms. - Mitral valve: There was  mild regurgitation. - Left atrium: The atrium was severely dilated. - Right ventricle: The cavity size was normal. Wall thickness was  normal. Systolic function was normal. - Right atrium: The atrium was mildly dilated. - Atrial septum: The septum bowed from left to right, consistent  with increased left atrial pressure. - Tricuspid valve: There was mild-moderate regurgitation. - Pulmonary arteries: Systolic pressure was mildly increased. PA  peak pressure: 42 mm Hg (S). - Pericardium, extracardiac: A trivial pericardial effusion was  identified.  Echo 12/16/14 Mild concentric LVH, EF 60-65%, normal wall motion, grade 2 diastolic dysfunction, moderate to severe aortic stenosis (mean gradient 29 mmHg, peak gradient 57 mmHg), mild AI, MAC, mild MR, mild LAE, mild to moderate TR, PASP 32 mmHg, trivial pericardial effusion  ASSESSMENT:     1. Paroxysmal atrial fibrillation (HCC)   2. Aortic stenosis   3. Benign hypertensive heart disease without heart failure   4. Snoring     PLAN:     In order of problems listed above:  1. PAF - She has back in normal sinus rhythm today. She has been asymptomatic with atrial fibrillation. Strategy of rate control will be utilized. Given her stroke  risk, she will need to remain on long-term anticoagulation. She is greater than 80. Last creatinine was 1.51. Repeat BMET, CBC will be obtained today. If her creatinine remains above 1.5, we will need to change her dose of Eliquis to 2.5 mg twice a day.  2. Aortic stenosis -  Recent echo with stable aortic valve gradients. She has moderate aortic stenosis. Continue to follow with yearly echocardiogram.    3. HTN - BP controlled.   4. Snoring - PASP was elevated on recent echocardiogram. She has a history of snoring. With her history of atrial fibrillation, she is scheduled for sleep testing to assess for sleep apnea.    Medication Adjustments/Labs and Tests Ordered: Current medicines are reviewed at length with the patient today.  Concerns regarding medicines are outlined above.  Medication changes, Labs and Tests ordered today are outlined in the Patient Instructions noted below. Patient Instructions  Medication Instructions:  No changes.  See your medication list. Labwork: Today - BMET, CBC Testing/Procedures: None today. Follow-Up: Dr. Tonny Bollman in 6 months. Any Other Special Instructions Will Be Listed Below (If Applicable). Keep your appointment for your sleep study next month.  If you need a refill on your cardiac medications before your next appointment, please call your pharmacy.    Signed, Tereso Newcomer, PA-C  04/03/2016 9:27 AM    [facility] [address] [place], [place], [place]  [address] Phone: [phone]; Fax: [phone]

## 2016-04-03 ENCOUNTER — Emergency Department (HOSPITAL_COMMUNITY): Payer: Medicare Other

## 2016-04-03 ENCOUNTER — Encounter: Payer: Self-pay | Admitting: Physician Assistant

## 2016-04-03 ENCOUNTER — Ambulatory Visit (INDEPENDENT_AMBULATORY_CARE_PROVIDER_SITE_OTHER): Payer: Medicare Other | Admitting: Physician Assistant

## 2016-04-03 ENCOUNTER — Observation Stay (HOSPITAL_COMMUNITY)
Admission: EM | Admit: 2016-04-03 | Discharge: 2016-04-05 | Disposition: A | Discharge status: Home / Self Care | Payer: Medicare Other | Attending: General Surgery | Admitting: General Surgery

## 2016-04-03 ENCOUNTER — Encounter (HOSPITAL_COMMUNITY): Payer: Self-pay

## 2016-04-03 VITALS — BP 122/70 | HR 54 | Ht 64.0 in | Wt 210.8 lb

## 2016-04-03 DIAGNOSIS — D62 Acute posthemorrhagic anemia: Secondary | ICD-10-CM | POA: Diagnosis not present

## 2016-04-03 DIAGNOSIS — R0683 Snoring: Secondary | ICD-10-CM

## 2016-04-03 DIAGNOSIS — Z79899 Other long term (current) drug therapy: Secondary | ICD-10-CM | POA: Insufficient documentation

## 2016-04-03 DIAGNOSIS — I480 Paroxysmal atrial fibrillation: Secondary | ICD-10-CM

## 2016-04-03 DIAGNOSIS — Z7984 Long term (current) use of oral hypoglycemic drugs: Secondary | ICD-10-CM | POA: Insufficient documentation

## 2016-04-03 DIAGNOSIS — Z7901 Long term (current) use of anticoagulants: Secondary | ICD-10-CM | POA: Diagnosis not present

## 2016-04-03 DIAGNOSIS — I4819 Other persistent atrial fibrillation: Secondary | ICD-10-CM | POA: Insufficient documentation

## 2016-04-03 DIAGNOSIS — S301XXA Contusion of abdominal wall, initial encounter: Secondary | ICD-10-CM

## 2016-04-03 DIAGNOSIS — I119 Hypertensive heart disease without heart failure: Secondary | ICD-10-CM | POA: Diagnosis not present

## 2016-04-03 DIAGNOSIS — S2001XA Contusion of right breast, initial encounter: Secondary | ICD-10-CM | POA: Diagnosis not present

## 2016-04-03 DIAGNOSIS — E1165 Type 2 diabetes mellitus with hyperglycemia: Secondary | ICD-10-CM | POA: Diagnosis not present

## 2016-04-03 DIAGNOSIS — E785 Hyperlipidemia, unspecified: Secondary | ICD-10-CM | POA: Insufficient documentation

## 2016-04-03 DIAGNOSIS — I350 Nonrheumatic aortic (valve) stenosis: Secondary | ICD-10-CM

## 2016-04-03 DIAGNOSIS — R55 Syncope and collapse: Secondary | ICD-10-CM | POA: Insufficient documentation

## 2016-04-03 DIAGNOSIS — S20219A Contusion of unspecified front wall of thorax, initial encounter: Secondary | ICD-10-CM | POA: Diagnosis present

## 2016-04-03 HISTORY — DX: Hypothyroidism, unspecified: E03.9

## 2016-04-03 HISTORY — DX: Type 2 diabetes mellitus without complications: E11.9

## 2016-04-03 HISTORY — DX: Contusion of abdominal wall, initial encounter: S30.1XXA

## 2016-04-03 LAB — CBC WITH DIFFERENTIAL/PLATELET
BASOS ABS: 0 10*3/uL (ref 0.0–0.1)
BASOS PCT: 1 %
BASOS PCT: 0 %
Basophils Absolute: 62 cells/uL (ref 0–200)
EOS ABS: 248 {cells}/uL (ref 15–500)
EOS PCT: 4 %
Eosinophils Absolute: 0.2 10*3/uL (ref 0.0–0.7)
Eosinophils Relative: 1 %
HCT: 36.2 % (ref 35.0–45.0)
HEMATOCRIT: 37.1 % (ref 36.0–46.0)
HEMOGLOBIN: 12 g/dL (ref 11.7–15.5)
HEMOGLOBIN: 11.6 g/dL — AB (ref 12.0–15.0)
LYMPHS ABS: 2232 {cells}/uL (ref 850–3900)
Lymphocytes Relative: 36 %
Lymphocytes Relative: 28 %
Lymphs Abs: 3.1 10*3/uL (ref 0.7–4.0)
MCH: 29.1 pg (ref 27.0–33.0)
MCH: 27.8 pg (ref 26.0–34.0)
MCHC: 33.1 g/dL (ref 32.0–36.0)
MCHC: 31.3 g/dL (ref 30.0–36.0)
MCV: 87.9 fL (ref 80.0–100.0)
MCV: 89 fL (ref 78.0–100.0)
MONOS PCT: 10 %
MPV: 10.6 fL (ref 7.5–12.5)
Monocytes Absolute: 620 cells/uL (ref 200–950)
Monocytes Absolute: 0.6 10*3/uL (ref 0.1–1.0)
Monocytes Relative: 5 %
NEUTROS ABS: 3038 {cells}/uL (ref 1500–7800)
NEUTROS ABS: 7.1 10*3/uL (ref 1.7–7.7)
NEUTROS PCT: 66 %
Neutrophils Relative %: 49 %
PLATELETS: 255 10*3/uL (ref 140–400)
Platelets: 235 10*3/uL (ref 150–400)
RBC: 4.12 MIL/uL (ref 3.80–5.10)
RBC: 4.17 MIL/uL (ref 3.87–5.11)
RDW: 14.3 % (ref 11.0–15.0)
RDW: 14 % (ref 11.5–15.5)
WBC: 6.2 10*3/uL (ref 3.8–10.8)
WBC: 10.9 10*3/uL — ABNORMAL HIGH (ref 4.0–10.5)

## 2016-04-03 LAB — COMPREHENSIVE METABOLIC PANEL
ALT: 20 U/L (ref 14–54)
AST: 33 U/L (ref 15–41)
Albumin: 3.8 g/dL (ref 3.5–5.0)
Alkaline Phosphatase: 33 U/L — ABNORMAL LOW (ref 38–126)
Anion gap: 9 (ref 5–15)
BUN: 27 mg/dL — ABNORMAL HIGH (ref 6–20)
CO2: 25 mmol/L (ref 22–32)
CREATININE: 1.46 mg/dL — AB (ref 0.44–1.00)
Calcium: 9.8 mg/dL (ref 8.9–10.3)
Chloride: 104 mmol/L (ref 101–111)
GFR calc non Af Amer: 32 mL/min — ABNORMAL LOW (ref >60)
GFR, EST AFRICAN AMERICAN: 37 mL/min — AB (ref >60)
Glucose, Bld: 182 mg/dL — ABNORMAL HIGH (ref 65–99)
Potassium: 4.3 mmol/L (ref 3.5–5.1)
SODIUM: 138 mmol/L (ref 135–145)
Total Bilirubin: 0.5 mg/dL (ref 0.3–1.2)
Total Protein: 7.5 g/dL (ref 6.5–8.1)

## 2016-04-03 LAB — I-STAT CHEM 8, ED
BUN: 30 mg/dL — ABNORMAL HIGH (ref 6–20)
Calcium, Ion: 1.16 mmol/L (ref 1.13–1.30)
Chloride: 102 mmol/L (ref 101–111)
Creatinine, Ser: 1.4 mg/dL — ABNORMAL HIGH (ref 0.44–1.00)
GLUCOSE: 181 mg/dL — AB (ref 65–99)
HEMATOCRIT: 38 % (ref 36.0–46.0)
HEMOGLOBIN: 12.9 g/dL (ref 12.0–15.0)
POTASSIUM: 4.3 mmol/L (ref 3.5–5.1)
Sodium: 141 mmol/L (ref 135–145)
TCO2: 24 mmol/L (ref 0–100)

## 2016-04-03 LAB — CBC
HEMATOCRIT: 33.7 % — AB (ref 36.0–46.0)
Hemoglobin: 10.5 g/dL — ABNORMAL LOW (ref 12.0–15.0)
MCH: 28.2 pg (ref 26.0–34.0)
MCHC: 31.2 g/dL (ref 30.0–36.0)
MCV: 90.3 fL (ref 78.0–100.0)
Platelets: 258 10*3/uL (ref 150–400)
RBC: 3.73 MIL/uL — ABNORMAL LOW (ref 3.87–5.11)
RDW: 14.2 % (ref 11.5–15.5)
WBC: 9.5 10*3/uL (ref 4.0–10.5)

## 2016-04-03 LAB — BASIC METABOLIC PANEL
BUN: 29 mg/dL — ABNORMAL HIGH (ref 7–25)
CHLORIDE: 102 mmol/L (ref 98–110)
CO2: 24 mmol/L (ref 20–31)
CREATININE: 1.53 mg/dL — AB (ref 0.60–0.88)
Calcium: 9.9 mg/dL (ref 8.6–10.4)
Glucose, Bld: 144 mg/dL — ABNORMAL HIGH (ref 65–99)
Potassium: 4.3 mmol/L (ref 3.5–5.3)
SODIUM: 138 mmol/L (ref 135–146)

## 2016-04-03 LAB — CBG MONITORING, ED
Glucose-Capillary: 165 mg/dL — ABNORMAL HIGH (ref 65–99)
Glucose-Capillary: 226 mg/dL — ABNORMAL HIGH (ref 65–99)

## 2016-04-03 LAB — I-STAT CG4 LACTIC ACID, ED
LACTIC ACID, VENOUS: 1.72 mmol/L (ref 0.5–2.0)
LACTIC ACID, VENOUS: 2.3 mmol/L — AB (ref 0.5–2.0)

## 2016-04-03 LAB — PROTIME-INR
INR: 1.61 — AB (ref 0.00–1.49)
PROTHROMBIN TIME: 19.2 s — AB (ref 11.6–15.2)

## 2016-04-03 MED ORDER — POTASSIUM CHLORIDE IN NACL 20-0.45 MEQ/L-% IV SOLN
INTRAVENOUS | Status: DC
  Administered 2016-04-03: 21:00:00 via INTRAVENOUS
  Filled 2016-04-03: qty 1000

## 2016-04-03 MED ORDER — ONDANSETRON HCL 4 MG PO TABS: 4.0000 mg | ORAL_TABLET | Freq: Four times a day (QID) | ORAL | Status: DC | PRN

## 2016-04-03 MED ORDER — PANTOPRAZOLE SODIUM 40 MG PO TBEC
80.0000 mg | DELAYED_RELEASE_TABLET | Freq: Every day | ORAL | Status: DC
  Administered 2016-04-03 – 2016-04-05 (×3): 80 mg via ORAL
  Filled 2016-04-03 (×3): qty 2

## 2016-04-03 MED ORDER — MORPHINE SULFATE (PF) 2 MG/ML IV SOLN
2.0000 mg | INTRAVENOUS | Status: DC | PRN
  Administered 2016-04-03 (×2): 2 mg via INTRAVENOUS
  Filled 2016-04-03 (×2): qty 1

## 2016-04-03 MED ORDER — SPIRONOLACTONE 25 MG PO TABS
25.0000 mg | ORAL_TABLET | Freq: Two times a day (BID) | ORAL | Status: DC
  Administered 2016-04-04 – 2016-04-05 (×3): 25 mg via ORAL
  Filled 2016-04-03 (×4): qty 1

## 2016-04-03 MED ORDER — HYDROCHLOROTHIAZIDE 25 MG PO TABS
25.0000 mg | ORAL_TABLET | Freq: Every day | ORAL | Status: DC
  Administered 2016-04-03 – 2016-04-05 (×3): 25 mg via ORAL
  Filled 2016-04-03 (×3): qty 1

## 2016-04-03 MED ORDER — AMLODIPINE BESYLATE 5 MG PO TABS: 10.0000 mg | ORAL_TABLET | Freq: Every day | ORAL | Status: DC

## 2016-04-03 MED ORDER — IOPAMIDOL (ISOVUE-300) INJECTION 61%
INTRAVENOUS | Status: AC
Start: 2016-04-03 — End: 2016-04-03
  Administered 2016-04-03: 75 mL via INTRAVENOUS
  Filled 2016-04-03: qty 75

## 2016-04-03 MED ORDER — LISINOPRIL 10 MG PO TABS: 10.0000 mg | ORAL_TABLET | Freq: Every day | ORAL | Status: DC

## 2016-04-03 MED ORDER — LEVOTHYROXINE SODIUM 50 MCG PO TABS
50.0000 ug | ORAL_TABLET | Freq: Every day | ORAL | Status: DC
  Administered 2016-04-05: 50 ug via ORAL
  Filled 2016-04-03 (×2): qty 1

## 2016-04-03 MED ORDER — IOPAMIDOL (ISOVUE-300) INJECTION 61%
INTRAVENOUS | Status: AC
  Filled 2016-04-03: qty 100

## 2016-04-03 MED ORDER — FENTANYL CITRATE (PF) 100 MCG/2ML IJ SOLN
25.0000 ug | Freq: Once | INTRAMUSCULAR | Status: AC
  Administered 2016-04-03: 25 ug via INTRAVENOUS
  Filled 2016-04-03: qty 2

## 2016-04-03 MED ORDER — INSULIN ASPART 100 UNIT/ML ~~LOC~~ SOLN
0.0000 [IU] | Freq: Three times a day (TID) | SUBCUTANEOUS | Status: DC
Start: 2016-04-04 — End: 2016-04-05
  Administered 2016-04-04 (×2): 3 [IU] via SUBCUTANEOUS
  Administered 2016-04-04: 2 [IU] via SUBCUTANEOUS
  Administered 2016-04-05: 3 [IU] via SUBCUTANEOUS

## 2016-04-03 MED ORDER — METOPROLOL TARTRATE 25 MG PO TABS
25.0000 mg | ORAL_TABLET | Freq: Two times a day (BID) | ORAL | Status: DC
  Administered 2016-04-04 – 2016-04-05 (×3): 25 mg via ORAL
  Filled 2016-04-03 (×3): qty 1

## 2016-04-03 MED ORDER — ROSUVASTATIN CALCIUM 5 MG PO TABS
5.0000 mg | ORAL_TABLET | ORAL | Status: DC
  Administered 2016-04-04: 5 mg via ORAL
  Filled 2016-04-03 (×2): qty 1

## 2016-04-03 MED ORDER — ONDANSETRON HCL 4 MG/2ML IJ SOLN: 4.0000 mg | Freq: Four times a day (QID) | INTRAMUSCULAR | Status: DC | PRN

## 2016-04-03 MED ORDER — TRAMADOL HCL 50 MG PO TABS: 50.0000 mg | ORAL_TABLET | Freq: Four times a day (QID) | ORAL | Status: DC | PRN

## 2016-04-03 MED ORDER — DOCUSATE SODIUM 100 MG PO CAPS
100.0000 mg | ORAL_CAPSULE | Freq: Two times a day (BID) | ORAL | Status: DC
Start: 2016-04-03 — End: 2016-04-05
  Administered 2016-04-04 – 2016-04-05 (×3): 100 mg via ORAL
  Filled 2016-04-03 (×3): qty 1
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
## 2016-04-03 NOTE — ED Notes (Signed)
MD at bedside. 

## 2016-04-03 NOTE — ED Notes (Signed)
Pt had a syncopal episode after blood draw. Current BP is 95/52 Pulse 59.
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
## 2016-04-03 NOTE — ED Notes (Signed)
Family at bedside. 

## 2016-04-03 NOTE — Patient Instructions (Addendum)
Medication Instructions:  No changes.  See your medication list. Labwork: Today - BMET, CBC Testing/Procedures: None today. Follow-Up: Dr. Tonny BollmanMichael Cooper in 6 months. Any Other Special Instructions Will Be Listed Below (If Applicable). Keep your appointment for your sleep study next month.  If you need a refill on your cardiac medications before your next appointment, please call your pharmacy.

## 2016-04-03 NOTE — ED Notes (Signed)
Ordered clear liquid tray for patient.

## 2016-04-03 NOTE — ED Notes (Signed)
Sprite given to Pt. Per Nehemiah SettleBrooke RN Clear tray ordered, per Avery DennisonBrooke RN

## 2016-04-03 NOTE — ED Notes (Signed)
Spoke to service response about pt's tray that never came. Ordered second tray.

## 2016-04-03 NOTE — ED Notes (Addendum)
Resident at the bedside. Performing FAST Exam

## 2016-04-04 ENCOUNTER — Encounter (HOSPITAL_COMMUNITY): Payer: Self-pay | Admitting: General Practice

## 2016-04-04 DIAGNOSIS — S20219A Contusion of unspecified front wall of thorax, initial encounter: Secondary | ICD-10-CM

## 2016-04-04 DIAGNOSIS — S301XXA Contusion of abdominal wall, initial encounter: Secondary | ICD-10-CM | POA: Diagnosis not present

## 2016-04-04 DIAGNOSIS — D62 Acute posthemorrhagic anemia: Secondary | ICD-10-CM | POA: Diagnosis not present

## 2016-04-04 HISTORY — DX: Acute posthemorrhagic anemia: D62

## 2016-04-04 HISTORY — DX: Contusion of unspecified front wall of thorax, initial encounter: S20.219A

## 2016-04-04 LAB — CBC
HEMATOCRIT: 27.6 % — AB (ref 36.0–46.0)
HEMATOCRIT: 27.9 % — AB (ref 36.0–46.0)
HEMATOCRIT: 25.6 % — AB (ref 36.0–46.0)
HEMOGLOBIN: 8.2 g/dL — AB (ref 12.0–15.0)
Hemoglobin: 8.8 g/dL — ABNORMAL LOW (ref 12.0–15.0)
Hemoglobin: 8.8 g/dL — ABNORMAL LOW (ref 12.0–15.0)
MCH: 28.3 pg (ref 26.0–34.0)
MCH: 28 pg (ref 26.0–34.0)
MCH: 28.3 pg (ref 26.0–34.0)
MCHC: 31.9 g/dL (ref 30.0–36.0)
MCHC: 31.5 g/dL (ref 30.0–36.0)
MCHC: 32 g/dL (ref 30.0–36.0)
MCV: 88.7 fL (ref 78.0–100.0)
MCV: 88.9 fL (ref 78.0–100.0)
MCV: 88.3 fL (ref 78.0–100.0)
PLATELETS: 197 10*3/uL (ref 150–400)
PLATELETS: 218 10*3/uL (ref 150–400)
Platelets: 194 10*3/uL (ref 150–400)
RBC: 3.11 MIL/uL — ABNORMAL LOW (ref 3.87–5.11)
RBC: 3.14 MIL/uL — ABNORMAL LOW (ref 3.87–5.11)
RBC: 2.9 MIL/uL — ABNORMAL LOW (ref 3.87–5.11)
RDW: 14.2 % (ref 11.5–15.5)
RDW: 14.2 % (ref 11.5–15.5)
RDW: 14.2 % (ref 11.5–15.5)
WBC: 5.9 10*3/uL (ref 4.0–10.5)
WBC: 5.4 10*3/uL (ref 4.0–10.5)
WBC: 6 10*3/uL (ref 4.0–10.5)

## 2016-04-04 LAB — BASIC METABOLIC PANEL
Anion gap: 8 (ref 5–15)
BUN: 25 mg/dL — AB (ref 6–20)
CALCIUM: 8.8 mg/dL — AB (ref 8.9–10.3)
CO2: 25 mmol/L (ref 22–32)
CREATININE: 1.49 mg/dL — AB (ref 0.44–1.00)
Chloride: 104 mmol/L (ref 101–111)
GFR calc Af Amer: 37 mL/min — ABNORMAL LOW (ref >60)
GFR, EST NON AFRICAN AMERICAN: 32 mL/min — AB (ref >60)
GLUCOSE: 147 mg/dL — AB (ref 65–99)
Potassium: 4.5 mmol/L (ref 3.5–5.1)
Sodium: 137 mmol/L (ref 135–145)

## 2016-04-04 LAB — GLUCOSE, CAPILLARY
GLUCOSE-CAPILLARY: 170 mg/dL — AB (ref 65–99)
GLUCOSE-CAPILLARY: 153 mg/dL — AB (ref 65–99)
Glucose-Capillary: 162 mg/dL — ABNORMAL HIGH (ref 65–99)

## 2016-04-04 LAB — TROPONIN I: Troponin I: <0.03 ng/mL (ref <0.031)

## 2016-04-04 LAB — CBG MONITORING, ED: Glucose-Capillary: 134 mg/dL — ABNORMAL HIGH (ref 65–99)

## 2016-04-04 MED ORDER — POLYETHYLENE GLYCOL 3350 17 G PO PACK
17.0000 g | PACK | Freq: Every day | ORAL | Status: DC
  Administered 2016-04-04 – 2016-04-05 (×2): 17 g via ORAL
  Filled 2016-04-04 (×2): qty 1

## 2016-04-04 NOTE — ED Notes (Signed)
Patient repositioned in bed breakfast tray given

## 2016-04-04 NOTE — ED Notes (Signed)
Report called to 6n
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
## 2016-04-04 NOTE — ED Notes (Signed)
Pt sleeping comfortably.
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
## 2016-04-04 NOTE — Care Management Obs Status (Signed)
MEDICARE OBSERVATION STATUS NOTIFICATION   Patient Details  Name: [name] MRN: [id number] Date of Birth: 1931-09-18   Medicare Observation Status Notification Given:  Yes    [name], Keiston Manley Marie, RN 04/04/2016, 9:53 AM

## 2016-04-04 NOTE — ED Notes (Signed)
Patient c/o back pain moved to hospital bed and repositioned for comfort. Family at bedside.

## 2016-04-05 DIAGNOSIS — S301XXA Contusion of abdominal wall, initial encounter: Secondary | ICD-10-CM | POA: Diagnosis not present

## 2016-04-05 LAB — CBC
HCT: 26.6 % — ABNORMAL LOW (ref 36.0–46.0)
Hemoglobin: 8.4 g/dL — ABNORMAL LOW (ref 12.0–15.0)
MCH: 28.3 pg (ref 26.0–34.0)
MCHC: 31.6 g/dL (ref 30.0–36.0)
MCV: 89.6 fL (ref 78.0–100.0)
Platelets: 176 10*3/uL (ref 150–400)
RBC: 2.97 MIL/uL — ABNORMAL LOW (ref 3.87–5.11)
RDW: 14.2 % (ref 11.5–15.5)
WBC: 6.5 10*3/uL (ref 4.0–10.5)

## 2016-04-05 LAB — GLUCOSE, CAPILLARY: GLUCOSE-CAPILLARY: 159 mg/dL — AB (ref 65–99)

## 2016-04-05 MED ORDER — TRAMADOL HCL 50 MG PO TABS: 50.0000 mg | ORAL_TABLET | Freq: Two times a day (BID) | ORAL | Status: DC | PRN

## 2016-04-05 MED ORDER — APIXABAN 5 MG PO TABS: 5.0000 mg | ORAL_TABLET | Freq: Two times a day (BID) | ORAL | Status: DC

## 2016-04-05 MED ORDER — TRAMADOL HCL 50 MG PO TABS: 50.0000 mg | ORAL_TABLET | Freq: Four times a day (QID) | ORAL | Status: DC | PRN

## 2016-04-05 NOTE — Progress Notes (Signed)
Discharge instructions gone  over with patient. Home medications gone over. Prescriptions given. Follow up appointments to be made.  Signs and symptoms of worsening condition gone over with patient and family. Diet and activity discussed. Patient was told to not start eloquis till Sunday, April 09, 2016. Bleeding precautions gone over with patient. Patient and family verbalized understanding of instructions.
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
## 2016-04-05 NOTE — Discharge Instructions (Signed)
Information on my medicine - ELIQUIS (apixaban)  RESTART ELIQUIS ON 04/09/2016  Why was Eliquis prescribed for you? Eliquis was prescribed for you to reduce the risk of a blood clot forming that can cause a stroke if you have a medical condition called atrial fibrillation (a type of irregular heartbeat).  What do You need to know about Eliquis ? Take your Eliquis TWICE DAILY - one tablet in the morning and one tablet in the evening with or without food. If you have difficulty swallowing the tablet whole please discuss with your pharmacist how to take the medication safely.  Take Eliquis exactly as prescribed by your doctor and DO NOT stop taking Eliquis without talking to the doctor who prescribed the medication.  Stopping may increase your risk of developing a stroke.  Refill your prescription before you run out.  After discharge, you should have regular check-up appointments with your healthcare provider that is prescribing your Eliquis.  In the future your dose may need to be changed if your kidney function or weight changes by a significant amount or as you get older.  What do you do if you miss a dose? If you miss a dose, take it as soon as you remember on the same day and resume taking twice daily.  Do not take more than one dose of ELIQUIS at the same time to make up a missed dose.  Important Safety Information A possible side effect of Eliquis is bleeding. You should call your healthcare provider right away if you experience any of the following: ? Bleeding from an injury or your nose that does not stop. ? Unusual colored urine (red or dark brown) or unusual colored stools (red or black). ? Unusual bruising for unknown reasons. ? A serious fall or if you hit your head (even if there is no bleeding).  Some medicines may interact with Eliquis and might increase your risk of bleeding or clotting while on Eliquis. To help avoid this, consult your healthcare provider or  pharmacist prior to using any new prescription or non-prescription medications, including herbals, vitamins, non-steroidal anti-inflammatory drugs (NSAIDs) and supplements.  This website has more information on Eliquis (apixaban): http://[email or web address].com/eliquis/home
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
## 2016-04-27 ENCOUNTER — Other Ambulatory Visit: Payer: Self-pay | Admitting: *Deleted

## 2016-04-27 MED ORDER — AMLODIPINE BESYLATE 10 MG PO TABS: 10.0000 mg | ORAL_TABLET | Freq: Every day | ORAL | Status: DC

## 2016-05-05 ENCOUNTER — Encounter (HOSPITAL_BASED_OUTPATIENT_CLINIC_OR_DEPARTMENT_OTHER): Payer: Medicare Other

## 2016-05-26 ENCOUNTER — Other Ambulatory Visit: Payer: Self-pay | Admitting: Family Medicine

## 2016-05-26 DIAGNOSIS — E2839 Other primary ovarian failure: Secondary | ICD-10-CM

## 2016-06-02 ENCOUNTER — Ambulatory Visit
Admission: RE | Admit: 2016-06-02 | Discharge: 2016-06-02 | Disposition: A | Discharge status: Home / Self Care | Payer: Medicare Other | Source: Ambulatory Visit | Attending: Family Medicine | Admitting: Family Medicine

## 2016-06-02 DIAGNOSIS — E2839 Other primary ovarian failure: Secondary | ICD-10-CM

## 2016-07-07 ENCOUNTER — Telehealth: Payer: Self-pay | Admitting: *Deleted

## 2016-07-07 ENCOUNTER — Other Ambulatory Visit: Payer: Medicare Other | Admitting: *Deleted

## 2016-07-07 DIAGNOSIS — I119 Hypertensive heart disease without heart failure: Secondary | ICD-10-CM

## 2016-07-07 DIAGNOSIS — E785 Hyperlipidemia, unspecified: Secondary | ICD-10-CM

## 2016-07-07 DIAGNOSIS — I517 Cardiomegaly: Secondary | ICD-10-CM

## 2016-07-07 LAB — BASIC METABOLIC PANEL
BUN: 26 mg/dL — AB (ref 7–25)
CHLORIDE: 98 mmol/L (ref 98–110)
CO2: 23 mmol/L (ref 20–31)
CREATININE: 1.64 mg/dL — AB (ref 0.60–0.88)
Calcium: 10.6 mg/dL — ABNORMAL HIGH (ref 8.6–10.4)
Glucose, Bld: 157 mg/dL — ABNORMAL HIGH (ref 65–99)
POTASSIUM: 4.3 mmol/L (ref 3.5–5.3)
Sodium: 134 mmol/L — ABNORMAL LOW (ref 135–146)

## 2016-07-07 MED ORDER — SPIRONOLACTONE 25 MG PO TABS: 12.5000 mg | ORAL_TABLET | Freq: Every day | ORAL | Status: DC

## 2016-07-07 MED ORDER — HYDROCHLOROTHIAZIDE 25 MG PO TABS: 12.5000 mg | ORAL_TABLET | Freq: Every day | ORAL | Status: DC

## 2016-07-07 NOTE — Addendum Note (Signed)
Addended by: Tonita PhoenixBOWDEN, Corby Vandenberghe K on: 07/07/2016 08:47 AM   Modules accepted: Orders

## 2016-07-07 NOTE — Telephone Encounter (Signed)
Pt notified of lab results and findings by phone with verbal understanding to plan of care. Decrease HCTZ to 12.5 mg daily, decrease Spironolactone to 12.5 mg daily; BMET 07/17/16. Pt will check bp daily x 1 week and call with readings.

## 2016-07-17 ENCOUNTER — Other Ambulatory Visit: Payer: Medicare Other | Admitting: *Deleted

## 2016-07-17 ENCOUNTER — Telehealth: Payer: Self-pay | Admitting: *Deleted

## 2016-07-17 DIAGNOSIS — I119 Hypertensive heart disease without heart failure: Secondary | ICD-10-CM

## 2016-07-17 LAB — BASIC METABOLIC PANEL WITH GFR
BUN: 26 mg/dL — ABNORMAL HIGH (ref 7–25)
CO2: 25 mmol/L (ref 20–31)
Calcium: 10.1 mg/dL (ref 8.6–10.4)
Chloride: 100 mmol/L (ref 98–110)
Creat: 1.48 mg/dL — ABNORMAL HIGH (ref 0.60–0.88)
Glucose, Bld: 151 mg/dL — ABNORMAL HIGH (ref 65–99)
Potassium: 4.6 mmol/L (ref 3.5–5.3)
Sodium: 138 mmol/L (ref 135–146)

## 2016-07-17 NOTE — Telephone Encounter (Signed)
Pt notified of lab results by phone with verbal understanding to continue on the Eliquis 5 mg BID. Pt stated to me that she had the BP readings that I asked her for on 9/15.   9/18 : 131/84 9/19: 134/41 9/20: 119/79 9/21: 114/68 9/22: 134/80  I asked pt was she doing anything in particular when the higher readings took place. Pt said no she thinks it may have been her diet those days. I advised pt that I will d/w Bing NeighborsScott W. PA as to further recommendations from the lab results and recommendations from 07/07/16 where the PA decrease HCTZ to 12.5 mg daily and decreased Spironolactone to 12.5 mg daily due to Creatinine and BUN elevated . Advised pt I will call her back sometime tomorrow 07/18/16 with any further recommendations. Pt said thank you.

## 2016-07-18 ENCOUNTER — Telehealth: Payer: Self-pay | Admitting: *Deleted

## 2016-07-18 NOTE — Telephone Encounter (Signed)
S/w pt this morning in regards to her BP readings that she gave me last night. I advised pt per Tereso NewcomerScott Weaver, PA BP is controlled and to continue on current Tx plan. Pt is agreeable to this. Pt also confirmed her Metoprolol tart dose is metoprolol tart 50 mg tablet with the directions to take 1/2 tablet twice daily. I confirmed with the pt this is correct. Pt said thank you.

## 2016-08-01 ENCOUNTER — Other Ambulatory Visit: Payer: Self-pay | Admitting: Family Medicine

## 2016-08-01 DIAGNOSIS — R928 Other abnormal and inconclusive findings on diagnostic imaging of breast: Secondary | ICD-10-CM

## 2016-08-07 ENCOUNTER — Ambulatory Visit
Admission: RE | Admit: 2016-08-07 | Discharge: 2016-08-07 | Disposition: A | Discharge status: Home / Self Care | Payer: Medicare Other | Source: Ambulatory Visit | Attending: Family Medicine | Admitting: Family Medicine

## 2016-08-07 DIAGNOSIS — R928 Other abnormal and inconclusive findings on diagnostic imaging of breast: Secondary | ICD-10-CM

## 2016-09-01 ENCOUNTER — Other Ambulatory Visit: Payer: Self-pay | Admitting: *Deleted

## 2016-09-01 DIAGNOSIS — I119 Hypertensive heart disease without heart failure: Secondary | ICD-10-CM

## 2016-09-01 MED ORDER — METOPROLOL TARTRATE 50 MG PO TABS: 25.0000 mg | ORAL_TABLET | Freq: Two times a day (BID) | ORAL | 6 refills | Status: DC

## 2016-09-01 MED ORDER — HYDROCHLOROTHIAZIDE 25 MG PO TABS: 12.5000 mg | ORAL_TABLET | Freq: Every day | ORAL | 5 refills | Status: DC

## 2016-09-01 MED ORDER — SPIRONOLACTONE 25 MG PO TABS: 12.5000 mg | ORAL_TABLET | Freq: Every day | ORAL | 5 refills | Status: DC

## 2016-10-05 ENCOUNTER — Other Ambulatory Visit: Payer: Self-pay | Admitting: Family Medicine

## 2016-10-05 DIAGNOSIS — T148XXA Other injury of unspecified body region, initial encounter: Secondary | ICD-10-CM

## 2016-10-09 ENCOUNTER — Ambulatory Visit (INDEPENDENT_AMBULATORY_CARE_PROVIDER_SITE_OTHER): Payer: Medicare Other | Admitting: Cardiovascular Disease

## 2016-10-09 ENCOUNTER — Encounter: Payer: Self-pay | Admitting: Cardiovascular Disease

## 2016-10-09 VITALS — BP 108/70 | HR 80 | Ht 64.0 in | Wt 209.4 lb

## 2016-10-09 DIAGNOSIS — I350 Nonrheumatic aortic (valve) stenosis: Secondary | ICD-10-CM

## 2016-10-09 DIAGNOSIS — I480 Paroxysmal atrial fibrillation: Secondary | ICD-10-CM

## 2016-10-09 NOTE — Progress Notes (Signed)
Cardiology Office Note Date:  10/09/2016   ID:  BAILEI BUIST, DOB 1931/06/07, MRN [id number]  PCP:  Dois Davenport., MD  Cardiologist:  Tonny Bollman, MD    Chief Complaint  Patient presents with  . Aortic Stenosis     History of Present Illness: [name] is a 82 y.o. female who presents for follow-up evaluation. She is a former patient of Dr Patty Sermons and this is my first encounter with her. The patient was last seen by Tereso Newcomer in June 2017. She's been followed for atrial fibrillation, hypertension, aortic stenosis, hyperlipidemia, and diabetes. The patient is anticoagulated with Eliquis and has not had a history of bleeding problems. She is here alone today. She is functionally independent, still drives a car, and gets out to run errands. She is not engaged in any regular exercise. She denies chest pain, chest pressure, or shortness of breath. She has chronic leg swelling. Otherwise doing well and denies orthopnea, PND, heart palpitations, or lightheadedness.   Past Medical History:  Diagnosis Date  . Aortic stenosis    last echo 12/2009 - AVA is .9cm - EF 55-60%  . Dizziness    when standing too quickly - no syncope  . Exogenous obesity   . Family history of headaches   . Heart murmur   . History of echocardiogram    Echo 5/17: Mild concentric LVH, EF 55-60%, normal wall motion, grade 3 diastolic dysfunction, moderate aortic stenosis (mean gradient 27 mmHg), moderate AI, mild MR, severe LAE, normal RV function, mild RAE, mild to moderate TR, PASP 42 mmHg, trivial pericardial effusion  . Hyperlipidemia   . Hypertension   . Hypothyroidism   . LVH (left ventricular hypertrophy)    with diastolic dysfunction  . Type II diabetes mellitus (HCC)     Past Surgical History:  Procedure Laterality Date  . LAPAROSCOPIC CHOLECYSTECTOMY  2008    Current Outpatient Prescriptions  Medication Sig Dispense Refill  . acetaminophen (TYLENOL) 500 MG tablet Take  1,000 mg by mouth every 6 (six) hours as needed for mild pain, moderate pain or headache.    Marland Kitchen amLODipine (NORVASC) 10 MG tablet Take 1 tablet (10 mg total) by mouth daily. 90 tablet 2  . apixaban (ELIQUIS) 5 MG TABS tablet Take 1 tablet (5 mg total) by mouth 2 (two) times daily. 60 tablet 11  . b complex-vitamin c-folic acid (NEPHRO-VITE) 0.8 MG TABS tablet Take 1 tablet by mouth at bedtime.    . hydrochlorothiazide (HYDRODIURIL) 25 MG tablet Take 0.5 tablets (12.5 mg total) by mouth daily. 15 tablet 5  . levothyroxine (SYNTHROID, LEVOTHROID) 75 MCG tablet Take 75 mcg by mouth daily.    Marland Kitchen losartan (COZAAR) 50 MG tablet Take 50 mg by mouth daily.    . metFORMIN (GLUCOPHAGE) 500 MG tablet Take 1 tablet (500 mg total) by mouth daily with breakfast. 30 tablet 11  . metoprolol (LOPRESSOR) 50 MG tablet Take 0.5 tablets (25 mg total) by mouth 2 (two) times daily. 30 tablet 6  . omeprazole (PRILOSEC) 40 MG capsule Take 1 capsule (40 mg total) by mouth daily as needed (indigestion). 30 capsule 11  . rosuvastatin (CRESTOR) 10 MG tablet Take 5 mg by mouth daily.    Marland Kitchen spironolactone (ALDACTONE) 25 MG tablet Take 0.5 tablets (12.5 mg total) by mouth daily. 15 tablet 5  . traMADol (ULTRAM) 50 MG tablet Take 1-2 tablets (50-100 mg total) by mouth every 12 (twelve) hours as needed (50mg  for mild pain, 75mg   for moderate pain, 100mg  for severe pain). 30 tablet 0   No current facility-administered medications for this visit.     Allergies:   Lisinopril   Social History:  The patient  reports that she has never smoked. She has never used smokeless tobacco. She reports that she does not drink alcohol or use drugs.   Family History:  The patient's  family history includes Diabetes in her sister, sister, and sister; Heart attack in her father and sister; Hypertension in her sister; Kidney failure in her sister.    ROS:  Please see the history of present illness.  All other systems are reviewed and negative.     PHYSICAL EXAM: VS:  BP 108/70   Pulse 80   Ht 5\' 4"  (1.626 m)   Wt 209 lb 6.4 oz (95 kg)   BMI 35.94 kg/m  , BMI Body mass index is 35.94 kg/m. GEN: Well nourished, well developed, in no acute distress  HEENT: normal  Neck: no JVD, no masses. No carotid bruits Cardiac: irregularly irregular with 3/6 harsh systolic murmur at the RUSB, no diastolic murmur, second heart sound is audible.               Respiratory:  clear to auscultation bilaterally, normal work of breathing GI: soft, nontender, nondistended, + BS MS: no deformity or atrophy  Ext: no pretibial edema, pedal pulses 2+= bilaterally Skin: warm and dry, no rash Neuro:  Strength and sensation are intact Psych: euthymic mood, full affect  EKG:  EKG is ordered today. The ekg ordered today shows atrial flutter with variable AV block 80 bpm, nonspecific T wave abnormality.  Recent Labs: 03/02/2016: TSH 14.56 04/03/2016: ALT 20 04/05/2016: Hemoglobin 8.4; Platelets 176 07/17/2016: BUN 26; Creat 1.48; Potassium 4.6; Sodium 138   Lipid Panel     Component Value Date/Time   CHOL 141 08/25/2015 0935   TRIG 113 08/25/2015 0935   HDL 52 08/25/2015 0935   CHOLHDL 2.7 08/25/2015 0935   VLDL 23 08/25/2015 0935   LDLCALC 66 08/25/2015 0935      Wt Readings from Last 3 Encounters:  10/09/16 209 lb 6.4 oz (95 kg)  04/03/16 210 lb (95.3 kg)  04/03/16 210 lb 12.8 oz (95.6 kg)     Cardiac Studies Reviewed: 2D Echo 03-06-2016: Study Conclusions  - Left ventricle: The cavity size was normal. There was mild   concentric hypertrophy. Systolic function was normal. The   estimated ejection fraction was in the range of 55% to 60%. Wall   motion was normal; there were no regional wall motion   abnormalities. Doppler parameters are consistent with a   reversible restrictive pattern, indicative of decreased left   ventricular diastolic compliance and/or increased left atrial   pressure (grade 3 diastolic dysfunction). - Aortic  valve: There was moderate stenosis. There was moderate   regurgitation. Peak velocity (S): 364 cm/s. Mean gradient (S): 27   mm Hg. Regurgitation pressure half-time: 357 ms. - Mitral valve: There was mild regurgitation. - Left atrium: The atrium was severely dilated. - Right ventricle: The cavity size was normal. Wall thickness was   normal. Systolic function was normal. - Right atrium: The atrium was mildly dilated. - Atrial septum: The septum bowed from left to right, consistent   with increased left atrial pressure. - Tricuspid valve: There was mild-moderate regurgitation. - Pulmonary arteries: Systolic pressure was mildly increased. PA   peak pressure: 42 mm Hg (S). - Pericardium, extracardiac: A trivial pericardial effusion was  identified.  ASSESSMENT AND PLAN: 1.  Atrial fibrillation/flutter, paroxysmal: The patient continues on Eliquis for anticoagulation. She is asymptomatic. Her heart rate is controlled.   This patients CHA2DS2-VASc Score and unadjusted Ischemic Stroke Rate (% per year) is equal to 7.2 % stroke rate/year from a score of 5  Above score calculated as 1 point each if present [CHF, HTN, DM, Vascular=MI/PAD/Aortic Plaque, Age if 65-74, or Female] Above score calculated as 2 points each if present [Age > 75, or Stroke/TIA/TE]  2. Moderate aortic stenosis: Most recent echocardiogram is reviewed. Will repeat in 6 months. Her exam is consistent with moderate aortic stenosis. She appears to be asymptomatic.  3. Hypertension with chronic kidney disease: Blood pressure is well controlled on a combination of amlodipine, metoprolol, and spironolactone. The patient is followed by nephrology. Nephrology notes are reviewed today.   Current medicines are reviewed with the patient today.  The patient does not have concerns regarding medicines.  Labs/ tests ordered today include:   Orders Placed This Encounter  Procedures  . EKG 12-Lead  . ECHOCARDIOGRAM COMPLETE     Disposition:   FU 6 months with Tereso NewcomerScott Weaver, PA-C and one year with me.   Enzo BiSigned, Antowan Samford, MD  10/09/2016 10:21 AM    [facility] [address], [place], [place]  [address] Phone: [phone]; Fax: [phone]

## 2016-10-09 NOTE — Patient Instructions (Signed)
Medication Instructions:  Your physician recommends that you continue on your current medications as directed. Please refer to the Current Medication list given to you today.  Labwork: No new orders.  Testing/Procedures: Your physician has requested that you have an echocardiogram in 6 MONTHS. Echocardiography is a painless test that uses sound waves to create images of your heart. It provides your doctor with information about the size and shape of your heart and how well your heart's chambers and valves are working. This procedure takes approximately one hour. There are no restrictions for this procedure.  Follow-Up: Your physician recommends that you schedule a follow-up appointment in: 6 MONTHS with Tereso NewcomerScott Weaver PA-C after echocardiogram  Your physician wants you to follow-up in: 1 YEAR with Dr Excell Seltzerooper.  You will receive a reminder letter in the mail two months in advance. If you don't receive a letter, please call our office to schedule the follow-up appointment.   Any Other Special Instructions Will Be Listed Below (If Applicable).     If you need a refill on your cardiac medications before your next appointment, please call your pharmacy.

## 2016-10-17 ENCOUNTER — Other Ambulatory Visit: Payer: Self-pay

## 2016-10-17 MED ORDER — ROSUVASTATIN CALCIUM 10 MG PO TABS: 5.0000 mg | ORAL_TABLET | Freq: Every day | ORAL | 1 refills | Status: DC

## 2016-11-02 ENCOUNTER — Ambulatory Visit
Admission: RE | Admit: 2016-11-02 | Discharge: 2016-11-02 | Disposition: A | Discharge status: Home / Self Care | Payer: Medicare Other | Source: Ambulatory Visit | Attending: Family Medicine | Admitting: Family Medicine

## 2016-11-02 DIAGNOSIS — T148XXA Other injury of unspecified body region, initial encounter: Secondary | ICD-10-CM

## 2017-02-26 ENCOUNTER — Other Ambulatory Visit: Payer: Self-pay | Admitting: Physician Assistant

## 2017-02-26 DIAGNOSIS — K219 Gastro-esophageal reflux disease without esophagitis: Secondary | ICD-10-CM

## 2017-02-26 DIAGNOSIS — I119 Hypertensive heart disease without heart failure: Secondary | ICD-10-CM

## 2017-03-30 ENCOUNTER — Other Ambulatory Visit: Payer: Self-pay | Admitting: Physician Assistant

## 2017-04-09 ENCOUNTER — Other Ambulatory Visit: Payer: Self-pay

## 2017-04-09 ENCOUNTER — Ambulatory Visit (HOSPITAL_COMMUNITY): Payer: Medicare Other | Attending: Cardiology

## 2017-04-09 DIAGNOSIS — I350 Nonrheumatic aortic (valve) stenosis: Secondary | ICD-10-CM

## 2017-04-09 DIAGNOSIS — I272 Pulmonary hypertension, unspecified: Secondary | ICD-10-CM | POA: Insufficient documentation

## 2017-04-09 DIAGNOSIS — I083 Combined rheumatic disorders of mitral, aortic and tricuspid valves: Secondary | ICD-10-CM | POA: Insufficient documentation

## 2017-04-09 DIAGNOSIS — I313 Pericardial effusion (noninflammatory): Secondary | ICD-10-CM | POA: Diagnosis not present

## 2017-04-13 ENCOUNTER — Encounter: Payer: Self-pay | Admitting: Physician Assistant

## 2017-04-13 ENCOUNTER — Ambulatory Visit (INDEPENDENT_AMBULATORY_CARE_PROVIDER_SITE_OTHER): Payer: Medicare Other | Admitting: Physician Assistant

## 2017-04-13 VITALS — BP 92/50 | HR 71 | Ht 64.0 in | Wt 207.0 lb

## 2017-04-13 DIAGNOSIS — I481 Persistent atrial fibrillation: Secondary | ICD-10-CM

## 2017-04-13 DIAGNOSIS — E119 Type 2 diabetes mellitus without complications: Secondary | ICD-10-CM

## 2017-04-13 DIAGNOSIS — I4819 Other persistent atrial fibrillation: Secondary | ICD-10-CM

## 2017-04-13 DIAGNOSIS — I350 Nonrheumatic aortic (valve) stenosis: Secondary | ICD-10-CM

## 2017-04-13 DIAGNOSIS — I119 Hypertensive heart disease without heart failure: Secondary | ICD-10-CM | POA: Diagnosis not present

## 2017-04-13 LAB — BASIC METABOLIC PANEL
BUN/Creatinine Ratio: 19 (ref 12–28)
BUN: 24 mg/dL (ref 8–27)
CALCIUM: 10.5 mg/dL — AB (ref 8.7–10.3)
CO2: 21 mmol/L (ref 20–29)
CREATININE: 1.24 mg/dL — AB (ref 0.57–1.00)
Chloride: 103 mmol/L (ref 96–106)
GFR calc Af Amer: 46 mL/min/{1.73_m2} — ABNORMAL LOW (ref >59)
GFR, EST NON AFRICAN AMERICAN: 40 mL/min/{1.73_m2} — AB (ref >59)
GLUCOSE: 117 mg/dL — AB (ref 65–99)
Potassium: 4.3 mmol/L (ref 3.5–5.2)
SODIUM: 142 mmol/L (ref 134–144)

## 2017-04-13 MED ORDER — HYDROCHLOROTHIAZIDE 25 MG PO TABS: 25.0000 mg | ORAL_TABLET | Freq: Every day | ORAL | 3 refills | Status: DC | PRN

## 2017-04-13 NOTE — Patient Instructions (Addendum)
Medication Instructions:  1. CHANGE HCTZ TO TAKE 25 MG DAILY ONLY AS NEEDED FOR EXCESS SWELLING  Labwork: TODAY BMET, CBC  Testing/Procedures: 1. Your physician has requested that you have an LIMITED echocardiogram. Echocardiography is a painless test that uses sound waves to create images of your heart. It provides your doctor with information about the size and shape of your heart and how well your heart's chambers and valves are working. This procedure takes approximately one hour. There are no restrictions for this procedure. TO BE DONE IN 6 MONTHS    Follow-Up: Your physician wants you to follow-up in: 6 MONTHS WITH DR. Theodoro ParmaOOPER You will receive a reminder letter in the mail two months in advance. If you don't receive a letter, please call our office to schedule the follow-up appointment.   Any Other Special Instructions Will Be Listed Below (If Applicable).     If you need a refill on your cardiac medications before your next appointment, please call your pharmacy.

## 2017-04-13 NOTE — Progress Notes (Signed)
Cardiology Office Note:    Date:  04/13/2017   ID:  [name], DOB 20-Mar-1931, MRN [id number]  PCP:  Hayden Rasmussen, MD  Cardiologist:  Dr. Darlin Coco >> Dr. Sherren Mocha   Nephrology:  Dr. Moshe Cipro  Referring MD: Hayden Rasmussen, MD   Chief Complaint  Patient presents with  . Aortic Stenosis    follow up    History of Present Illness:    [name] is a 83 y.o. female with a hx of PAF, HTN, aortic stenosis, HL, diabetes. She is allergic to lisinopril with suspected angioedema. CHADS2-VASc=5 (HTN, DM, female, 83 yo).  She was placed on Apixaban last year when she was noted to be in atrial fibrillation.  She saw Dr. Sherren Mocha in 09/2016.  Recent echocardiogram this week demonstrates worsening AV gradients (approaching severe aortic stenosis).     [name] returns for follow up.  She is here alone.  She denies chest pain, fatigue, shortness of breath, syncope, orthopnea, PND or significant pedal edema.   Prior CV studies:   The following studies were reviewed today:  Echo 04/09/17 Mild concentric LVH, EF 60-65, normal wall motion, moderate to severe aortic stenosis (mean 32, peak 55; LVOT/AV velocity - 0.17), mild AI, MAC, mild MR, severe LAE, moderate TR, PASP 50, trivial pericardial effusion  Echo 03/06/16 Mild conc LVH, EF 55-60, no RWMA, Gr 3 DD, mod AS (mena 27), mod AI, mild MR, severe LAE, mild RAE, mild to mod TR, PASP 42  Echo 12/16/14 Mild concentric LVH, EF 60-65%, normal wall motion, grade 2 diastolic dysfunction, moderate to severe aortic stenosis (mean gradient 29 mmHg, peak gradient 57 mmHg), mild AI, MAC, mild MR, mild LAE, mild to moderate TR, PASP 32 mmHg, trivial pericardial effusion  Past Medical History:  Diagnosis Date  . Aortic stenosis    last echo 12/2009 - AVA is .9cm - EF 55-60%  . Dizziness    when standing too quickly - no syncope  . Exogenous obesity   . Family history of headaches   . Heart murmur   .  History of echocardiogram    Echo 5/17: Mild concentric LVH, EF 55-60%, normal wall motion, grade 3 diastolic dysfunction, moderate aortic stenosis (mean gradient 27 mmHg), moderate AI, mild MR, severe LAE, normal RV function, mild RAE, mild to moderate TR, PASP 42 mmHg, trivial pericardial effusion  . Hyperlipidemia   . Hypertension   . Hypothyroidism   . LVH (left ventricular hypertrophy)    with diastolic dysfunction  . Type II diabetes mellitus ([place])     Past Surgical History:  Procedure Laterality Date  . LAPAROSCOPIC CHOLECYSTECTOMY  2008    Current Medications: Current Meds  Medication Sig  . acetaminophen (TYLENOL) 500 MG tablet Take 1,000 mg by mouth every 6 (six) hours as needed for mild pain, moderate pain or headache.  Marland Kitchen amLODipine (NORVASC) 10 MG tablet Take 1 tablet (10 mg total) by mouth daily.  Marland Kitchen apixaban (ELIQUIS) 5 MG TABS tablet Take 1 tablet (5 mg total) by mouth 2 (two) times daily.  Marland Kitchen b complex-vitamin c-folic acid (NEPHRO-VITE) 0.8 MG TABS tablet Take 1 tablet by mouth at bedtime.  Marland Kitchen levothyroxine (SYNTHROID, LEVOTHROID) 75 MCG tablet Take 75 mcg by mouth daily.  Marland Kitchen losartan (COZAAR) 50 MG tablet Take 50 mg by mouth daily.  . metFORMIN (GLUCOPHAGE) 500 MG tablet Take 500 mg by mouth 2 (two) times daily with a meal.  . metoprolol tartrate (LOPRESSOR) 50 MG  tablet TAKE 1/2 TABLET BY MOUTH TWICE DAILY  . omeprazole (PRILOSEC) 40 MG capsule Take 40 mg by mouth daily as needed (HEARTBURN).  . rosuvastatin (CRESTOR) 10 MG tablet Take 10 mg by mouth every other day.  . spironolactone (ALDACTONE) 25 MG tablet TAKE 1/2 TABLET BY MOUTH DAILY  . traMADol (ULTRAM) 50 MG tablet Take 1-2 tablets (50-100 mg total) by mouth every 12 (twelve) hours as needed (90m for mild pain, 735mfor moderate pain, 10085mor severe pain).  . [DISCONTINUED] hydrochlorothiazide (HYDRODIURIL) 25 MG tablet TAKE 1/2 TABLET BY MOUTH DAILY     Allergies:   Lisinopril   Social History   Social  History  . Marital status: Widowed    Spouse name: N/A  . Number of children: N/A  . Years of education: N/A   Social History Main Topics  . Smoking status: Never Smoker  . Smokeless tobacco: Never Used  . Alcohol use No  . Drug use: No  . Sexual activity: Not Asked   Other Topics Concern  . None   Social History Narrative   6 grandkids   4 great grandkids   2 great great grandkids     Family Hx: The patient's family history includes Diabetes in her sister, sister, and sister; Heart attack in her father and sister; Hypertension in her sister; Kidney failure in her sister.  ROS:   Please see the history of present illness.    ROS All other systems reviewed and are negative.   EKGs/Labs/Other Test Reviewed:    EKG:  EKG is  ordered today.  The ekg ordered today demonstrates AFlutter, HR 71, normal axis, similar to prior tracing  Recent Labs: 07/17/2016: BUN 26; Creat 1.48; Potassium 4.6; Sodium 138  Labs from Nephrology 2/18: Creatinine 1.26; Hgb 13.2  Recent Lipid Panel Lab Results  Component Value Date/Time   CHOL 141 08/25/2015 09:35 AM   TRIG 113 08/25/2015 09:35 AM   HDL 52 08/25/2015 09:35 AM   CHOLHDL 2.7 08/25/2015 09:35 AM   LDLCALC 66 08/25/2015 09:35 AM    Physical Exam:    VS:  BP (!) 92/50   Pulse 71   Ht _0  (1.626 m)   Wt 207 lb (93.9 kg)   BMI 35.53 kg/m     Wt Readings from Last 3 Encounters:  04/13/17 207 lb (93.9 kg)  10/09/16 209 lb 6.4 oz (95 kg)  04/03/16 210 lb (95.3 kg)     Physical Exam  Constitutional: She is oriented to person, place, and time. She appears well-developed and well-nourished. No distress.  HENT:  Head: Normocephalic and atraumatic.  Eyes: No scleral icterus.  Neck: Normal range of motion. No JVD present.  Cardiovascular: Normal rate, regular rhythm, S1 normal and S2 normal.   Murmur heard.  Harsh systolic murmur is present with a grade of 2/6  at the lower left sternal border Pulmonary/Chest: Breath  sounds normal. She has no wheezes. She has no rhonchi. She has no rales.  Abdominal: Soft. There is no tenderness.  Musculoskeletal: She exhibits edema (trace bilat ankle edema).  Neurological: She is alert and oriented to person, place, and time.  Skin: Skin is warm and dry.  Psychiatric: She has a normal mood and affect.    ASSESSMENT:    1. Nonrheumatic aortic valve stenosis   2. Persistent atrial fibrillation ([place] 3. Benign hypertensive heart disease without heart failure   4. Type 2 diabetes mellitus without complication, unspecified whether long term insulin  use ([place])    PLAN:    In order of problems listed above:  1. Nonrheumatic aortic valve stenosis -  Recent echocardiogram demonstrates moderate to severe aortic stenosis with mean gradient < 40 mmHg.  But, her dimensionless index is < 0.25.  She is not currently symptomatic and is continuing to do well without chest pain, shortness of breath, syncope.  Therefore, we will continue to monitor her symptomatically.  I did discuss with her the warning symptoms that would prompt earlier follow up.    -  Arrange follow up echocardiogram in 6 mos to re-assess aortic stenosis   -  FU with Dr. Sherren Mocha after echocardiogram   2. Persistent atrial fibrillation ([place]) -  She appears to be in AFlutter today. Rate is controlled. She is not symptomatic. Continue current dose of beta-blocker.  Continue Apixaban 5 mg bid.  Labs today: BMET, CBC.  If her SCr is > 1.5, will need to reduced Apixaban to 2.5 mg bid.   3. Benign hypertensive heart disease without heart failure - BP running low. With her progressing aortic stenosis, I would like to avoid hypotension and dehydration.    -  Change HCTZ to as needed only  4. Type 2 diabetes mellitus without complication, unspecified whether long term insulin use ([facility]) - FU with PCP.   Dispo:  Return in about 6 months (around 10/13/2017) for Routine Follow Up, w/ Dr. Burt Knack, or Richardson Dopp,  PA-C.   Medication Adjustments/Labs and Tests Ordered: Current medicines are reviewed at length with the patient today.  Concerns regarding medicines are outlined above.  Orders/Tests:  Orders Placed This Encounter  Procedures  . Basic Metabolic Panel (BMET)  . CBC  . EKG 12-Lead  . ECHOCARDIOGRAM LIMITED   Medication changes: Meds ordered this encounter  Medications  . hydrochlorothiazide (HYDRODIURIL) 25 MG tablet    Sig: Take 1 tablet (25 mg total) by mouth daily as needed.    Dispense:  30 tablet    Refill:  3   Signed, Richardson Dopp, PA-C  04/13/2017 [facility] [place], [place], [place]  [address] Phone: [phone]; Fax: [phone]

## 2017-04-14 LAB — CBC
Hematocrit: 38.3 % (ref 34.0–46.6)
Hemoglobin: 12.9 g/dL (ref 11.1–15.9)
MCH: 29.8 pg (ref 26.6–33.0)
MCHC: 33.7 g/dL (ref 31.5–35.7)
MCV: 89 fL (ref 79–97)
PLATELETS: 265 10*3/uL (ref 150–379)
RBC: 4.33 x10E6/uL (ref 3.77–5.28)
RDW: 14.1 % (ref 12.3–15.4)
WBC: 6.7 10*3/uL (ref 3.4–10.8)

## 2017-04-16 ENCOUNTER — Telehealth: Payer: Self-pay | Admitting: *Deleted

## 2017-04-16 NOTE — Telephone Encounter (Signed)
Tried to reach pt to go over lab results, no answer. Will try again later.

## 2017-04-16 NOTE — Telephone Encounter (Signed)
-----   Message from Beatrice LecherScott T Weaver, [place] sent at 04/13/2017  5:18 PM EDT ----- Please call the patient Kidney function is stable. CBC is pending. Continue with current treatment plan. Tereso NewcomerScott Weaver, PA-C   04/13/2017 5:18 PM

## 2017-04-16 NOTE — Telephone Encounter (Signed)
Pt notified of lab results by phone with verbal understanding. Pt thanked me for my call.   

## 2017-04-16 NOTE — Telephone Encounter (Signed)
-----   Message from Beatrice LecherScott T Weaver, [place] sent at 04/14/2017  6:04 AM EDT ----- Please call the patient The hemoglobin is normal. Continue with current treatment plan. Tereso NewcomerScott Weaver, PA-C   04/14/2017 6:04 AM

## 2017-04-24 ENCOUNTER — Other Ambulatory Visit: Payer: Self-pay | Admitting: Family Medicine

## 2017-04-24 ENCOUNTER — Other Ambulatory Visit: Payer: Self-pay | Admitting: Physician Assistant

## 2017-04-24 DIAGNOSIS — I480 Paroxysmal atrial fibrillation: Secondary | ICD-10-CM

## 2017-04-24 DIAGNOSIS — R922 Inconclusive mammogram: Secondary | ICD-10-CM

## 2017-04-24 DIAGNOSIS — R921 Mammographic calcification found on diagnostic imaging of breast: Secondary | ICD-10-CM

## 2017-05-03 ENCOUNTER — Other Ambulatory Visit: Payer: Self-pay | Admitting: Family Medicine

## 2017-05-03 ENCOUNTER — Ambulatory Visit
Admission: RE | Admit: 2017-05-03 | Discharge: 2017-05-03 | Disposition: A | Discharge status: Home / Self Care | Payer: Medicare Other | Source: Ambulatory Visit | Attending: Family Medicine | Admitting: Family Medicine

## 2017-05-03 DIAGNOSIS — R922 Inconclusive mammogram: Secondary | ICD-10-CM

## 2017-05-03 DIAGNOSIS — R921 Mammographic calcification found on diagnostic imaging of breast: Secondary | ICD-10-CM

## 2017-05-03 DIAGNOSIS — Z0000 Encounter for general adult medical examination without abnormal findings: Secondary | ICD-10-CM

## 2017-06-20 ENCOUNTER — Other Ambulatory Visit: Payer: Self-pay | Admitting: *Deleted

## 2017-06-20 MED ORDER — AMLODIPINE BESYLATE 10 MG PO TABS: 10.0000 mg | ORAL_TABLET | Freq: Every day | ORAL | 2 refills | Status: DC

## 2017-08-21 ENCOUNTER — Other Ambulatory Visit: Payer: Self-pay | Admitting: Physician Assistant

## 2017-08-27 ENCOUNTER — Other Ambulatory Visit: Payer: Self-pay | Admitting: Cardiovascular Disease

## 2017-08-27 NOTE — Telephone Encounter (Signed)
New [place]ssage  Pt call requesting to speak with RN about getting a medication refilled. Pt does not know the name of the medicating she needs refilled. Pt states a requesting was sent from her pharmacy last week .please call back to discuss

## 2017-08-28 NOTE — Telephone Encounter (Signed)
Called pt and pt stated that she has already called her pharmacy to request the medication that she needs. I advised the pt that if she has any other problems, questions or concerns to call the office. Pt verbalized understanding.

## 2017-09-03 ENCOUNTER — Ambulatory Visit: Payer: Medicare Other

## 2017-09-10 ENCOUNTER — Other Ambulatory Visit: Payer: Self-pay | Admitting: Cardiovascular Disease

## 2017-09-20 ENCOUNTER — Encounter: Payer: Self-pay | Admitting: Physician Assistant

## 2017-09-24 ENCOUNTER — Other Ambulatory Visit: Payer: Self-pay

## 2017-09-24 ENCOUNTER — Ambulatory Visit (HOSPITAL_COMMUNITY): Payer: Medicare Other | Attending: Internal Medicine

## 2017-09-24 ENCOUNTER — Other Ambulatory Visit: Payer: Self-pay | Admitting: Physician Assistant

## 2017-09-24 DIAGNOSIS — K219 Gastro-esophageal reflux disease without esophagitis: Secondary | ICD-10-CM

## 2017-09-24 DIAGNOSIS — I313 Pericardial effusion (noninflammatory): Secondary | ICD-10-CM | POA: Insufficient documentation

## 2017-09-24 DIAGNOSIS — I051 Rheumatic mitral insufficiency: Secondary | ICD-10-CM | POA: Insufficient documentation

## 2017-09-24 DIAGNOSIS — I350 Nonrheumatic aortic (valve) stenosis: Secondary | ICD-10-CM | POA: Diagnosis present

## 2017-09-24 DIAGNOSIS — I420 Dilated cardiomyopathy: Secondary | ICD-10-CM | POA: Insufficient documentation

## 2017-10-12 ENCOUNTER — Ambulatory Visit: Payer: Medicare Other | Admitting: Physician Assistant

## 2017-10-29 ENCOUNTER — Encounter (INDEPENDENT_AMBULATORY_CARE_PROVIDER_SITE_OTHER): Payer: Self-pay

## 2017-10-29 ENCOUNTER — Encounter: Payer: Self-pay | Admitting: Physician Assistant

## 2017-10-29 ENCOUNTER — Ambulatory Visit: Payer: Medicare Other | Admitting: Physician Assistant

## 2017-10-29 VITALS — BP 134/70 | HR 68 | Ht 64.0 in | Wt 210.0 lb

## 2017-10-29 DIAGNOSIS — I119 Hypertensive heart disease without heart failure: Secondary | ICD-10-CM | POA: Diagnosis not present

## 2017-10-29 DIAGNOSIS — I350 Nonrheumatic aortic (valve) stenosis: Secondary | ICD-10-CM

## 2017-10-29 DIAGNOSIS — I481 Persistent atrial fibrillation: Secondary | ICD-10-CM | POA: Diagnosis not present

## 2017-10-29 DIAGNOSIS — I4819 Other persistent atrial fibrillation: Secondary | ICD-10-CM

## 2017-10-29 NOTE — Progress Notes (Addendum)
Cardiology Office Note:    Date:  10/29/2017   ID:  [name], DOB 05/02/1931, MRN [id number]  PCP:  Hayden Rasmussen, MD  Cardiologist:  Sherren Mocha, MD  Nephrology:  Dr. Moshe Cipro  Referring MD: Hayden Rasmussen, MD   Chief Complaint  Patient presents with  . Follow-up    AFib, aortic stenosis    History of Present Illness:    [name] is a 84 y.o. female with a hx of PAF, HTN, aortic stenosis, HL, diabetes. She is allergic to lisinopril with suspected angioedema. CHADS2-VASc=5 (HTN, DM, female, 84 yo).  She is on Apixaban for anticoagulation.  She saw Dr. Sherren Mocha in 09/2016.  Last echocardiogram in 03/2017 demonstrated worsening AV gradients (approaching severe aortic stenosis).    Last seen June 2018.  Follow-up echo 12/18 demonstrated stable aortic valve gradients.  [name] returns for follow-up.  She is here alone.  She is able to do activities around her house  Without chest symptoms or significant shortness of breath.  She denies orthopnea, PND or significant edema.  She denies syncope.  Prior CV studies:   The following studies were reviewed today:  Echo 09/24/17 Vigorous LVF, EF 65-70, moderate aortic stenosis (mean 32, peak 59; LVOT/AV mean 0.22), mild MR, mild LAE, PASP 53, small pericardial effusion  Echo 04/09/17 Mild concentric LVH, EF 60-65, normal wall motion, moderate to severe aortic stenosis (mean 32, peak 55; LVOT/AV velocity - 0.17), mild AI, MAC, mild MR, severe LAE, moderate TR, PASP 50, trivial pericardial effusion  Echo 03/06/16 Mild conc LVH, EF 55-60, no RWMA, Gr 3 DD, mod AS (mena 27), mod AI, mild MR, severe LAE, mild RAE, mild to mod TR, PASP 42  Echo 12/16/14 Mild concentric LVH, EF 60-65%, normal wall motion, grade 2 diastolic dysfunction, moderate to severe aortic stenosis (mean gradient 29 mmHg, peak gradient 57 mmHg), mild AI, MAC, mild MR, mild LAE, mild to moderate TR, PASP 32 mmHg, trivial pericardial  effusion  Past Medical History:  Diagnosis Date  . Aortic stenosis    last echo 12/2009 - AVA is .9cm - EF 55-60%  . Dizziness    when standing too quickly - no syncope  . Exogenous obesity   . Family history of headaches   . Heart murmur   . History of echocardiogram    Echo 5/17: Mild concentric LVH, EF 55-60%, normal wall motion, grade 3 diastolic dysfunction, moderate aortic stenosis (mean gradient 27 mmHg), moderate AI, mild MR, severe LAE, normal RV function, mild RAE, mild to moderate TR, PASP 42 mmHg, trivial pericardial effusion  . Hyperlipidemia   . Hypertension   . Hypothyroidism   . LVH (left ventricular hypertrophy)    with diastolic dysfunction  . Type II diabetes mellitus ([place])     Past Surgical History:  Procedure Laterality Date  . LAPAROSCOPIC CHOLECYSTECTOMY  2008    Current Medications: Current Meds  Medication Sig  . acetaminophen (TYLENOL) 500 MG tablet Take 1,000 mg by mouth every 6 (six) hours as needed for mild pain, moderate pain or headache.  Marland Kitchen amLODipine (NORVASC) 10 MG tablet Take 1 tablet (10 mg total) by mouth daily.  Marland Kitchen apixaban (ELIQUIS) 5 MG TABS tablet Take 1 tablet (5 mg total) by mouth 2 (two) times daily.  Marland Kitchen b complex-vitamin c-folic acid (NEPHRO-VITE) 0.8 MG TABS tablet Take 1 tablet by mouth at bedtime.  . hydrochlorothiazide (HYDRODIURIL) 25 MG tablet TAKE 1 TABLET BY MOUTH DAILY AS NEEDED  .  levothyroxine (SYNTHROID, LEVOTHROID) 75 MCG tablet Take 75 mcg by mouth daily.  Marland Kitchen losartan (COZAAR) 50 MG tablet Take 50 mg by mouth daily.  . metFORMIN (GLUCOPHAGE) 500 MG tablet Take 500 mg by mouth 2 (two) times daily with a meal.  . metoprolol tartrate (LOPRESSOR) 50 MG tablet TAKE 1/2 TABLET BY MOUTH TWICE DAILY  . omeprazole (PRILOSEC) 40 MG capsule TAKE 1 CAPSULE BY MOUTH DAILY AS NEEDED  . rosuvastatin (CRESTOR) 10 MG tablet Take 1 tablet (10 mg total) every other day by mouth.  . spironolactone (ALDACTONE) 25 MG tablet TAKE 1/2 TABLET BY  MOUTH DAILY     Allergies:   Lisinopril   Social History   Tobacco Use  . Smoking status: Never Smoker  . Smokeless tobacco: Never Used  Substance Use Topics  . Alcohol use: No  . Drug use: No     Family Hx: The patient's family history includes Diabetes in her sister, sister, and sister; Heart attack in her father and sister; Hypertension in her sister; Kidney failure in her sister.  ROS:   Please see the history of present illness.    ROS All other systems reviewed and are negative.   EKGs/Labs/Other Test Reviewed:    EKG:  EKG is  ordered today.  The ekg ordered today demonstrates normal sinus rhythm, HR 68, normal axis, T wave inversions 1, 2, aVL, V4-V6, similar to prior tracings  Recent Labs: 04/13/2017: BUN 24; Creatinine, Ser 1.24; Hemoglobin 12.9; Platelets 265; Potassium 4.3; Sodium 142   Recent Lipid Panel Lab Results  Component Value Date/Time   CHOL 141 08/25/2015 09:35 AM   TRIG 113 08/25/2015 09:35 AM   HDL 52 08/25/2015 09:35 AM   CHOLHDL 2.7 08/25/2015 09:35 AM   LDLCALC 66 08/25/2015 09:35 AM    Physical Exam:    VS:  BP 134/70   Pulse 68   Ht _0  (1.626 m)   Wt 210 lb (95.3 kg)   SpO2 93%   BMI 36.05 kg/m     Wt Readings from Last 3 Encounters:  10/29/17 210 lb (95.3 kg)  04/13/17 207 lb (93.9 kg)  10/09/16 209 lb 6.4 oz (95 kg)     Physical Exam  Constitutional: She is oriented to person, place, and time. She appears well-developed and well-nourished. No distress.  HENT:  Head: Normocephalic and atraumatic.  Eyes: No scleral icterus.  Neck: No JVD present.  Cardiovascular: Normal rate and regular rhythm.  Murmur heard.  Harsh crescendo-decrescendo systolic murmur is present with a grade of 2/6 at the upper right sternal border. Pulmonary/Chest: Effort normal. She has no rales.  Abdominal: Soft. She exhibits no distension.  Musculoskeletal: She exhibits edema ( Nonpitting bilateral ankle edema).  Neurological: She is alert and  oriented to person, place, and time.  Skin: Skin is warm and dry.    ASSESSMENT:    1. Nonrheumatic aortic valve stenosis   2. Persistent atrial fibrillation ([place])   3. Benign hypertensive heart disease without heart failure    PLAN:    In order of problems listed above:  1.  Nonrheumatic aortic valve stenosis Echocardiogram last month demonstrated mean gradient 32 mmHg.  LVOT/AV ratio is 0.22.  She is not having any symptoms of chest pain, syncope or heart failure.  Follow-up again in 6 months.  2. Persistent atrial fibrillation ([place]) She is in normal sinus rhythm today.  Continue apixaban for anticoagulation.  Creatinine in June 2018 was 1.24, therefore she remains on Eliquis 5  mg twice daily.  Repeat BMET CBC today.  3. Benign hypertensive heart disease without heart failure The patient's blood pressure is controlled on her current regimen.  Continue current therapy.    Dispo:  Return in about 6 months (around 04/28/2018) for Routine Follow Up, w/ Dr. Burt Knack.   Medication Adjustments/Labs and Tests Ordered: Current medicines are reviewed at length with the patient today.  Concerns regarding medicines are outlined above.  Tests Ordered: Orders Placed This Encounter  Procedures  . Basic Metabolic Panel (BMET)  . CBC  . EKG 12-Lead   Medication Changes: No orders of the defined types were placed in this encounter.   Signed, Richardson Dopp, PA-C  10/29/2017 4:22 PM    [facility] [place], [place], [place]  [address] Phone: [phone]; Fax: [phone]

## 2017-10-29 NOTE — Patient Instructions (Signed)
Medication Instructions:  1. Your physician recommends that you continue on your current medications as directed. Please refer to the Current Medication list given to you today.    Labwork: 1. TODAY BMET, CBC   Testing/Procedures: NONE ORDERED TODAY  Follow-Up: Your physician wants you to follow-up in: 6 MONTHS WITH DR. Theodoro ParmaOOPER  You will receive a reminder letter in the mail two months in advance. If you don't receive a letter, please call our office to schedule the follow-up appointment.   Any Other Special Instructions Will Be Listed Below (If Applicable).     If you need a refill on your cardiac medications before your next appointment, please call your pharmacy.

## 2017-10-29 NOTE — Addendum Note (Signed)
Addended byAlben Spittle: WEAVER, Lorin PicketSCOTT T on: 10/29/2017 04:22 PM   Modules accepted: Orders

## 2017-10-30 LAB — BASIC METABOLIC PANEL
BUN / CREAT RATIO: 15 (ref 12–28)
BUN: 17 mg/dL (ref 8–27)
CO2: 23 mmol/L (ref 20–29)
CREATININE: 1.13 mg/dL — AB (ref 0.57–1.00)
Calcium: 10.4 mg/dL — ABNORMAL HIGH (ref 8.7–10.3)
Chloride: 104 mmol/L (ref 96–106)
GFR calc non Af Amer: 45 mL/min/{1.73_m2} — ABNORMAL LOW (ref >59)
GFR, EST AFRICAN AMERICAN: 52 mL/min/{1.73_m2} — AB (ref >59)
GLUCOSE: 120 mg/dL — AB (ref 65–99)
Potassium: 4.4 mmol/L (ref 3.5–5.2)
SODIUM: 145 mmol/L — AB (ref 134–144)

## 2017-10-30 LAB — CBC
Hematocrit: 37.7 % (ref 34.0–46.6)
Hemoglobin: 12.1 g/dL (ref 11.1–15.9)
MCH: 28.8 pg (ref 26.6–33.0)
MCHC: 32.1 g/dL (ref 31.5–35.7)
MCV: 90 fL (ref 79–97)
Platelets: 255 10*3/uL (ref 150–379)
RBC: 4.2 x10E6/uL (ref 3.77–5.28)
RDW: 13.8 % (ref 12.3–15.4)
WBC: 6.3 10*3/uL (ref 3.4–10.8)

## 2018-04-22 ENCOUNTER — Other Ambulatory Visit: Payer: Self-pay | Admitting: Cardiovascular Disease

## 2018-04-23 ENCOUNTER — Other Ambulatory Visit: Payer: Self-pay | Admitting: Cardiovascular Disease

## 2018-04-23 ENCOUNTER — Other Ambulatory Visit: Payer: Self-pay | Admitting: Physician Assistant

## 2018-04-23 DIAGNOSIS — K219 Gastro-esophageal reflux disease without esophagitis: Secondary | ICD-10-CM

## 2018-04-23 DIAGNOSIS — I480 Paroxysmal atrial fibrillation: Secondary | ICD-10-CM

## 2018-05-17 ENCOUNTER — Other Ambulatory Visit: Payer: Self-pay | Admitting: Physician Assistant

## 2018-06-17 ENCOUNTER — Ambulatory Visit: Payer: Medicare Other | Admitting: Cardiovascular Disease

## 2018-06-17 ENCOUNTER — Encounter (INDEPENDENT_AMBULATORY_CARE_PROVIDER_SITE_OTHER): Payer: Self-pay

## 2018-06-17 ENCOUNTER — Encounter: Payer: Self-pay | Admitting: Cardiovascular Disease

## 2018-06-17 VITALS — BP 140/70 | HR 67 | Ht 64.0 in | Wt 201.8 lb

## 2018-06-17 DIAGNOSIS — I350 Nonrheumatic aortic (valve) stenosis: Secondary | ICD-10-CM | POA: Diagnosis not present

## 2018-06-17 DIAGNOSIS — I480 Paroxysmal atrial fibrillation: Secondary | ICD-10-CM | POA: Diagnosis not present

## 2018-06-17 DIAGNOSIS — I119 Hypertensive heart disease without heart failure: Secondary | ICD-10-CM | POA: Diagnosis not present

## 2018-06-17 MED ORDER — METOPROLOL TARTRATE 25 MG PO TABS: 25.0000 mg | ORAL_TABLET | Freq: Two times a day (BID) | ORAL | 3 refills | Status: DC

## 2018-06-17 MED ORDER — HYDROCHLOROTHIAZIDE 12.5 MG PO TABS: 12.5000 mg | ORAL_TABLET | Freq: Every day | ORAL | 3 refills | Status: DC

## 2018-06-17 MED ORDER — APIXABAN 5 MG PO TABS: 5.0000 mg | ORAL_TABLET | Freq: Two times a day (BID) | ORAL | 3 refills | Status: DC

## 2018-06-17 MED ORDER — LOSARTAN POTASSIUM 50 MG PO TABS: 50.0000 mg | ORAL_TABLET | Freq: Every day | ORAL | 3 refills | Status: DC

## 2018-06-17 MED ORDER — SPIRONOLACTONE 25 MG PO TABS: 12.5000 mg | ORAL_TABLET | Freq: Every day | ORAL | 3 refills | Status: DC

## 2018-06-17 MED ORDER — AMLODIPINE BESYLATE 10 MG PO TABS: 10.0000 mg | ORAL_TABLET | Freq: Every day | ORAL | 6 refills | Status: DC

## 2018-06-17 MED ORDER — ROSUVASTATIN CALCIUM 10 MG PO TABS: 10.0000 mg | ORAL_TABLET | ORAL | 3 refills | Status: DC

## 2018-06-17 NOTE — Progress Notes (Signed)
Cardiology Office Note Date:  06/17/2018   ID:  [name], [name] 12-Nov-1930, MRN [id number]  PCP:  Kaleen Mask, MD  Cardiologist:  Tonny Bollman, MD    Chief Complaint  Patient presents with  . Follow-up    aortic valve disease     History of Present Illness: [name] is a 84 y.o. female who presents for follow-up of aortic valve disease and her excisional atrial fibrillation.  The patient is anticoagulated with apixaban.  Her most recent echocardiogram from December 2018 demonstrated findings consistent with moderate aortic stenosis with a mean transvalvular gradient of 32 mmHg.  Patient is doing well.  She is here alone today and continues to function independently.  She is able to do her housework and even some light work in the yard without symptoms.  She does have mild lightheadedness when she first gets up in the morning.  Otherwise no specific complaints.  She denies chest pain, chest pressure, shortness of breath, or heart palpitations.  She is had no lightheadedness or weakness with physical activity or walking.  She follows regularly with Dr. Jeannetta Nap and had labs drawn a few months back by her report.  She denies any bleeding problems on apixaban.  Past Medical History:  Diagnosis Date  . Aortic stenosis    last echo 12/2009 - AVA is .9cm - EF 55-60%  . Dizziness    when standing too quickly - no syncope  . Exogenous obesity   . Family history of headaches   . Heart murmur   . History of echocardiogram    Echo 5/17: Mild concentric LVH, EF 55-60%, normal wall motion, grade 3 diastolic dysfunction, moderate aortic stenosis (mean gradient 27 mmHg), moderate AI, mild MR, severe LAE, normal RV function, mild RAE, mild to moderate TR, PASP 42 mmHg, trivial pericardial effusion  . Hyperlipidemia   . Hypertension   . Hypothyroidism   . LVH (left ventricular hypertrophy)    with diastolic dysfunction  . Type II diabetes mellitus (HCC)     Past  Surgical History:  Procedure Laterality Date  . LAPAROSCOPIC CHOLECYSTECTOMY  2008    Current Outpatient Medications  Medication Sig Dispense Refill  . acetaminophen (TYLENOL) 500 MG tablet Take 1,000 mg by mouth every 6 (six) hours as needed for mild pain, moderate pain or headache.    Marland Kitchen amLODipine (NORVASC) 10 MG tablet Take 1 tablet (10 mg total) by mouth daily. 30 tablet 6  . apixaban (ELIQUIS) 5 MG TABS tablet Take 1 tablet (5 mg total) by mouth 2 (two) times daily. 180 tablet 3  . b complex-vitamin c-folic acid (NEPHRO-VITE) 0.8 MG TABS tablet Take 1 tablet by mouth at bedtime.    . hydrochlorothiazide (HYDRODIURIL) 12.5 MG tablet Take 1 tablet (12.5 mg total) by mouth daily. 90 tablet 3  . levothyroxine (SYNTHROID, LEVOTHROID) 75 MCG tablet Take 75 mcg by mouth daily.    Marland Kitchen losartan (COZAAR) 50 MG tablet Take 1 tablet (50 mg total) by mouth daily. 90 tablet 3  . metFORMIN (GLUCOPHAGE) 500 MG tablet Take 1,000 mg by mouth daily. Take half tablet daily    . metoprolol tartrate (LOPRESSOR) 25 MG tablet Take 1 tablet (25 mg total) by mouth 2 (two) times daily. 180 tablet 3  . rosuvastatin (CRESTOR) 10 MG tablet Take 1 tablet (10 mg total) by mouth every other day. 45 tablet 3  . spironolactone (ALDACTONE) 25 MG tablet Take 0.5 tablets (12.5 mg total) by mouth daily. 45  tablet 3   No current facility-administered medications for this visit.     Allergies:   Lisinopril   Social History:  The patient  reports that she has never smoked. She has never used smokeless tobacco. She reports that she does not drink alcohol or use drugs.   Family History:  The patient's family history includes Diabetes in her sister, sister, and sister; Heart attack in her father and sister; Hypertension in her sister; Kidney failure in her sister.    ROS:  Please see the history of present illness.   All other systems are reviewed and negative.    PHYSICAL EXAM: VS:  BP 140/70   Pulse 67   Ht 5\' 4"  (1.626  m)   Wt 201 lb 12.8 oz (91.5 kg)   SpO2 97%   BMI 34.64 kg/m  , BMI Body mass index is 34.64 kg/m. GEN: Well nourished, well developed, in no acute distress  HEENT: normal  Neck: no JVD, no masses. Leftcarotid bruit Cardiac: RRR with 3/6 harsh mid-late peaking systolic murmur at the RUSB, no diastolic murmur                Respiratory:  clear to auscultation bilaterally, normal work of breathing GI: soft, nontender, nondistended, + BS MS: no deformity or atrophy  Ext: no pretibial edema, pedal pulses 2+= bilaterally Skin: warm and dry, no rash Neuro:  Strength and sensation are intact Psych: euthymic mood, full affect  EKG:  EKG is not ordered today.  Recent Labs: 10/29/2017: BUN 17; Creatinine, Ser 1.13; Hemoglobin 12.1; Platelets 255; Potassium 4.4; Sodium 145   Lipid Panel     Component Value Date/Time   CHOL 141 08/25/2015 0935   TRIG 113 08/25/2015 0935   HDL 52 08/25/2015 0935   CHOLHDL 2.7 08/25/2015 0935   VLDL 23 08/25/2015 0935   LDLCALC 66 08/25/2015 0935      Wt Readings from Last 3 Encounters:  06/17/18 201 lb 12.8 oz (91.5 kg)  10/29/17 210 lb (95.3 kg)  04/13/17 207 lb (93.9 kg)     Cardiac Studies Reviewed: 2D Echo 09-24-2017: Left ventricle:  The cavity size was normal. Wall thickness was normal. Systolic function was vigorous. The estimated ejection fraction was in the range of 65% to 70%.  ------------------------------------------------------------------- Aortic valve:  AV is thickened, calcified with restricted motion. Peak and mean gradients through the valve are 56 and 30 mm Hg respectively consistent with moderate AS.  Doppler:     VTI ratio of LVOT to aortic valve: 0.21. Valve area (VTI): 0.67 cm^2. Indexed valve area (VTI): 0.32 cm^2/m^2. Peak velocity ratio of LVOT to aortic valve: 0.21. Valve area (Vmax): 0.67 cm^2. Indexed valve area (Vmax): 0.32 cm^2/m^2. Mean velocity ratio of LVOT to aortic valve: 0.22. Valve area (Vmean): 0.7 cm^2.  Indexed valve area (Vmean): 0.33 cm^2/m^2.    Mean gradient (S): 32 mm Hg. Peak gradient (S): 59 mm Hg.  ------------------------------------------------------------------- Mitral valve:   Calcified annulus. Mildly thickened leaflets . Doppler:  There was mild regurgitation.    Peak gradient (D): 5 mm Hg.  ------------------------------------------------------------------- Left atrium:  The atrium was mildly dilated.  ------------------------------------------------------------------- Pulmonic valve:    Structurally normal valve.   Cusp separation was normal.  Doppler:  Transvalvular velocity was within the normal range. There was trivial regurgitation.  ------------------------------------------------------------------- Tricuspid valve:   Doppler:  There was mild regurgitation.  ------------------------------------------------------------------- Right atrium:  The atrium was normal in size.  ------------------------------------------------------------------- Pericardium:  A small pericardial effusion  was identified.  ------------------------------------------------------------------- Systemic veins: Inferior vena cava: The vessel was normal in size. The respirophasic diameter changes were in the normal range (>= 50%), consistent with normal central venous pressure.   ASSESSMENT AND PLAN: 1.  Aortic stenosis, stage C disease, moderate to severe.  The patient's exam and echo findings are consistent with at least moderate aortic stenosis.  She appears to be completely asymptomatic.  I reviewed potential symptoms of aortic stenosis with her in some detail today.  I will see her back in 6 months with a repeat echocardiogram.  She understands to contact our office if she develops any chest discomfort, shortness of breath, or lightheadedness.  2.  Paroxysmal atrial fibrillation: Appears to be maintaining sinus rhythm.  Tolerating apixaban without problems.  Specifically denies  any symptoms of gastrointestinal bleeding.  Most recent labs are reviewed.  3.  Hypertension: Continue losartan, Spironolactone, hydrochlorothiazide, metoprolol, and amlodipine.  4.  Hyperlipidemia, mixed: Treated with low-dose rosuvastatin.  Followed by her primary physician.  Current medicines are reviewed with the patient today.  The patient does not have concerns regarding medicines.  Labs/ tests ordered today include:   Orders Placed This Encounter  Procedures  . CBC with Differential/Platelet  . Comprehensive metabolic panel  . ECHOCARDIOGRAM COMPLETE    Disposition:   FU 6 months with labs and an echo prior to the visit  Signed, Tonny BollmanMichael Taytem Ghattas, MD  06/17/2018 1:32 PM    [facility] [address], [place], [place]  [address] Phone: [phone]; Fax: [phone]

## 2018-06-17 NOTE — Patient Instructions (Signed)
Medication Instructions:  Your provider recommends that you continue on your current medications as directed. Please refer to the Current Medication list given to you today.    Labwork: Your provider recommends that you return for fasting lab work in 6 months, prior to your appointment with Dr. Excell Seltzerooper.  Testing/Procedures: Your provider has requested that you have an echocardiogram in 6 months. Echocardiography is a painless test that uses sound waves to create images of your heart. It provides your doctor with information about the size and shape of your heart and how well your heart's chambers and valves are working. This procedure takes approximately one hour. There are no restrictions for this procedure.    Follow-Up: You will be called to arrange your 6 month lab work, echo, and appointment with Dr. Excell Seltzerooper.  Any Other Special Instructions Will Be Listed Below (If Applicable).     If you need a refill on your cardiac medications before your next appointment, please call your pharmacy.

## 2018-07-25 ENCOUNTER — Telehealth: Payer: Self-pay

## 2018-07-25 NOTE — Telephone Encounter (Signed)
Scheduled patient for 6 mo echo and labs 12/16/2018. She understands she will review results with Dr. Excell Seltzer at visit 12/19/2018.  She was grateful for call and agrees with treatment plan.

## 2018-09-09 ENCOUNTER — Other Ambulatory Visit: Payer: Self-pay | Admitting: Physician Assistant

## 2018-09-09 MED ORDER — HYDROCHLOROTHIAZIDE 12.5 MG PO TABS: 12.5000 mg | ORAL_TABLET | Freq: Every day | ORAL | 2 refills | Status: DC

## 2018-12-16 ENCOUNTER — Other Ambulatory Visit: Payer: Medicare Other

## 2018-12-16 ENCOUNTER — Ambulatory Visit (HOSPITAL_COMMUNITY): Payer: Medicare Other | Attending: Cardiology

## 2018-12-16 DIAGNOSIS — I350 Nonrheumatic aortic (valve) stenosis: Secondary | ICD-10-CM

## 2018-12-16 LAB — COMPREHENSIVE METABOLIC PANEL
ALBUMIN: 4.5 g/dL (ref 3.6–4.6)
ALT: 11 IU/L (ref 0–32)
AST: 15 IU/L (ref 0–40)
Albumin/Globulin Ratio: 1.7 (ref 1.2–2.2)
Alkaline Phosphatase: 41 IU/L (ref 39–117)
BUN / CREAT RATIO: 14 (ref 12–28)
BUN: 21 mg/dL (ref 8–27)
Bilirubin Total: 0.4 mg/dL (ref 0.0–1.2)
CALCIUM: 10.2 mg/dL (ref 8.7–10.3)
CO2: 23 mmol/L (ref 20–29)
CREATININE: 1.45 mg/dL — AB (ref 0.57–1.00)
Chloride: 100 mmol/L (ref 96–106)
GFR calc non Af Amer: 33 mL/min/{1.73_m2} — ABNORMAL LOW (ref >59)
GFR, EST AFRICAN AMERICAN: 38 mL/min/{1.73_m2} — AB (ref >59)
GLUCOSE: 85 mg/dL (ref 65–99)
Globulin, Total: 2.7 g/dL (ref 1.5–4.5)
Potassium: 4.2 mmol/L (ref 3.5–5.2)
Sodium: 139 mmol/L (ref 134–144)
TOTAL PROTEIN: 7.2 g/dL (ref 6.0–8.5)

## 2018-12-16 LAB — CBC WITH DIFFERENTIAL/PLATELET
BASOS ABS: 0.1 10*3/uL (ref 0.0–0.2)
Basos: 1 %
EOS (ABSOLUTE): 0.1 10*3/uL (ref 0.0–0.4)
Eos: 2 %
HEMOGLOBIN: 12.4 g/dL (ref 11.1–15.9)
Hematocrit: 38.7 % (ref 34.0–46.6)
IMMATURE GRANS (ABS): 0 10*3/uL (ref 0.0–0.1)
IMMATURE GRANULOCYTES: 0 %
LYMPHS: 39 %
Lymphocytes Absolute: 2 10*3/uL (ref 0.7–3.1)
MCH: 27.7 pg (ref 26.6–33.0)
MCHC: 32 g/dL (ref 31.5–35.7)
MCV: 87 fL (ref 79–97)
Monocytes Absolute: 0.6 10*3/uL (ref 0.1–0.9)
Monocytes: 12 %
NEUTROS ABS: 2.4 10*3/uL (ref 1.4–7.0)
Neutrophils: 46 %
Platelets: 261 10*3/uL (ref 150–450)
RBC: 4.47 x10E6/uL (ref 3.77–5.28)
RDW: 13.4 % (ref 11.7–15.4)
WBC: 5.2 10*3/uL (ref 3.4–10.8)

## 2018-12-19 ENCOUNTER — Encounter: Payer: Self-pay | Admitting: Cardiovascular Disease

## 2018-12-19 ENCOUNTER — Ambulatory Visit: Payer: Medicare Other | Admitting: Cardiovascular Disease

## 2018-12-19 VITALS — BP 140/66 | HR 58 | Ht 64.0 in | Wt 194.0 lb

## 2018-12-19 DIAGNOSIS — I10 Essential (primary) hypertension: Secondary | ICD-10-CM

## 2018-12-19 DIAGNOSIS — I480 Paroxysmal atrial fibrillation: Secondary | ICD-10-CM

## 2018-12-19 DIAGNOSIS — I350 Nonrheumatic aortic (valve) stenosis: Secondary | ICD-10-CM | POA: Diagnosis not present

## 2018-12-19 DIAGNOSIS — E782 Mixed hyperlipidemia: Secondary | ICD-10-CM | POA: Diagnosis not present

## 2018-12-19 NOTE — Progress Notes (Signed)
Cardiology Office Note:    Date:  12/20/2018   ID:  [name], DOB November 24, 1930, MRN [id number]  PCP:  Kaleen Mask, MD  Cardiologist:  Tonny Bollman, MD  Electrophysiologist:  None   Referring MD: Kaleen Mask, *   Chief Complaint  Patient presents with  . Shortness of Breath    History of Present Illness:    [name] is a 85 y.o. female with a hx of aortic stenosis and paroxysmal atrial fibrillation, presenting for follow-up evaluation.  The patient has tolerated long-term oral anticoagulation with apixaban.  She has had moderate aortic stenosis followed with serial echo studies.  The patient had a recent surveillance echocardiogram demonstrating progression of her aortic stenosis, now clearly into the severe range with a peak systolic velocity of 4.1 m/s, peak and mean transvalvular gradients of 65 and 40 mmHg, respectively, and an aortic valve dimensionless index of 0.14.  The patient is here alone today.  She reports no significant change in symptoms.  She does admit to mild shortness of breath when she gets in a hurry, but has no symptoms with all of her normal activities including light housework.  She has had no chest pain or pressure.  She has longstanding episodes of lightheadedness when she first wakes up in the morning.  She has no exertional lightheadedness or history of syncope.  She has had no bleeding or bruising problems on apixaban.  Past Medical History:  Diagnosis Date  . Aortic stenosis    last echo 12/2009 - AVA is .9cm - EF 55-60%  . Dizziness    when standing too quickly - no syncope  . Exogenous obesity   . Family history of headaches   . Heart murmur   . History of echocardiogram    Echo 5/17: Mild concentric LVH, EF 55-60%, normal wall motion, grade 3 diastolic dysfunction, moderate aortic stenosis (mean gradient 27 mmHg), moderate AI, mild MR, severe LAE, normal RV function, mild RAE, mild to moderate TR, PASP 42 mmHg,  trivial pericardial effusion  . Hyperlipidemia   . Hypertension   . Hypothyroidism   . LVH (left ventricular hypertrophy)    with diastolic dysfunction  . Type II diabetes mellitus (HCC)     Past Surgical History:  Procedure Laterality Date  . LAPAROSCOPIC CHOLECYSTECTOMY  2008    Current Medications: Current Meds  Medication Sig  . acetaminophen (TYLENOL) 500 MG tablet Take 1,000 mg by mouth every 6 (six) hours as needed for mild pain, moderate pain or headache.  Marland Kitchen amLODipine (NORVASC) 10 MG tablet Take 1 tablet (10 mg total) by mouth daily.  Marland Kitchen apixaban (ELIQUIS) 5 MG TABS tablet Take 1 tablet (5 mg total) by mouth 2 (two) times daily.  . hydrochlorothiazide (HYDRODIURIL) 12.5 MG tablet Take 1 tablet (12.5 mg total) by mouth daily.  Marland Kitchen levothyroxine (SYNTHROID, LEVOTHROID) 75 MCG tablet Take 75 mcg by mouth daily.  Marland Kitchen losartan (COZAAR) 50 MG tablet Take 1 tablet (50 mg total) by mouth daily.  . metFORMIN (GLUCOPHAGE) 500 MG tablet Take 250 mg by mouth 2 (two) times daily with a meal.   . metoprolol tartrate (LOPRESSOR) 25 MG tablet Take 1 tablet (25 mg total) by mouth 2 (two) times daily.  . rosuvastatin (CRESTOR) 10 MG tablet Take 1 tablet (10 mg total) by mouth every other day.  . spironolactone (ALDACTONE) 25 MG tablet Take 0.5 tablets (12.5 mg total) by mouth daily.     Allergies:   Lisinopril  Social History   Socioeconomic History  . Marital status: Widowed    Spouse name: Not on file  . Number of children: Not on file  . Years of education: Not on file  . Highest education level: Not on file  Occupational History  . Not on file  Social Needs  . Financial resource strain: Not on file  . Food insecurity:    Worry: Not on file    Inability: Not on file  . Transportation needs:    Medical: Not on file    Non-medical: Not on file  Tobacco Use  . Smoking status: Never Smoker  . Smokeless tobacco: Never Used  Substance and Sexual Activity  . Alcohol use: No  .  Drug use: No  . Sexual activity: Not on file  Lifestyle  . Physical activity:    Days per week: Not on file    Minutes per session: Not on file  . Stress: Not on file  Relationships  . Social connections:    Talks on phone: Not on file    Gets together: Not on file    Attends religious service: Not on file    Active member of club or organization: Not on file    Attends meetings of clubs or organizations: Not on file    Relationship status: Not on file  Other Topics Concern  . Not on file  Social History Narrative   6 grandkids   4 great grandkids   2 great great grandkids     Family History: The patient's family history includes Diabetes in her sister, sister, and sister; Heart attack in her father and sister; Hypertension in her sister; Kidney failure in her sister.  ROS:   Please see the history of present illness.    All other systems reviewed and are negative.  EKGs/Labs/Other Studies Reviewed:    The following studies were reviewed today: Echo 12/16/2018: IMPRESSIONS    1. The left ventricle has normal systolic function with an ejection fraction of 60-65%. The cavity size was normal. Left ventricular diastolic Doppler parameters are indeterminate.  2. The right ventricle has normal systolic function. The cavity was normal. There is no increase in right ventricular wall thickness. Right ventricular systolic pressure is mildly elevated with an estimated pressure of 39.4 mmHg.  3. Left atrial size was severely dilated.  4. Trivial pericardial effusion is present.  5. The mitral valve is normal in structure.  6. The tricuspid valve is normal in structure. Tricuspid valve regurgitation is moderate.  7. The aortic valve is tricuspid Severely thickening of the aortic valve Aortic valve regurgitation is trivial by color flow Doppler. severe stenosis of the aortic valve.  8. The pulmonic valve was normal in structure.  9. The aortic root is normal in size and  structure. 10. Normal LV function; severe AS (peak velocity 4.1 m/s and mean gradient 40 mmHg); trace AI; mild MR; severe LAE; moderate TR; mild pulmonary hypertension.  FINDINGS  Left Ventricle: The left ventricle has normal systolic function, with an ejection fraction of 60-65%. The cavity size was normal. There is no increase in left ventricular wall thickness. Left ventricular diastolic Doppler parameters are indeterminate Right Ventricle: The right ventricle has normal systolic function. The cavity was normal. There is no increase in right ventricular wall thickness. Right ventricular systolic pressure is mildly elevated with an estimated pressure of 39.4 mmHg. Left Atrium: left atrial size was severely dilated Right Atrium: right atrial size was normal in size. Right  atrial pressure is estimated at 3 mmHg. Interatrial Septum: No atrial level shunt detected by color flow Doppler. Pericardium: Trivial pericardial effusion is present. Mitral Valve: The mitral valve is normal in structure. Mitral valve regurgitation is mild by color flow Doppler. Tricuspid Valve: The tricuspid valve is normal in structure. Tricuspid valve regurgitation is moderate by color flow Doppler. Aortic Valve: The aortic valve is tricuspid Severely thickening of the aortic valve. Aortic valve regurgitation is trivial by color flow Doppler. There is severe stenosis of the aortic valve, with a calculated valve area of 0.39 cm. Pulmonic Valve: The pulmonic valve was normal in structure. Pulmonic valve regurgitation is trivial by color flow Doppler. Aorta: The aortic root is normal in size and structure. Venous: The inferior vena cava is normal in size with greater than 50% respiratory variability.   LEFT VENTRICLE PLAX 2D (Teich) LV EF:          65.9 %   Diastology LVIDd:          4.51 cm  LV e' lateral:   7.07 cm/s LVIDs:          2.88 cm  LV E/e' lateral: 19.1 LV PW:          1.10 cm  LV e' medial:    9.14 cm/s LV  IVS:         0.97 cm  LV E/e' medial:  14.8 LVOT diam:      1.90 cm LV SV:          61 ml LVOT Area:      2.84 cm  RIGHT VENTRICLE RV S prime:     10.60 cm/s TAPSE (M-mode): 2.2 cm RVSP:           39.4 mmHg  LEFT ATRIUM              Index       RIGHT ATRIUM           Index LA diam:        5.00 cm  2.55 cm/m  RA Pressure: 3 mmHg LA Vol (A2C):   97.9 ml  49.85 ml/m RA Area:     16.80 cm LA Vol (A4C):   132.0 ml 67.22 ml/m RA Volume:   40.70 ml  20.72 ml/m LA Biplane Vol: 123.0 ml 62.63 ml/m  AORTIC VALVE AV Area (Vmax):    0.35 cm AV Area (Vmean):   0.34 cm AV Area (VTI):     0.39 cm AV Vmax:           414.00 cm/s AV Vmean:          277.667 cm/s AV VTI:            1.103 m AV Peak Grad:      65.0 mmHg AV Mean Grad:      40.0 mmHg LVOT Vmax:         50.20 cm/s LVOT Vmean:        33.600 cm/s LVOT VTI:          0.153 m LVOT/AV VTI ratio: 0.14 AR PHT:            892 msec   AORTA Ao Root diam: 3.20 cm Ao Asc diam:  3.20 cm  MITRAL VALVE               TRICUSPID VALVE MV Area (PHT): cm         TR Peak grad:   36.4 mmHg MV PHT:  msec        TR Vmax:        317.00 cm/s MV Decel Time: 222 msec    RVSP:           39.4 mmHg MR Peak grad: 179.0 mmHg MR Mean grad: 109.0 mmHg MR Vmax:      669.00 cm/s MR Vmean:     482.0 cm/s MR PISA:      3.08 MV E velocity: 135.00 cm/s MV A velocity: 41.00 cm/s MV E/A ratio:  3.29  EKG:  EKG is ordered today.  The ekg ordered today demonstrates normal sinus rhythm 58 bpm, T wave abnormality consider inferolateral ischemia, no significant change from previous tracing October 29, 2017.  Recent Labs: 12/16/2018: ALT 11; BUN 21; Creatinine, Ser 1.45; Hemoglobin 12.4; Platelets 261; Potassium 4.2; Sodium 139  Recent Lipid Panel    Component Value Date/Time   CHOL 141 08/25/2015 0935   TRIG 113 08/25/2015 0935   HDL 52 08/25/2015 0935   CHOLHDL 2.7 08/25/2015 0935   VLDL 23 08/25/2015 0935   LDLCALC 66 08/25/2015 0935     Physical Exam:    VS:  BP 140/66   Pulse (!) 58   Ht  (1.626 m)   Wt 194 lb (88 kg)   SpO2 97%   BMI 33.30 kg/m     Wt Readings from Last 3 Encounters:  12/19/18 194 lb (88 kg)  06/17/18 201 lb 12.8 oz (91.5 kg)  10/29/17 210 lb (95.3 kg)     GEN: Well nourished, well developed in no acute distress HEENT: Normal NECK: No JVD; No carotid bruits LYMPHATICS: No lymphadenopathy CARDIAC: RRR, 3/6 harsh late peaking systolic crescendo decrescendo murmur at the right upper sternal border, no diastolic murmur. RESPIRATORY:  Clear to auscultation without rales, wheezing or rhonchi  ABDOMEN: Soft, non-tender, non-distended MUSCULOSKELETAL:  No edema; No deformity  SKIN: Warm and dry NEUROLOGIC:  Alert and oriented x 3 PSYCHIATRIC:  Normal affect   ASSESSMENT:    1. Nonrheumatic aortic valve stenosis   2. Mixed hyperlipidemia   3. Essential hypertension   4. Paroxysmal atrial fibrillation (HCC)    PLAN:    In order of problems listed above:  1.  The patient has severe aortic stenosis.  I have personally reviewed her echo images which demonstrate severe calcification and restriction of all 3 aortic valve leaflets.  Doppler data as reviewed above demonstrates progression of aortic stenosis now with a mean gradient of 40 mmHg, dimensionless index of 0.14, and peak systolic transaortic velocity greater than 4 m/s.  LV function remains normal with an LVEF of 65%. I have reviewed the natural history of aortic stenosis with the patient today. We have discussed the limitations of medical therapy and the poor prognosis associated with symptomatic aortic stenosis. We have reviewed potential treatment options, including palliative medical therapy, conventional surgical aortic valve replacement, and transcatheter aortic valve replacement. We discussed treatment options in the context of the patient's specific comorbid medical conditions.  She has mild shortness of breath with some  activities, but does not seem to have any limitation to all her normal activities.  Considering the progression of her aortic stenosis between most recent studies, I would favor moving forward with evaluation for aortic valve replacement, likely with TAVR.  The patient would like to think this over and talk it over with her family.  She does not seem inclined to proceed at this point.  I asked her to let us know  if her family would like to come in and review things in the near future.  If so we will arrange an appointment.  Otherwise, I will plan to see her back in 6 months for follow-up with an echocardiogram at that time.  We discussed treatment options, and diagnostic work-up that would be required including right and left heart catheterization, CT angiography studies, and cardiac surgical consultation.  Again, she will let us know if she decides to proceed. 2.  The patient is treated with a statin drug.  Lipids are followed by her primary physician. 3.  Blood pressure is controlled on a combination of amlodipine, hydrochlorothiazide, losartan, metoprolol, and spironolactone. 4.  Patient is maintaining sinus rhythm.  She is treated with apixaban for anticoagulation.  Medication Adjustments/Labs and Tests Ordered: Current medicines are reviewed at length with the patient today.  Concerns regarding medicines are outlined above.  Orders Placed This Encounter  Procedures  . EKG 12-Lead  . ECHOCARDIOGRAM COMPLETE   No orders of the defined types were placed in this encounter.   Patient Instructions  Medication Instructions:  Your provider recommends that you continue on your current medications as directed. Please refer to the Current Medication list given to you today.    Labwork: None  Testing/Procedures: None  Follow-Up: Please call us to let us know if you'd like to proceed with TAVR. You have been given a packet of information for your review.  Otherwise, if you choose NOT to proceed  with TAVR at this time, you will be called to arrange a 6 month echo and visit with Dr. Excell Seltzer when his schedule is available.      Signed, Tonny Bollman, MD  12/20/2018 5:36 AM    [facility]

## 2018-12-19 NOTE — Patient Instructions (Signed)
Medication Instructions:  Your provider recommends that you continue on your current medications as directed. Please refer to the Current Medication list given to you today.    Labwork: None  Testing/Procedures: None  Follow-Up: Please call us to let us know if you'd like to proceed with TAVR. You have been given a packet of information for your review.  Otherwise, if you choose NOT to proceed with TAVR at this time, you will be called to arrange a 6 month echo and visit with Dr. Excell Seltzer when his schedule is available.

## 2018-12-20 ENCOUNTER — Encounter: Payer: Self-pay | Admitting: Cardiovascular Disease

## 2018-12-30 ENCOUNTER — Institutional Professional Consult (permissible substitution): Payer: Medicare Other | Admitting: Cardiovascular Disease

## 2019-01-08 ENCOUNTER — Telehealth: Payer: Self-pay

## 2019-01-08 NOTE — Telephone Encounter (Signed)
Great - thanks

## 2019-01-08 NOTE — Telephone Encounter (Signed)
  [facility]   MULTIDISCIPLINARY HEART VALVE TEAM  The pt called the office to inquire about her TAVR Consult that is scheduled for 01/15/2019 with Dr Excell Seltzer.  The pt would like to know if she will still be able to come and bring her family. I advised her that at this time due to risk of exposure to Covid-19 we are advising that pt's who are stable should reschedule their appointment.  Also there is currently a restriction in place that only one person can attend an office visit with a patient. The pt is feeling well at this time and denies any symptoms.  The pt is in agreement that from a safety standpoint she would also like the apt with Dr Excell Seltzer rescheduled to a later date.  I have rescheduled this apt to 02/10/2019.  I advised the pt that at any time if he developed symptoms of new or worsening SOB, CP (tightness), swelling, dizziness and syncope that she should call [facility] at [phone] for advisement or call 911.  Pt agreed with plan.

## 2019-01-15 ENCOUNTER — Institutional Professional Consult (permissible substitution): Payer: Medicare Other | Admitting: Cardiovascular Disease

## 2019-01-20 ENCOUNTER — Other Ambulatory Visit: Payer: Self-pay | Admitting: Cardiovascular Disease

## 2019-02-10 ENCOUNTER — Institutional Professional Consult (permissible substitution): Payer: Medicare Other | Admitting: Cardiovascular Disease

## 2019-03-13 ENCOUNTER — Telehealth: Payer: Self-pay

## 2019-03-13 NOTE — Telephone Encounter (Signed)
    COVID-19 Pre-Screening Questions:  . In the past 7 to 10 days have you had a cough,  shortness of breath, headache, congestion, fever (100 or greater) body aches, chills, sore throat, or sudden loss of taste or sense of smell? . Have you been around anyone with known Covid 19. . Have you been around anyone who is awaiting Covid 19 test results in the past 7 to 10 days? . Have you been around anyone who has been exposed to Covid 19, or has mentioned symptoms of Covid 19 within the past 7 to 10 days?  If you have any concerns/questions about symptoms patients report during screening (either on the phone or at threshold). Contact the provider seeing the patient or DOD for further guidance.  If neither are available contact a member of the leadership team.  I spoke with the pt and she answered NO to all 4 questions. The pt will be seen in the office on 5/22 for TAVR consult with Dr Excell Seltzer.  The pt has a number of family members that also need to hear this conversation: Daughter: [name] [phone] Son: [name] [phone] Kris Mouton daughter: [name] [phone]

## 2019-03-14 ENCOUNTER — Other Ambulatory Visit: Payer: Self-pay

## 2019-03-14 ENCOUNTER — Ambulatory Visit (INDEPENDENT_AMBULATORY_CARE_PROVIDER_SITE_OTHER): Payer: Medicare Other | Admitting: Cardiovascular Disease

## 2019-03-14 ENCOUNTER — Encounter: Payer: Self-pay | Admitting: Cardiovascular Disease

## 2019-03-14 VITALS — BP 142/64 | HR 70 | Ht 64.0 in | Wt 198.8 lb

## 2019-03-14 DIAGNOSIS — I5032 Chronic diastolic (congestive) heart failure: Secondary | ICD-10-CM | POA: Diagnosis not present

## 2019-03-14 DIAGNOSIS — I350 Nonrheumatic aortic (valve) stenosis: Secondary | ICD-10-CM | POA: Diagnosis not present

## 2019-03-14 NOTE — Progress Notes (Signed)
Cardiology Office Note:    Date:  03/14/2019   ID:  [name], DOB 07/03/1931, MRN [id number]  PCP:  Elkins, Wilson Oliver, MD  Cardiologist:  Heavan Francom, MD  Electrophysiologist:  None   Referring MD: Elkins, Wilson Oliver, *   Chief Complaint  Patient presents with  . Shortness of Breath    History of Present Illness:    [name] is a 85 y.o. female with a hx of aortic stenosis and paroxysmal atrial fibrillation, presenting for follow-up evaluation today.  She has been anticoagulated long-term with apixaban.  She has had moderate aortic stenosis followed for many years, now with progression of her aortic stenosis by her most recent echo study.  This was all reviewed with her at the time of her most recent office visit on December 19, 2018.  At that time she elected to continue with medical therapy and outpatient monitoring as she wanted to talk things over more with her family members.  She returns today for follow-up discussion.  The patient is here alone today in light of the COVID-19 restrictions.  I conferenced and her daughter [name] and her son [name] during our visit.  I also called her granddaughter, [name], after the visit.  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  The patient continues to experience symptoms of exertional dyspnea with any moderate level activity or when she gets in a hurry walking.  She does not have shortness of breath with activities of daily living.  She denies orthopnea or PND.  She denies heart palpitations or chest pain.  She has dizziness when she looks up but otherwise denies exertional lightheadedness or syncope.  She states that her symptoms have not changed much since I saw her last in February of this year.  Past Medical History:  Diagnosis Date  . Aortic stenosis    last echo 12/2009 - AVA is .9cm - EF 55-60%  . Dizziness    when standing too quickly - no syncope  . Exogenous obesity   . Family history of headaches   .  Heart murmur   . History of echocardiogram    Echo 5/17: Mild concentric LVH, EF 55-60%, normal wall motion, grade 3 diastolic dysfunction, moderate aortic stenosis (mean gradient 27 mmHg), moderate AI, mild MR, severe LAE, normal RV function, mild RAE, mild to moderate TR, PASP 42 mmHg, trivial pericardial effusion  . Hyperlipidemia   . Hypertension   . Hypothyroidism   . LVH (left ventricular hypertrophy)    with diastolic dysfunction  . Type II diabetes mellitus (HCC)     Past Surgical History:  Procedure Laterality Date  . LAPAROSCOPIC CHOLECYSTECTOMY  2008    Current Medications: Current Meds  Medication Sig  . acetaminophen (TYLENOL) 500 MG tablet Take 1,000 mg by mouth every 6 (six) hours as needed for mild pain, moderate pain or headache.  . amLODipine (NORVASC) 10 MG tablet Take 1 tablet (10 mg total) by mouth daily.  . apixaban (ELIQUIS) 5 MG TABS tablet Take 1 tablet (5 mg total) by mouth 2 (two) times daily.  . hydrochlorothiazide (HYDRODIURIL) 12.5 MG tablet Take 1 tablet (12.5 mg total) by mouth daily.  . levothyroxine (SYNTHROID, LEVOTHROID) 75 MCG tablet Take 75 mcg by mouth daily.  . losartan (COZAAR) 50 MG tablet Take 1 tablet (50 mg total) by mouth daily.  . metFORMIN (GLUCOPHAGE) 500 MG tablet Take 250 mg by mouth 2 (two) times daily with a meal.   . metoprolol   tartrate (LOPRESSOR) 25 MG tablet Take 1 tablet (25 mg total) by mouth 2 (two) times daily.  . rosuvastatin (CRESTOR) 10 MG tablet TAKE 1 TABLET BY MOUTH EVERY OTHER DAY  . spironolactone (ALDACTONE) 25 MG tablet Take 0.5 tablets (12.5 mg total) by mouth daily.     Allergies:   Lisinopril   Social History   Socioeconomic History  . Marital status: Widowed    Spouse name: Not on file  . Number of children: Not on file  . Years of education: Not on file  . Highest education level: Not on file  Occupational History  . Not on file  Social Needs  . Financial resource strain: Not on file  . Food  insecurity:    Worry: Not on file    Inability: Not on file  . Transportation needs:    Medical: Not on file    Non-medical: Not on file  Tobacco Use  . Smoking status: Never Smoker  . Smokeless tobacco: Never Used  Substance and Sexual Activity  . Alcohol use: No  . Drug use: No  . Sexual activity: Not on file  Lifestyle  . Physical activity:    Days per week: Not on file    Minutes per session: Not on file  . Stress: Not on file  Relationships  . Social connections:    Talks on phone: Not on file    Gets together: Not on file    Attends religious service: Not on file    Active member of club or organization: Not on file    Attends meetings of clubs or organizations: Not on file    Relationship status: Not on file  Other Topics Concern  . Not on file  Social History Narrative   6 grandkids   4 great grandkids   2 great great grandkids     Family History: The patient's family history includes Diabetes in her sister, sister, and sister; Heart attack in her father and sister; Hypertension in her sister; Kidney failure in her sister.  ROS:   Please see the history of present illness.    All other systems reviewed and are negative.  EKGs/Labs/Other Studies Reviewed:    The following studies were reviewed today: Echo 12/16/2018: IMPRESSIONS    1. The left ventricle has normal systolic function with an ejection fraction of 60-65%. The cavity size was normal. Left ventricular diastolic Doppler parameters are indeterminate.  2. The right ventricle has normal systolic function. The cavity was normal. There is no increase in right ventricular wall thickness. Right ventricular systolic pressure is mildly elevated with an estimated pressure of 39.4 mmHg.  3. Left atrial size was severely dilated.  4. Trivial pericardial effusion is present.  5. The mitral valve is normal in structure.  6. The tricuspid valve is normal in structure. Tricuspid valve regurgitation is moderate.   7. The aortic valve is tricuspid Severely thickening of the aortic valve Aortic valve regurgitation is trivial by color flow Doppler. severe stenosis of the aortic valve.  8. The pulmonic valve was normal in structure.  9. The aortic root is normal in size and structure. 10. Normal LV function; severe AS (peak velocity 4.1 m/s and mean gradient 40 mmHg); trace AI; mild MR; severe LAE; moderate TR; mild pulmonary hypertension.   EKG:  EKG is not ordered today.  Recent Labs: 12/16/2018: ALT 11; BUN 21; Creatinine, Ser 1.45; Hemoglobin 12.4; Platelets 261; Potassium 4.2; Sodium 139  Recent Lipid Panel  Component Value Date/Time   CHOL 141 08/25/2015 0935   TRIG 113 08/25/2015 0935   HDL 52 08/25/2015 0935   CHOLHDL 2.7 08/25/2015 0935   VLDL 23 08/25/2015 0935   LDLCALC 66 08/25/2015 0935    Physical Exam:    VS:  BP (!) 142/64   Pulse 70   Ht 5\' 4"  (1.626 m)   Wt 198 lb 12.8 oz (90.2 kg)   BMI 34.12 kg/m     Wt Readings from Last 3 Encounters:  03/14/19 198 lb 12.8 oz (90.2 kg)  12/19/18 194 lb (88 kg)  06/17/18 201 lb 12.8 oz (91.5 kg)     GEN: Well nourished, well developed in no acute distress HEENT: Normal NECK: No JVD; bilateral carotid bruits LYMPHATICS: No lymphadenopathy CARDIAC: RRR, 3/6 harsh late peaking systolic murmur at the right upper sternal border radiating to the neck RESPIRATORY:  Clear to auscultation without rales, wheezing or rhonchi  ABDOMEN: Soft, non-tender, non-distended MUSCULOSKELETAL:  No edema; No deformity  SKIN: Warm and dry NEUROLOGIC:  Alert and oriented x 3 PSYCHIATRIC:  Normal affect   ASSESSMENT:    1. Nonrheumatic aortic valve stenosis   2. Chronic diastolic heart failure (HCC)    PLAN:    The patient has severe, stage D1 aortic stenosis.  She is limited by New York Heart Association functional class II symptoms of chronic diastolic heart failure.  Echocardiogram from February of this year demonstrated calcification and  restriction of all 3 aortic valve leaflets with a mean transvalvular gradient of 40 mmHg and dimensionless index of 0.14.  LV function is preserved with an LVEF of 65%.  Today we again reviewed the natural history of severe symptomatic aortic stenosis as well as potential treatment options.  She understands that at her age, we will likely favor transcatheter aortic valve replacement pending the results of her preoperative testing.  She will require right and left heart catheterization for assessment of obstructive coronary artery disease as well as hemodynamics.  She will require CTA studies of the heart as well as the chest, abdomen, and pelvis to further assess anatomic suitability for TAVR.  She understands and after her studies are completed she will undergo formal cardiac surgical consultation as part of a multidisciplinary approach to her care.  I have reviewed the risks, indications, and alternatives to cardiac catheterization, possible angioplasty, and stenting with the patient. Risks include but are not limited to bleeding, infection, vascular injury, stroke, myocardial infection, arrhythmia, kidney injury, radiation-related injury in the case of prolonged fluoroscopy use, emergency cardiac surgery, and death. The patient understands the risks of serious complication is 1-2 in 1000 with diagnostic cardiac cath and 1-2% or less with angioplasty/stenting.   Medication Adjustments/Labs and Tests Ordered: Current medicines are reviewed at length with the patient today.  Concerns regarding medicines are outlined above.  Orders Placed This Encounter  Procedures  . CT ANGIO ABDOMEN PELVIS  W &/OR WO CONTRAST  . CT CORONARY MORPH W/CTA COR W/SCORE W/CA W/CM &/OR WO/CM  . CT ANGIO CHEST AORTA W &/OR WO CONTRAST  . Basic metabolic panel  . CBC with Differential/Platelet   No orders of the defined types were placed in this encounter.   There are no Patient Instructions on file for this visit.    Signed, Tonny Bollman, MD  03/14/2019 1:01 PM    [facility]

## 2019-03-14 NOTE — H&P (View-Only) (Signed)
Cardiology Office Note:    Date:  03/14/2019   ID:  [name], DOB 1931/04/07, MRN [id number]  PCP:  Jane Mask, MD  Cardiologist:  Jane Bollman, MD  Electrophysiologist:  None   Referring MD: [name], *   Chief Complaint  Patient presents with  . Shortness of Breath    History of Present Illness:    [name] is a 85 y.o. female with a hx of aortic stenosis and paroxysmal atrial fibrillation, presenting for follow-up evaluation today.  She has been anticoagulated long-term with apixaban.  She has had moderate aortic stenosis followed for many years, now with progression of her aortic stenosis by her most recent echo study.  This was all reviewed with her at the time of her most recent office visit on December 19, 2018.  At that time she elected to continue with medical therapy and outpatient monitoring as she wanted to talk things over more with her family members.  She returns today for follow-up discussion.  The patient is here alone today in light of the COVID-19 restrictions.  I conferenced and her daughter [name] and her son [name] during our visit.  I also called her granddaughter, [name], after the visit.  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  The patient continues to experience symptoms of exertional dyspnea with any moderate level activity or when she gets in a hurry walking.  She does not have shortness of breath with activities of daily living.  She denies orthopnea or PND.  She denies heart palpitations or chest pain.  She has dizziness when she looks up but otherwise denies exertional lightheadedness or syncope.  She states that her symptoms have not changed much since I saw her last in February of this year.  Past Medical History:  Diagnosis Date  . Aortic stenosis    last echo 12/2009 - AVA is .9cm - EF 55-60%  . Dizziness    when standing too quickly - no syncope  . Exogenous obesity   . Family history of headaches   .  Heart murmur   . History of echocardiogram    Echo 5/17: Mild concentric LVH, EF 55-60%, normal wall motion, grade 3 diastolic dysfunction, moderate aortic stenosis (mean gradient 27 mmHg), moderate AI, mild MR, severe LAE, normal RV function, mild RAE, mild to moderate TR, PASP 42 mmHg, trivial pericardial effusion  . Hyperlipidemia   . Hypertension   . Hypothyroidism   . LVH (left ventricular hypertrophy)    with diastolic dysfunction  . Type II diabetes mellitus (HCC)     Past Surgical History:  Procedure Laterality Date  . LAPAROSCOPIC CHOLECYSTECTOMY  2008    Current Medications: Current Meds  Medication Sig  . acetaminophen (TYLENOL) 500 MG tablet Take 1,000 mg by mouth every 6 (six) hours as needed for mild pain, moderate pain or headache.  Marland Kitchen amLODipine (NORVASC) 10 MG tablet Take 1 tablet (10 mg total) by mouth daily.  Marland Kitchen apixaban (ELIQUIS) 5 MG TABS tablet Take 1 tablet (5 mg total) by mouth 2 (two) times daily.  . hydrochlorothiazide (HYDRODIURIL) 12.5 MG tablet Take 1 tablet (12.5 mg total) by mouth daily.  Marland Kitchen levothyroxine (SYNTHROID, LEVOTHROID) 75 MCG tablet Take 75 mcg by mouth daily.  Marland Kitchen losartan (COZAAR) 50 MG tablet Take 1 tablet (50 mg total) by mouth daily.  . metFORMIN (GLUCOPHAGE) 500 MG tablet Take 250 mg by mouth 2 (two) times daily with a meal.   . metoprolol  tartrate (LOPRESSOR) 25 MG tablet Take 1 tablet (25 mg total) by mouth 2 (two) times daily.  . rosuvastatin (CRESTOR) 10 MG tablet TAKE 1 TABLET BY MOUTH EVERY OTHER DAY  . spironolactone (ALDACTONE) 25 MG tablet Take 0.5 tablets (12.5 mg total) by mouth daily.     Allergies:   Lisinopril   Social History   Socioeconomic History  . Marital status: Widowed    Spouse name: Not on file  . Number of children: Not on file  . Years of education: Not on file  . Highest education level: Not on file  Occupational History  . Not on file  Social Needs  . Financial resource strain: Not on file  . Food  insecurity:    Worry: Not on file    Inability: Not on file  . Transportation needs:    Medical: Not on file    Non-medical: Not on file  Tobacco Use  . Smoking status: Never Smoker  . Smokeless tobacco: Never Used  Substance and Sexual Activity  . Alcohol use: No  . Drug use: No  . Sexual activity: Not on file  Lifestyle  . Physical activity:    Days per week: Not on file    Minutes per session: Not on file  . Stress: Not on file  Relationships  . Social connections:    Talks on phone: Not on file    Gets together: Not on file    Attends religious service: Not on file    Active member of club or organization: Not on file    Attends meetings of clubs or organizations: Not on file    Relationship status: Not on file  Other Topics Concern  . Not on file  Social History Narrative   6 grandkids   4 great grandkids   2 great great grandkids     Family History: The patient's family history includes Diabetes in her sister, sister, and sister; Heart attack in her father and sister; Hypertension in her sister; Kidney failure in her sister.  ROS:   Please see the history of present illness.    All other systems reviewed and are negative.  EKGs/Labs/Other Studies Reviewed:    The following studies were reviewed today: Echo 12/16/2018: IMPRESSIONS    1. The left ventricle has normal systolic function with an ejection fraction of 60-65%. The cavity size was normal. Left ventricular diastolic Doppler parameters are indeterminate.  2. The right ventricle has normal systolic function. The cavity was normal. There is no increase in right ventricular wall thickness. Right ventricular systolic pressure is mildly elevated with an estimated pressure of 39.4 mmHg.  3. Left atrial size was severely dilated.  4. Trivial pericardial effusion is present.  5. The mitral valve is normal in structure.  6. The tricuspid valve is normal in structure. Tricuspid valve regurgitation is moderate.   7. The aortic valve is tricuspid Severely thickening of the aortic valve Aortic valve regurgitation is trivial by color flow Doppler. severe stenosis of the aortic valve.  8. The pulmonic valve was normal in structure.  9. The aortic root is normal in size and structure. 10. Normal LV function; severe AS (peak velocity 4.1 m/s and mean gradient 40 mmHg); trace AI; mild MR; severe LAE; moderate TR; mild pulmonary hypertension.   EKG:  EKG is not ordered today.  Recent Labs: 12/16/2018: ALT 11; BUN 21; Creatinine, Ser 1.45; Hemoglobin 12.4; Platelets 261; Potassium 4.2; Sodium 139  Recent Lipid Panel  Component Value Date/Time   CHOL 141 08/25/2015 0935   TRIG 113 08/25/2015 0935   HDL 52 08/25/2015 0935   CHOLHDL 2.7 08/25/2015 0935   VLDL 23 08/25/2015 0935   LDLCALC 66 08/25/2015 0935    Physical Exam:    VS:  BP (!) 142/64   Pulse 70   Ht 5\' 4"  (1.626 m)   Wt 198 lb 12.8 oz (90.2 kg)   BMI 34.12 kg/m     Wt Readings from Last 3 Encounters:  03/14/19 198 lb 12.8 oz (90.2 kg)  12/19/18 194 lb (88 kg)  06/17/18 201 lb 12.8 oz (91.5 kg)     GEN: Well nourished, well developed in no acute distress HEENT: Normal NECK: No JVD; bilateral carotid bruits LYMPHATICS: No lymphadenopathy CARDIAC: RRR, 3/6 harsh late peaking systolic murmur at the right upper sternal border radiating to the neck RESPIRATORY:  Clear to auscultation without rales, wheezing or rhonchi  ABDOMEN: Soft, non-tender, non-distended MUSCULOSKELETAL:  No edema; No deformity  SKIN: Warm and dry NEUROLOGIC:  Alert and oriented x 3 PSYCHIATRIC:  Normal affect   ASSESSMENT:    1. Nonrheumatic aortic valve stenosis   2. Chronic diastolic heart failure (HCC)    PLAN:    The patient has severe, stage D1 aortic stenosis.  She is limited by New York Heart Association functional class II symptoms of chronic diastolic heart failure.  Echocardiogram from February of this year demonstrated calcification and  restriction of all 3 aortic valve leaflets with a mean transvalvular gradient of 40 mmHg and dimensionless index of 0.14.  LV function is preserved with an LVEF of 65%.  Today we again reviewed the natural history of severe symptomatic aortic stenosis as well as potential treatment options.  She understands that at her age, we will likely favor transcatheter aortic valve replacement pending the results of her preoperative testing.  She will require right and left heart catheterization for assessment of obstructive coronary artery disease as well as hemodynamics.  She will require CTA studies of the heart as well as the chest, abdomen, and pelvis to further assess anatomic suitability for TAVR.  She understands and after her studies are completed she will undergo formal cardiac surgical consultation as part of a multidisciplinary approach to her care.  I have reviewed the risks, indications, and alternatives to cardiac catheterization, possible angioplasty, and stenting with the patient. Risks include but are not limited to bleeding, infection, vascular injury, stroke, myocardial infection, arrhythmia, kidney injury, radiation-related injury in the case of prolonged fluoroscopy use, emergency cardiac surgery, and death. The patient understands the risks of serious complication is 1-2 in 1000 with diagnostic cardiac cath and 1-2% or less with angioplasty/stenting.   Medication Adjustments/Labs and Tests Ordered: Current medicines are reviewed at length with the patient today.  Concerns regarding medicines are outlined above.  Orders Placed This Encounter  Procedures  . CT ANGIO ABDOMEN PELVIS  W &/OR WO CONTRAST  . CT CORONARY MORPH W/CTA COR W/SCORE W/CA W/CM &/OR WO/CM  . CT ANGIO CHEST AORTA W &/OR WO CONTRAST  . Basic metabolic panel  . CBC with Differential/Platelet   No orders of the defined types were placed in this encounter.   There are no Patient Instructions on file for this visit.    Signed, Jane Bollman, MD  03/14/2019 1:01 PM    [facility]

## 2019-03-14 NOTE — Patient Instructions (Signed)
Medication Instructions:  Your provider recommends that you continue on your current medications as directed. Please refer to the Current Medication list given to you today.    Labwork: TODAY!  Testing/Procedures: Your physician has requested that you have a cardiac catheterization. Cardiac catheterization is used to diagnose and/or treat various heart conditions. Doctors may recommend this procedure for a number of different reasons. The most common reason is to evaluate chest pain. Chest pain can be a symptom of coronary artery disease (CAD), and cardiac catheterization can show whether plaque is narrowing or blocking your heart's arteries. This procedure is also used to evaluate the valves, as well as measure the blood flow and oxygen levels in different parts of your heart. For further information please visit [email or web address] Please follow instruction sheet, as given.  Follow-Up: We will contact you after your catheterization to arrange further follow-up.    CATHETERIZATION INSTRUCTIONS: You are scheduled for a Cardiac Catheterization on Monday, June 1 with Dr. Tonny Bollman.  1. Please arrive at the [facility] (Main Entrance A) at [facility]: [address] [place], [place] [address] at 5:30 AM (This time is two hours before your procedure to ensure your preparation). Free valet parking service is available.   Special note: Every effort is made to have your procedure done on time. Please understand that emergencies sometimes delay scheduled procedures.  2. Diet: Do not eat solid foods after midnight.  The patient may have clear liquids until 5am upon the day of the procedure.   3. Medication instructions in preparation for your procedure:  1) Take your last dose of Eliquis Friday night, 5/29. You will be told when to resume.  2) HOLD METFORMIN the morning of your procedure and 48 hours after.   3) HOLD LOSARTAN the day before and day of your catheterization.  4) HOLD  HYDRODIURIL the morning of your catheterization.   5) TAKE ASPIRIN 81 mg the morning of your catheterization.  6) you may take other medications as directed with sips of water.  4. Plan for one night stay--bring personal belongings. 5. Bring a current list of your medications and current insurance cards. 6. You MUST have a responsible person to drive you home. 7. Someone MUST be with you the first 24 hours after you arrive home or your discharge will be delayed. 8. Please wear clothes that are easy to get on and off and wear slip-on shoes.  Thank you for allowing [place] to care for you!   -- [facility]

## 2019-03-15 LAB — CBC WITH DIFFERENTIAL/PLATELET
Basophils Absolute: 0.1 10*3/uL (ref 0.0–0.2)
Basos: 1 %
EOS (ABSOLUTE): 0.2 10*3/uL (ref 0.0–0.4)
Eos: 4 %
Hematocrit: 38.5 % (ref 34.0–46.6)
Hemoglobin: 13.2 g/dL (ref 11.1–15.9)
Immature Grans (Abs): 0 10*3/uL (ref 0.0–0.1)
Immature Granulocytes: 0 %
Lymphocytes Absolute: 2.1 10*3/uL (ref 0.7–3.1)
Lymphs: 31 %
MCH: 30.1 pg (ref 26.6–33.0)
MCHC: 34.3 g/dL (ref 31.5–35.7)
MCV: 88 fL (ref 79–97)
Monocytes Absolute: 0.7 10*3/uL (ref 0.1–0.9)
Monocytes: 10 %
Neutrophils Absolute: 3.6 10*3/uL (ref 1.4–7.0)
Neutrophils: 54 %
Platelets: 250 10*3/uL (ref 150–450)
RBC: 4.39 x10E6/uL (ref 3.77–5.28)
RDW: 13.6 % (ref 11.7–15.4)
WBC: 6.7 10*3/uL (ref 3.4–10.8)

## 2019-03-15 LAB — BASIC METABOLIC PANEL
BUN/Creatinine Ratio: 18 (ref 12–28)
BUN: 24 mg/dL (ref 8–27)
CO2: 24 mmol/L (ref 20–29)
Calcium: 10.6 mg/dL — ABNORMAL HIGH (ref 8.7–10.3)
Chloride: 98 mmol/L (ref 96–106)
Creatinine, Ser: 1.35 mg/dL — ABNORMAL HIGH (ref 0.57–1.00)
GFR calc Af Amer: 41 mL/min/{1.73_m2} — ABNORMAL LOW (ref >59)
GFR calc non Af Amer: 36 mL/min/{1.73_m2} — ABNORMAL LOW (ref >59)
Glucose: 124 mg/dL — ABNORMAL HIGH (ref 65–99)
Potassium: 4.8 mmol/L (ref 3.5–5.2)
Sodium: 138 mmol/L (ref 134–144)

## 2019-03-21 ENCOUNTER — Other Ambulatory Visit: Payer: Self-pay | Admitting: Physician Assistant

## 2019-03-21 ENCOUNTER — Other Ambulatory Visit (HOSPITAL_COMMUNITY)
Admission: RE | Admit: 2019-03-21 | Discharge: 2019-03-21 | Disposition: A | Discharge status: Home / Self Care | Payer: Medicare Other | Source: Ambulatory Visit | Attending: Cardiovascular Disease | Admitting: Cardiovascular Disease

## 2019-03-21 ENCOUNTER — Institutional Professional Consult (permissible substitution): Payer: Medicare Other | Admitting: Cardiovascular Disease

## 2019-03-21 DIAGNOSIS — Z952 Presence of prosthetic heart valve: Secondary | ICD-10-CM

## 2019-03-21 DIAGNOSIS — Z1159 Encounter for screening for other viral diseases: Secondary | ICD-10-CM | POA: Insufficient documentation

## 2019-03-22 LAB — NOVEL CORONAVIRUS, NAA (HOSP ORDER, SEND-OUT TO REF LAB; TAT 18-24 HRS): SARS-CoV-2, NAA: NOT DETECTED

## 2019-03-24 ENCOUNTER — Encounter (HOSPITAL_COMMUNITY)
Admission: RE | Disposition: A | Discharge status: Home / Self Care | Payer: Self-pay | Source: Home / Self Care | Attending: Cardiovascular Disease

## 2019-03-24 ENCOUNTER — Ambulatory Visit (HOSPITAL_COMMUNITY)
Admission: RE | Admit: 2019-03-24 | Discharge: 2019-03-24 | Disposition: A | Discharge status: Home / Self Care | Payer: Medicare Other | Attending: Cardiovascular Disease | Admitting: Cardiovascular Disease

## 2019-03-24 ENCOUNTER — Other Ambulatory Visit: Payer: Self-pay | Admitting: Physician Assistant

## 2019-03-24 ENCOUNTER — Other Ambulatory Visit: Payer: Self-pay

## 2019-03-24 ENCOUNTER — Encounter: Payer: Self-pay | Admitting: Physician Assistant

## 2019-03-24 DIAGNOSIS — I352 Nonrheumatic aortic (valve) stenosis with insufficiency: Secondary | ICD-10-CM | POA: Diagnosis not present

## 2019-03-24 DIAGNOSIS — E039 Hypothyroidism, unspecified: Secondary | ICD-10-CM | POA: Insufficient documentation

## 2019-03-24 DIAGNOSIS — I110 Hypertensive heart disease with heart failure: Secondary | ICD-10-CM | POA: Diagnosis not present

## 2019-03-24 DIAGNOSIS — I350 Nonrheumatic aortic (valve) stenosis: Secondary | ICD-10-CM | POA: Diagnosis present

## 2019-03-24 DIAGNOSIS — E785 Hyperlipidemia, unspecified: Secondary | ICD-10-CM | POA: Diagnosis not present

## 2019-03-24 DIAGNOSIS — Z7984 Long term (current) use of oral hypoglycemic drugs: Secondary | ICD-10-CM | POA: Diagnosis not present

## 2019-03-24 DIAGNOSIS — Z7901 Long term (current) use of anticoagulants: Secondary | ICD-10-CM | POA: Diagnosis not present

## 2019-03-24 DIAGNOSIS — Z79899 Other long term (current) drug therapy: Secondary | ICD-10-CM | POA: Insufficient documentation

## 2019-03-24 DIAGNOSIS — Z8249 Family history of ischemic heart disease and other diseases of the circulatory system: Secondary | ICD-10-CM | POA: Insufficient documentation

## 2019-03-24 DIAGNOSIS — E119 Type 2 diabetes mellitus without complications: Secondary | ICD-10-CM | POA: Diagnosis not present

## 2019-03-24 DIAGNOSIS — I5032 Chronic diastolic (congestive) heart failure: Secondary | ICD-10-CM | POA: Diagnosis not present

## 2019-03-24 DIAGNOSIS — Z888 Allergy status to other drugs, medicaments and biological substances status: Secondary | ICD-10-CM | POA: Diagnosis not present

## 2019-03-24 DIAGNOSIS — I2510 Atherosclerotic heart disease of native coronary artery without angina pectoris: Secondary | ICD-10-CM | POA: Insufficient documentation

## 2019-03-24 HISTORY — PX: RIGHT/LEFT HEART CATH AND CORONARY ANGIOGRAPHY: CATH118266

## 2019-03-24 LAB — POCT I-STAT EG7
Acid-base deficit: 1 mmol/L (ref 0.0–2.0)
Acid-base deficit: 4 mmol/L — ABNORMAL HIGH (ref 0.0–2.0)
Bicarbonate: 21.9 mmol/L (ref 20.0–28.0)
Bicarbonate: 24.4 mmol/L (ref 20.0–28.0)
Calcium, Ion: 1.22 mmol/L (ref 1.15–1.40)
Calcium, Ion: 1.27 mmol/L (ref 1.15–1.40)
HCT: 37 % (ref 36.0–46.0)
HCT: 39 % (ref 36.0–46.0)
Hemoglobin: 12.6 g/dL (ref 12.0–15.0)
Hemoglobin: 13.3 g/dL (ref 12.0–15.0)
O2 Saturation: 81 %
O2 Saturation: 82 %
Potassium: 4.2 mmol/L (ref 3.5–5.1)
Potassium: 4.3 mmol/L (ref 3.5–5.1)
Sodium: 139 mmol/L (ref 135–145)
Sodium: 139 mmol/L (ref 135–145)
TCO2: 23 mmol/L (ref 22–32)
TCO2: 26 mmol/L (ref 22–32)
pCO2, Ven: 40.3 mmHg — ABNORMAL LOW (ref 44.0–60.0)
pCO2, Ven: 43.9 mmHg — ABNORMAL LOW (ref 44.0–60.0)
pH, Ven: 7.343 (ref 7.250–7.430)
pH, Ven: 7.353 (ref 7.250–7.430)
pO2, Ven: 48 mmHg — ABNORMAL HIGH (ref 32.0–45.0)
pO2, Ven: 49 mmHg — ABNORMAL HIGH (ref 32.0–45.0)

## 2019-03-24 LAB — POCT I-STAT 7, (LYTES, BLD GAS, ICA,H+H)
Acid-base deficit: 6 mmol/L — ABNORMAL HIGH (ref 0.0–2.0)
Bicarbonate: 20.3 mmol/L (ref 20.0–28.0)
Calcium, Ion: 1.2 mmol/L (ref 1.15–1.40)
HCT: 36 % (ref 36.0–46.0)
Hemoglobin: 12.2 g/dL (ref 12.0–15.0)
O2 Saturation: 98 %
Potassium: 3.9 mmol/L (ref 3.5–5.1)
Sodium: 127 mmol/L — ABNORMAL LOW (ref 135–145)
TCO2: 22 mmol/L (ref 22–32)
pCO2 arterial: 41.8 mmHg (ref 32.0–48.0)
pH, Arterial: 7.293 — ABNORMAL LOW (ref 7.350–7.450)
pO2, Arterial: 125 mmHg — ABNORMAL HIGH (ref 83.0–108.0)

## 2019-03-24 SURGERY — RIGHT/LEFT HEART CATH AND CORONARY ANGIOGRAPHY
Anesthesia: LOCAL

## 2019-03-24 MED ORDER — HEPARIN SODIUM (PORCINE) 1000 UNIT/ML IJ SOLN
INTRAMUSCULAR | Status: DC | PRN
  Administered 2019-03-24: 5000 [IU] via INTRAVENOUS

## 2019-03-24 MED ORDER — SODIUM CHLORIDE 0.9% FLUSH: 3.0000 mL | INTRAVENOUS | Status: DC | PRN

## 2019-03-24 MED ORDER — HYDRALAZINE HCL 20 MG/ML IJ SOLN: 10.0000 mg | INTRAMUSCULAR | Status: DC | PRN

## 2019-03-24 MED ORDER — HEPARIN (PORCINE) IN NACL 1000-0.9 UT/500ML-% IV SOLN
INTRAVENOUS | Status: DC | PRN
  Administered 2019-03-24: 500 mL

## 2019-03-24 MED ORDER — IOHEXOL 350 MG/ML SOLN
INTRAVENOUS | Status: DC | PRN
  Administered 2019-03-24: 12:00:00 55 mL via INTRA_ARTERIAL

## 2019-03-24 MED ORDER — SODIUM CHLORIDE 0.9 % IV SOLN: 250.0000 mL | INTRAVENOUS | Status: DC | PRN

## 2019-03-24 MED ORDER — SODIUM CHLORIDE 0.9 % WEIGHT BASED INFUSION
3.0000 mL/kg/h | INTRAVENOUS | Status: AC
  Administered 2019-03-24: 06:00:00 3 mL/kg/h via INTRAVENOUS

## 2019-03-24 MED ORDER — SODIUM CHLORIDE 0.9 % WEIGHT BASED INFUSION: 1.0000 mL/kg/h | INTRAVENOUS | Status: DC

## 2019-03-24 MED ORDER — FENTANYL CITRATE (PF) 100 MCG/2ML IJ SOLN
INTRAMUSCULAR | Status: DC | PRN
  Administered 2019-03-24: 25 ug via INTRAVENOUS

## 2019-03-24 MED ORDER — SODIUM CHLORIDE 0.9% FLUSH: 3.0000 mL | Freq: Two times a day (BID) | INTRAVENOUS | Status: DC

## 2019-03-24 MED ORDER — MIDAZOLAM HCL 2 MG/2ML IJ SOLN
INTRAMUSCULAR | Status: AC
  Filled 2019-03-24: qty 2

## 2019-03-24 MED ORDER — MIDAZOLAM HCL 2 MG/2ML IJ SOLN
INTRAMUSCULAR | Status: DC | PRN
  Administered 2019-03-24: 1 mg via INTRAVENOUS

## 2019-03-24 MED ORDER — VERAPAMIL HCL 2.5 MG/ML IV SOLN
INTRAVENOUS | Status: DC | PRN
  Administered 2019-03-24: 11:00:00 via INTRA_ARTERIAL

## 2019-03-24 MED ORDER — FENTANYL CITRATE (PF) 100 MCG/2ML IJ SOLN
INTRAMUSCULAR | Status: AC
  Filled 2019-03-24: qty 2

## 2019-03-24 MED ORDER — LIDOCAINE HCL (PF) 1 % IJ SOLN
INTRAMUSCULAR | Status: DC | PRN
  Administered 2019-03-24 (×2): 2 mL

## 2019-03-24 MED ORDER — LABETALOL HCL 5 MG/ML IV SOLN: 10.0000 mg | INTRAVENOUS | Status: DC | PRN

## 2019-03-24 MED ORDER — LIDOCAINE HCL (PF) 1 % IJ SOLN
INTRAMUSCULAR | Status: AC
  Filled 2019-03-24: qty 30

## 2019-03-24 MED ORDER — HEPARIN (PORCINE) IN NACL 1000-0.9 UT/500ML-% IV SOLN
INTRAVENOUS | Status: AC
  Filled 2019-03-24: qty 1000

## 2019-03-24 MED ORDER — ONDANSETRON HCL 4 MG/2ML IJ SOLN: 4.0000 mg | Freq: Four times a day (QID) | INTRAMUSCULAR | Status: DC | PRN

## 2019-03-24 MED ORDER — ACETAMINOPHEN 325 MG PO TABS: 650.0000 mg | ORAL_TABLET | ORAL | Status: DC | PRN

## 2019-03-24 MED ORDER — ASPIRIN 81 MG PO CHEW: 81.0000 mg | CHEWABLE_TABLET | ORAL | Status: DC

## 2019-03-24 MED ORDER — VERAPAMIL HCL 2.5 MG/ML IV SOLN
INTRAVENOUS | Status: AC
  Filled 2019-03-24: qty 2

## 2019-03-24 SURGICAL SUPPLY — 12 items

## 2019-03-24 NOTE — Progress Notes (Signed)
D/c instructions given to granddaughter, Colin Mulders, over telephone due to COVID19 restrictions.  All questions answered and Brianna verbalized understanding

## 2019-03-24 NOTE — Interval H&P Note (Signed)
History and Physical Interval Note:  03/24/2019 10:57 AM  [name]  has presented today for surgery, with the diagnosis of arotic stenosis.  The various methods of treatment have been discussed with the patient and family. After consideration of risks, benefits and other options for treatment, the patient has consented to  Procedure(s): RIGHT/LEFT HEART CATH AND CORONARY ANGIOGRAPHY (N/A) as a surgical intervention.  The patient's history has been reviewed, patient examined, no change in status, stable for surgery.  I have reviewed the patient's chart and labs.  Questions were answered to the patient's satisfaction.     Tonny Bollman

## 2019-03-24 NOTE — Progress Notes (Signed)
Site area: right brachial vein sheath  Site Prior to Removal:  Level 0 Pressure Applied For:10 minutes Manual:   yes Patient Status During Pull:  No problems Post Pull Site:  Level 0 Post Pull Instructions Given:  yes Post Pull Pulses Present: yes Dressing Applied:  Right brachial vein gauze / tegaderm and coban Bedrest begins @ na Comments:no problems with site

## 2019-03-24 NOTE — Discharge Instructions (Signed)
Radial Site Care ° °This sheet gives you information about how to care for yourself after your procedure. Your health care provider may also give you more specific instructions. If you have problems or questions, contact your health care provider. °What can I expect after the procedure? °After the procedure, it is common to have: °· Bruising and tenderness at the catheter insertion area. °Follow these instructions at home: °Medicines °· Take over-the-counter and prescription medicines only as told by your health care provider. °Insertion site care °· Follow instructions from your health care provider about how to take care of your insertion site. Make sure you: °? Wash your hands with soap and water before you change your bandage (dressing). If soap and water are not available, use hand sanitizer. °? Change your dressing as told by your health care provider. °? Leave stitches (sutures), skin glue, or adhesive strips in place. These skin closures may need to stay in place for 2 weeks or longer. If adhesive strip edges start to loosen and curl up, you may trim the loose edges. Do not remove adhesive strips completely unless your health care provider tells you to do that. °· Check your insertion site every day for signs of infection. Check for: °? Redness, swelling, or pain. °? Fluid or blood. °? Pus or a bad smell. °? Warmth. °· Do not take baths, swim, or use a hot tub until your health care provider approves. °· You may shower 24-48 hours after the procedure, or as directed by your health care provider. °? Remove the dressing and gently wash the site with plain soap and water. °? Pat the area dry with a clean towel. °? Do not rub the site. That could cause bleeding. °· Do not apply powder or lotion to the site. °Activity ° °· For 24 hours after the procedure, or as directed by your health care provider: °? Do not flex or bend the affected arm. °? Do not push or pull heavy objects with the affected arm. °? Do not  drive yourself home from the hospital or clinic. You may drive 24 hours after the procedure unless your health care provider tells you not to. °? Do not operate machinery or power tools. °· Do not lift anything that is heavier than 10 lb (4.5 kg), or the limit that you are told, until your health care provider says that it is safe. °· Ask your health care provider when it is okay to: °? Return to work or school. °? Resume usual physical activities or sports. °? Resume sexual activity. °General instructions °· If the catheter site starts to bleed, raise your arm and put firm pressure on the site. If the bleeding does not stop, get help right away. This is a medical emergency. °· If you went home on the same day as your procedure, a responsible adult should be with you for the first 24 hours after you arrive home. °· Keep all follow-up visits as told by your health care provider. This is important. °Contact a health care provider if: °· You have a fever. °· You have redness, swelling, or yellow drainage around your insertion site. °Get help right away if: °· You have unusual pain at the radial site. °· The catheter insertion area swells very fast. °· The insertion area is bleeding, and the bleeding does not stop when you hold steady pressure on the area. °· Your arm or hand becomes pale, cool, tingly, or numb. °These symptoms may represent a serious problem   that is an emergency. Do not wait to see if the symptoms will go away. Get medical help right away. Call your local emergency services (911 in the [place] Do not drive yourself to the hospital. °Summary °· After the procedure, it is common to have bruising and tenderness at the site. °· Follow instructions from your health care provider about how to take care of your radial site wound. Check the wound every day for signs of infection. °· Do not lift anything that is heavier than 10 lb (4.5 kg), or the limit that you are told, until your health care provider says  that it is safe. °This information is not intended to replace advice given to you by your health care provider. Make sure you discuss any questions you have with your health care provider. °Document Released: 11/11/2010 Document Revised: 11/14/2017 Document Reviewed: 11/14/2017 °Elsevier Interactive Patient Education © 2019 [employer] ° °

## 2019-03-25 ENCOUNTER — Encounter (HOSPITAL_COMMUNITY): Payer: Self-pay | Admitting: Cardiovascular Disease

## 2019-03-31 ENCOUNTER — Ambulatory Visit (HOSPITAL_COMMUNITY)
Admission: RE | Admit: 2019-03-31 | Discharge: 2019-03-31 | Disposition: A | Discharge status: Home / Self Care | Payer: Medicare Other | Source: Ambulatory Visit | Attending: Physician Assistant | Admitting: Physician Assistant

## 2019-03-31 ENCOUNTER — Ambulatory Visit (HOSPITAL_COMMUNITY): Payer: Medicare Other

## 2019-03-31 ENCOUNTER — Other Ambulatory Visit: Payer: Self-pay

## 2019-03-31 ENCOUNTER — Ambulatory Visit (HOSPITAL_BASED_OUTPATIENT_CLINIC_OR_DEPARTMENT_OTHER)
Admission: RE | Admit: 2019-03-31 | Discharge: 2019-03-31 | Disposition: A | Discharge status: Home / Self Care | Payer: Medicare Other | Source: Ambulatory Visit | Attending: Physician Assistant | Admitting: Physician Assistant

## 2019-03-31 DIAGNOSIS — Z952 Presence of prosthetic heart valve: Secondary | ICD-10-CM

## 2019-03-31 DIAGNOSIS — K5730 Diverticulosis of large intestine without perforation or abscess without bleeding: Secondary | ICD-10-CM | POA: Insufficient documentation

## 2019-03-31 DIAGNOSIS — I700 Atherosclerosis of aorta: Secondary | ICD-10-CM | POA: Insufficient documentation

## 2019-03-31 DIAGNOSIS — I350 Nonrheumatic aortic (valve) stenosis: Secondary | ICD-10-CM

## 2019-03-31 DIAGNOSIS — I2510 Atherosclerotic heart disease of native coronary artery without angina pectoris: Secondary | ICD-10-CM | POA: Diagnosis not present

## 2019-03-31 LAB — BASIC METABOLIC PANEL
Anion gap: 12 (ref 5–15)
BUN: 23 mg/dL (ref 8–23)
CO2: 23 mmol/L (ref 22–32)
Calcium: 10.1 mg/dL (ref 8.9–10.3)
Chloride: 101 mmol/L (ref 98–111)
Creatinine, Ser: 1.42 mg/dL — ABNORMAL HIGH (ref 0.44–1.00)
GFR calc Af Amer: 39 mL/min — ABNORMAL LOW (ref >60)
GFR calc non Af Amer: 34 mL/min — ABNORMAL LOW (ref >60)
Glucose, Bld: 154 mg/dL — ABNORMAL HIGH (ref 70–99)
Potassium: 4.6 mmol/L (ref 3.5–5.1)
Sodium: 136 mmol/L (ref 135–145)

## 2019-03-31 MED ORDER — SODIUM BICARBONATE 8.4 % IV SOLN
INTRAVENOUS | Status: DC
  Filled 2019-03-31: qty 500

## 2019-03-31 MED ORDER — IOHEXOL 350 MG/ML SOLN
100.0000 mL | Freq: Once | INTRAVENOUS | Status: AC | PRN
  Administered 2019-03-31: 100 mL via INTRAVENOUS

## 2019-03-31 MED ORDER — SODIUM BICARBONATE BOLUS VIA INFUSION
INTRAVENOUS | Status: AC
  Administered 2019-03-31: 75 meq via INTRAVENOUS
  Filled 2019-03-31: qty 1

## 2019-03-31 NOTE — Progress Notes (Signed)
Carotid artery duplex has been completed. Preliminary results can be found in CV Proc through chart review.   03/31/19 1:34 PM [name] RVT

## 2019-04-04 ENCOUNTER — Encounter: Payer: Self-pay | Admitting: Physician Assistant

## 2019-04-07 ENCOUNTER — Other Ambulatory Visit: Payer: Self-pay

## 2019-04-08 ENCOUNTER — Other Ambulatory Visit: Payer: Self-pay

## 2019-04-08 ENCOUNTER — Encounter: Payer: Self-pay | Admitting: Thoracic Surgery (Cardiothoracic Vascular Surgery)

## 2019-04-08 ENCOUNTER — Encounter: Payer: Self-pay | Admitting: Physical Therapy

## 2019-04-08 ENCOUNTER — Institutional Professional Consult (permissible substitution) (INDEPENDENT_AMBULATORY_CARE_PROVIDER_SITE_OTHER): Payer: Medicare Other | Admitting: Thoracic Surgery (Cardiothoracic Vascular Surgery)

## 2019-04-08 ENCOUNTER — Ambulatory Visit: Payer: Medicare Other | Attending: Family Medicine | Admitting: Physical Therapy

## 2019-04-08 VITALS — BP 167/70 | HR 62 | Temp 97.7°F | Resp 16 | Ht 64.0 in | Wt 198.0 lb

## 2019-04-08 DIAGNOSIS — R2689 Other abnormalities of gait and mobility: Secondary | ICD-10-CM | POA: Diagnosis present

## 2019-04-08 DIAGNOSIS — I350 Nonrheumatic aortic (valve) stenosis: Secondary | ICD-10-CM | POA: Diagnosis not present

## 2019-04-08 DIAGNOSIS — I4819 Other persistent atrial fibrillation: Secondary | ICD-10-CM

## 2019-04-08 NOTE — H&P (View-Only) (Signed)
[facility]  MULTIDISCIPLINARY HEART VALVE CLINIC  CARDIOTHORACIC SURGERY CONSULTATION REPORT  Referring Provider is Tonny Bollmanooper, Michael, MD PCP is Kaleen MaskElkins, Wilson Oliver, MD  Chief Complaint  Patient presents with   Aortic Stenosis    TAVR EVAL.Marland Kitchen.all required studies have been completed except fpr PFT    HPI:  Patient is an 85 year old widowed [name] with history of aortic stenosis, hypertension, PAF on long-term anticoagulation, type 2 diabetes mellitus, and hyperlipidemia who has been referred for surgical consultation to discuss treatment options for management of severe symptomatic aortic stenosis.  Patient has been relatively healthy, physically active, and functionally independent for all of her adult life.  She has been treated for essential hypertension and type 2 diabetes mellitus for which she is currently diet controlled.  She was noted to have a heart murmur on physical exam many years ago and was diagnosed with aortic stenosis.  For the last several years she has been followed by Dr. Excell Seltzerooper with serial echocardiograms.  Approximately 3 years ago she was noted to be in atrial fibrillation and started on long-term anticoagulation using Eliquis.  Most recent twelve-lead EKG revealed normal sinus rhythm.  Over the past year the patient has developed decreased exercise tolerance with worsening fatigue.  Follow-up transthoracic echocardiogram performed December 16, 2018 revealed significant progression and severity of aortic stenosis with peak velocity across aortic valve measured greater than 4.1 m/s corresponding to mean transvalvular gradient estimated 40 mmHg.  The DVI was notably quite low at 0.14 and aortic valve area calculated only 0.35 cm.  Left ventricular systolic function remain preserved.  Left and right heart catheterization was performed March 24, 2019 and confirmed the presence of severe aortic stenosis with peak to peak and mean transvalvular  gradients measured 46 and 31 mmHg, respectively.  There was mild nonobstructive coronary artery disease with 40 to 50% stenosis of the proximal right coronary artery, diffuse irregularity of the left anterior descending coronary artery and minor irregularities in the left circumflex system.  CT angiography was performed and the patient has been referred for surgical consultation.  Patient is widowed and lives [place] her adult son and daughter[place]in[place].  The patient continues to drive an automobile and remains functionally independent.  She enjoys gardening.  She reports gradual decline in her exercise tolerance with worsening fatigue.  She does not experience significant shortness of breath or chest discomfort.  She has not had any syncopal episodes.  She reports occasional mild dizziness.  She has occasional mild lower extremity edema.  She denies any history of PND, orthopnea, or resting shortness of breath.  She has been practicing social distancing and she denies any known exposure to persons with known or suspected COVID-19 infection.  She denies any history of recent fever or cough.    Past Medical History:  Diagnosis Date   Exogenous obesity    Hyperlipidemia    Hypertension    Hypothyroidism    Severe aortic stenosis    Type II diabetes mellitus (HCC)     Past Surgical History:  Procedure Laterality Date   LAPAROSCOPIC CHOLECYSTECTOMY  2008   RIGHT/LEFT HEART CATH AND CORONARY ANGIOGRAPHY N/A 03/24/2019   Procedure: RIGHT/LEFT HEART CATH AND CORONARY ANGIOGRAPHY;  Surgeon: Tonny Bollmanooper, Michael, MD;  [facility];  Service: Cardiovascular;  Laterality: N/A;    Family History  Problem Relation Age of Onset   Heart attack Father    Diabetes Sister    Hypertension Sister  Diabetes Sister    Kidney failure Sister    Diabetes Sister    Heart attack Sister     Social History   Socioeconomic History   Marital status: Widowed    Spouse  name: Not on file   Number of children: Not on file   Years of education: Not on file   Highest education level: Not on file  Occupational History   Not on file  Social Needs   Financial resource strain: Not on file   Food insecurity    Worry: Not on file    Inability: Not on file   Transportation needs    Medical: Not on file    Non-medical: Not on file  Tobacco Use   Smoking status: Never Smoker   Smokeless tobacco: Never Used  Substance and Sexual Activity   Alcohol use: No   Drug use: No   Sexual activity: Not on file  Lifestyle   Physical activity    Days per week: Not on file    Minutes per session: Not on file   Stress: Not on file  Relationships   Social connections    Talks on phone: Not on file    Gets together: Not on file    Attends religious service: Not on file    Active member of club or organization: Not on file    Attends meetings of clubs or organizations: Not on file    Relationship status: Not on file   Intimate partner violence    Fear of current or ex partner: Not on file    Emotionally abused: Not on file    Physically abused: Not on file    Forced sexual activity: Not on file  Other Topics Concern   Not on file  Social History Narrative   6 grandkids   4 great grandkids   2 great great grandkids    Current Outpatient Medications  Medication Sig Dispense Refill   acetaminophen (TYLENOL) 500 MG tablet Take 500 mg by mouth every 6 (six) hours as needed for mild pain, moderate pain or headache.      amLODipine (NORVASC) 10 MG tablet Take 1 tablet (10 mg total) by mouth daily. 30 tablet 6   apixaban (ELIQUIS) 5 MG TABS tablet Take 1 tablet (5 mg total) by mouth 2 (two) times daily. 180 tablet 3   Calcium Carb-Cholecalciferol (CALCIUM 600 + D PO) Take 1 tablet by mouth daily.     hydrochlorothiazide (HYDRODIURIL) 12.5 MG tablet Take 1 tablet (12.5 mg total) by mouth daily. 90 tablet 2   levothyroxine (SYNTHROID,  LEVOTHROID) 75 MCG tablet Take 75 mcg by mouth daily.     losartan (COZAAR) 50 MG tablet Take 1 tablet (50 mg total) by mouth daily. 90 tablet 3   metoprolol tartrate (LOPRESSOR) 25 MG tablet Take 1 tablet (25 mg total) by mouth 2 (two) times daily. 180 tablet 3   rosuvastatin (CRESTOR) 10 MG tablet TAKE 1 TABLET BY MOUTH EVERY OTHER DAY (Patient taking differently: Take 10 mg by mouth every other day. ) 60 tablet 1   spironolactone (ALDACTONE) 25 MG tablet Take 0.5 tablets (12.5 mg total) by mouth daily. 45 tablet 3   No current facility-administered medications for this visit.     Allergies  Allergen Reactions   Lisinopril     Angioneurotic edema      Review of Systems:   General:  normal appetite, decreased energy, no weight gain, no weight loss, no fever  Cardiac:  no chest pain with exertion, no chest pain at rest, noSOB with exertion, no resting SOB, no PND, no orthopnea, no palpitations, no arrhythmia, no atrial fibrillation, + LE edema, + dizzy spells, no syncope  Respiratory:  no shortness of breath, no home oxygen, no productive cough, no dry cough, no bronchitis, no wheezing, no hemoptysis, no asthma, no pain with inspiration or cough, no sleep apnea, no CPAP at night  GI:   no difficulty swallowing, no reflux, no frequent heartburn, no hiatal hernia, no abdominal pain, no constipation, no diarrhea, no hematochezia, no hematemesis, no melena  GU:   no dysuria,  no frequency, no urinary tract infection, no hematuria, no kidney stones, no kidney disease  Vascular:  no pain suggestive of claudication, no pain in feet, no leg cramps, no varicose veins, no DVT, no non-healing foot ulcer  Neuro:   no stroke, no TIA's, no seizures, no headaches, no temporary blindness one eye,  no slurred speech, no peripheral neuropathy, no chronic pain, no instability of gait, no memory/cognitive dysfunction  Musculoskeletal: no arthritis, no joint swelling, no myalgias, no difficulty walking,  normal mobility   Skin:   no rash, no itching, no skin infections, no pressure sores or ulcerations  Psych:   no anxiety, no depression, no nervousness, no unusual recent stress  Eyes:   no blurry vision, no floaters, no recent vision changes, + wears glasses or contacts  ENT:   no hearing loss, + loose or painful teeth, no dentures, last saw dentist > 2 years ago  Hematologic:  no easy bruising, no abnormal bleeding, no clotting disorder, no frequent epistaxis  Endocrine:  + diabetes, does not check CBG's at home          Physical Exam:   BP (!) 167/70 (BP Location: Right Arm, Patient Position: Sitting, Cuff Size: Large)    Pulse 62    Temp 97.7 F (36.5 C) (Other (Comment)) Comment (Src): THERMAL   Resp 16    Ht  (1.626 m)    Wt 198 lb (89.8 kg)    SpO2 96% Comment: RA   BMI 33.99 kg/m   General:  Elderly, moderately obese, otherwise well-appearing  HEENT:  Unremarkable   Neck:   no JVD, no bruits, no adenopathy   Chest:   clear to auscultation, symmetrical breath sounds, no wheezes, no rhonchi   CV:   RRR, grade III/VI crescendo/decrescendo murmur heard best at RSB,  no diastolic murmur  Abdomen:  soft, non-tender, no masses   Extremities:  warm, well-perfused, pulses diminished but palpable, no LE edema  Rectal/GU  Deferred  Neuro:   Grossly non-focal and symmetrical throughout  Skin:   Clean and dry, no rashes, no breakdown   Diagnostic Tests:  ECHOCARDIOGRAM REPORT       Patient Name:   [name] Date of Exam: 12/16/2018 Medical Rec #:  [id number]           Height:       64.0 in Accession #:    [id number]          Weight:       201.8 lb Date of Birth:  April 08, 1931           BSA:          1.96 m Patient Age:    87 years            BP:           140/70 mmHg Patient Gender: F  HR:           50 bpm. Exam Location:  [address]    Procedure: 2D Echo, 3D Echo, Cardiac Doppler and Color Doppler  Indications:    I35.0 Aortic Stenosis    History:        Patient has prior history of Echocardiogram examinations, most                 recent 09/24/2017. Atrial Fibrillation, Aortic Valve Disease;                 Signs/Symptoms: Murmur; Risk Factors: Hypertension,                 Dyslipidemia, Diabetes, Former Smoker and Family History of                 Coronary Artery Disease. Obesity, Edema, Aortic Stenosis (Prior                 Mean Gradient 32 mmHG).   Sonographer:    Farrel ConnersBethany Mcmahill RDCS Referring Phys: [phone] MICHAEL COOPER  IMPRESSIONS    1. The left ventricle has normal systolic function with an ejection fraction of 60-65%. The cavity size was normal. Left ventricular diastolic Doppler parameters are indeterminate.  2. The right ventricle has normal systolic function. The cavity was normal. There is no increase in right ventricular wall thickness. Right ventricular systolic pressure is mildly elevated with an estimated pressure of 39.4 mmHg.  3. Left atrial size was severely dilated.  4. Trivial pericardial effusion is present.  5. The mitral valve is normal in structure.  6. The tricuspid valve is normal in structure. Tricuspid valve regurgitation is moderate.  7. The aortic valve is tricuspid Severely thickening of the aortic valve Aortic valve regurgitation is trivial by color flow Doppler. severe stenosis of the aortic valve.  8. The pulmonic valve was normal in structure.  9. The aortic root is normal in size and structure. 10. Normal LV function; severe AS (peak velocity 4.1 m/s and mean gradient 40 mmHg); trace AI; mild MR; severe LAE; moderate TR; mild pulmonary hypertension.  FINDINGS  Left Ventricle: The left ventricle has normal systolic function, with an ejection fraction of 60-65%. The cavity size was normal. There is no increase in left ventricular wall thickness. Left ventricular diastolic Doppler parameters are indeterminate Right Ventricle: The right ventricle has normal systolic function. The  cavity was normal. There is no increase in right ventricular wall thickness. Right ventricular systolic pressure is mildly elevated with an estimated pressure of 39.4 mmHg. Left Atrium: left atrial size was severely dilated Right Atrium: right atrial size was normal in size. Right atrial pressure is estimated at 3 mmHg. Interatrial Septum: No atrial level shunt detected by color flow Doppler. Pericardium: Trivial pericardial effusion is present. Mitral Valve: The mitral valve is normal in structure. Mitral valve regurgitation is mild by color flow Doppler. Tricuspid Valve: The tricuspid valve is normal in structure. Tricuspid valve regurgitation is moderate by color flow Doppler. Aortic Valve: The aortic valve is tricuspid Severely thickening of the aortic valve. Aortic valve regurgitation is trivial by color flow Doppler. There is severe stenosis of the aortic valve, with a calculated valve area of 0.39 cm. Pulmonic Valve: The pulmonic valve was normal in structure. Pulmonic valve regurgitation is trivial by color flow Doppler. Aorta: The aortic root is normal in size and structure. Venous: The inferior vena cava is normal in size with greater than 50% respiratory variability.  LEFT VENTRICLE PLAX 2D (Teich) LV EF:          65.9 %   Diastology LVIDd:          4.51 cm  LV e' lateral:   7.07 cm/s LVIDs:          2.88 cm  LV E/e' lateral: 19.1 LV PW:          1.10 cm  LV e' medial:    9.14 cm/s LV IVS:         0.97 cm  LV E/e' medial:  14.8 LVOT diam:      1.90 cm LV SV:          61 ml LVOT Area:      2.84 cm  RIGHT VENTRICLE RV S prime:     10.60 cm/s TAPSE (M-mode): 2.2 cm RVSP:           39.4 mmHg  LEFT ATRIUM              Index       RIGHT ATRIUM           Index LA diam:        5.00 cm  2.55 cm/m  RA Pressure: 3 mmHg LA Vol (A2C):   97.9 ml  49.85 ml/m RA Area:     16.80 cm LA Vol (A4C):   132.0 ml 67.22 ml/m RA Volume:   40.70 ml  20.72 ml/m LA Biplane Vol: 123.0 ml 62.63  ml/m  AORTIC VALVE AV Area (Vmax):    0.35 cm AV Area (Vmean):   0.34 cm AV Area (VTI):     0.39 cm AV Vmax:           414.00 cm/s AV Vmean:          277.667 cm/s AV VTI:            1.103 m AV Peak Grad:      65.0 mmHg AV Mean Grad:      40.0 mmHg LVOT Vmax:         50.20 cm/s LVOT Vmean:        33.600 cm/s LVOT VTI:          0.153 m LVOT/AV VTI ratio: 0.14 AR PHT:            892 msec   AORTA Ao Root diam: 3.20 cm Ao Asc diam:  3.20 cm  MITRAL VALVE               TRICUSPID VALVE MV Area (PHT): cm         TR Peak grad:   36.4 mmHg MV PHT:        msec        TR Vmax:        317.00 cm/s MV Decel Time: 222 msec    RVSP:           39.4 mmHg MR Peak grad: 179.0 mmHg MR Mean grad: 109.0 mmHg MR Vmax:      669.00 cm/s MR Vmean:     482.0 cm/s MR PISA:      3.08 MV E velocity: 135.00 cm/s MV A velocity: 41.00 cm/s MV E/A ratio:  3.29    Olga Millers MD Electronically signed by Olga Millers MD Signature Date/Time: 12/16/2018/3:34:11 PM    RIGHT/LEFT HEART CATH AND CORONARY ANGIOGRAPHY  Conclusion  1.  Mild nonobstructive coronary artery disease with 40 to 50% stenosis in the proximal RCA, diffuse irregularity of the  LAD, and minor diffuse irregularity of the left circumflex 2.  Calcified, restricted aortic valve with mean transvalvular gradient 31 mmHg, peak to peak gradient 46 mmHg 3.  Normal right heart hemodynamics  Plan: Severe calcific aortic stenosis, continued medical therapy for nonobstructive CAD, continued multidisciplinary evaluation for treatment of severe aortic stenosis.  Indications  Severe aortic stenosis [I35.0 (ICD-10-CM)]  Procedural Details  Technical Details INDICATION: Severe symptomatic aortic stenosis  PROCEDURAL DETAILS: There was an indwelling IV in a right antecubital vein. Using normal sterile technique, the IV was changed out for a 5 Fr brachial sheath over a 0.018 inch wire. The right wrist was then prepped, draped, and  anesthetized with 1% lidocaine. Using the modified Seldinger technique a 5/6 French Slender sheath was placed in the right radial artery. Intra-arterial verapamil was administered through the radial artery sheath. IV heparin was administered after a JR4 catheter was advanced into the central aorta. A Swan-Ganz catheter was used for the right heart catheterization. Standard protocol was followed for recording of right heart pressures and sampling of oxygen saturations. Fick cardiac output was calculated. Standard Judkins catheters were used for selective coronary angiography. LV pressure is recorded and an aortic valve pullback is performed. There were no immediate procedural complications. The patient was transferred to the post catheterization recovery area for further monitoring.    Estimated blood loss <50 mL.   During this procedure medications were administered to achieve and maintain moderate conscious sedation while the patient's heart rate, blood pressure, and oxygen saturation were continuously monitored and I was present face-to-face 100% of this time.  Medications (Filter: Administrations occurring from 03/24/19 1055 to 03/24/19 1140) (important)  Continuous medications are totaled by the amount administered until 03/24/19 1140.  Medication Rate/Dose/Volume Action  Date Time   Heparin (Porcine) in NaCl 1000-0.9 UT/500ML-% SOLN (mL) 500 mL Given 03/24/19 1104   Total dose as of 03/24/19 1140        500 mL        Heparin (Porcine) in NaCl 1000-0.9 UT/500ML-% SOLN (mL) 500 mL Given 03/24/19 1104   Total dose as of 03/24/19 1140        500 mL        fentaNYL (SUBLIMAZE) injection (mcg) 25 mcg Given 03/24/19 1104   Total dose as of 03/24/19 1140        25 mcg        midazolam (VERSED) injection (mg) 1 mg Given 03/24/19 1104   Total dose as of 03/24/19 1140        1 mg        lidocaine (PF) (XYLOCAINE) 1 % injection (mL) 2 mL Given 03/24/19 1107   Total dose as of 03/24/19 1140 2 mL  Given 1118   4 mL        Radial Cocktail/Verapamil only (mL)  Given 03/24/19 1118   Total dose as of 03/24/19 1140        Cannot be calculated        heparin injection (Units) 5,000 Units Given 03/24/19 1121   Total dose as of 03/24/19 1140        5,000 Units        iohexol (OMNIPAQUE) 350 MG/ML injection (mL) 55 mL Given 03/24/19 1136   Total dose as of 03/24/19 1140        55 mL        Sedation Time  Sedation Time Physician-1: 25 minutes 51 seconds  Coronary Findings  Diagnostic Dominance: Right Left  Anterior Descending  There is mild diffuse disease throughout the vessel.  First Septal Branch  There is mild disease in the vessel.  Left Circumflex  Second Obtuse Marginal Branch  Ost 2nd Mrg to 2nd Mrg lesion 30% stenosed  Ost 2nd Mrg to 2nd Mrg lesion is 30% stenosed.  Right Coronary Artery  Prox RCA lesion 50% stenosed  Prox RCA lesion is 50% stenosed. 40-50% focal stenosis  Intervention  No interventions have been documented. Left Heart  Aortic Valve There is severe aortic valve stenosis. The aortic valve is calcified. There is restricted aortic valve motion.  Coronary Diagrams  Diagnostic Dominance: Right  Intervention  Implants   No implant documentation for this case.  Syngo Images  Show images for CARDIAC CATHETERIZATION  Images on Long Term Storage  Show images for [name], [name] to Procedure Log  Procedure Log    Hemo Data   Most Recent Value  Fick Cardiac Output 8.5 L/min  Fick Cardiac Output Index 4.36 (L/min)/BSA  Aortic Valve Area 1.66  Aortic Value Area Index 0.85 cm2/BSA  QP/QS 1  TPVR Index 4.13 HRUI  TSVR Index 17.89 HRUI  TPVR/TSVR Ratio 0.23       Cardiac TAVR CT  TECHNIQUE: The patient was scanned on a [employer]. A 120 kV retrospective scan was triggered in the descending thoracic aorta at 111 HU's. Gantry rotation speed was 250 msecs and collimation was .6 mm. No beta blockade or nitro were  given. The 3D data set was reconstructed in 5% intervals of the R-R cycle. Systolic and diastolic phases were analyzed on a dedicated work station using MPR, MIP and VRT modes. The patient received 80 cc of contrast.  FINDINGS: Aortic Valve: Trileaflet aortic valve with severely thickened and calcified leaflets and minimal calcifications extending into the LVOT.  Aorta: Normal size with mild diffuse atherosclerotic plaque and calcifications and no dissection.  Sinotubular Junction: 27 x 26 mm  Ascending Thoracic Aorta: 33 x 32 mm  Aortic Arch: 26 x 26 mm  Descending Thoracic Aorta: 23 x 23 mm  Sinus of Valsalva Measurements:  Non-coronary:  29 mm  Right -coronary:  31 mm  Left -coronary:  32 mm  Coronary Artery Height above Annulus:  Left Main: 9.6 mm  Right Coronary: 11.7 mm  Virtual Basal Annulus Measurements:  Maximum/Minimum Diameter: 25.6 x 21.1 mm  Mean Diameter: 22.5 mm  Perimeter: 72.9 mm  Area: 396 mm2  Optimum Fluoroscopic Angle for Delivery:  LAO 6 CAU 8.  IMPRESSION: 1. Trileaflet aortic valve with severely thickened and calcified leaflets and minimal calcifications extending into the LVOT. Annular measurements are suitable for delivery of a 23 mm Edwards-SAPIEN 3 valve.  2. Aortic valve calcium score is 2316 and consistent with severe aortic stenosis.  3. Sufficient coronary to annulus distance.  4. Optimum Fluoroscopic Angle for Delivery: LAO 6 CAU 8.  5. No thrombus in the left atrial appendage.  6. Moderately dilated pulmonary artery consistent with pulmonary hypertension.  Electronically Signed: By: Tobias Alexander On: 03/31/2019 13:03   CT ANGIOGRAPHY CHEST, ABDOMEN AND PELVIS  TECHNIQUE: Multidetector CT imaging through the chest, abdomen and pelvis was performed using the standard protocol during bolus administration of intravenous contrast. Multiplanar reconstructed images and MIPs  were obtained and reviewed to evaluate the vascular anatomy.  CONTRAST:  OMNIPAQUE IOHEXOL 350 MG/ML SOLN  COMPARISON:  CT the chest, abdomen and pelvis 04/03/2016.  FINDINGS: CTA CHEST FINDINGS  Cardiovascular: Heart size  is mildly enlarged with left atrial dilatation. There is no significant pericardial fluid, thickening or pericardial calcification. There is aortic atherosclerosis, as well as atherosclerosis of the great vessels of the mediastinum and the coronary arteries, including calcified atherosclerotic plaque in the left main, left anterior descending and right coronary arteries. Thickening calcification of the aortic valve.  Mediastinum/Lymph Nodes: No pathologically enlarged mediastinal or hilar lymph nodes. Esophagus is unremarkable in appearance. No axillary lymphadenopathy.  Lungs/Pleura: No acute consolidative airspace disease. No pleural effusions. No suspicious appearing pulmonary nodules or masses are noted.  Musculoskeletal/Soft Tissues: There are no aggressive appearing lytic or blastic lesions noted in the visualized portions of the skeleton.  CTA ABDOMEN AND PELVIS FINDINGS  Hepatobiliary: No suspicious cystic or solid hepatic lesions. No intra or extrahepatic biliary ductal dilatation. Status post cholecystectomy.  Pancreas: No pancreatic mass. No pancreatic ductal dilatation. No pancreatic or peripancreatic fluid or inflammatory changes.  Spleen: Unremarkable.  Adrenals/Urinary Tract: 5.2 cm low-attenuation lesion in the interpolar region of the left kidney is compatible with a simple cyst. Other subcentimeter low-attenuation lesions in both kidneys, too small to characterize, but statistically likely to represent cysts. Scarring at site of ruptured cyst in the lower pole of the right kidney incidentally noted. No suspicious renal lesions. No hydroureteronephrosis. Urinary bladder is normal in appearance. Bilateral adrenal  glands are normal in appearance.  Stomach/Bowel: Normal appearance of the stomach. No pathologic dilatation of small bowel or colon. Numerous colonic diverticulae are noted, without surrounding inflammatory changes to suggest an acute diverticulitis at this time. Normal appendix.  Vascular/Lymphatic: Aortic atherosclerosis, without evidence of aneurysm or dissection in the abdominal or pelvic vasculature. No lymphadenopathy noted in the abdomen or pelvis.  Reproductive: Uterus and ovaries are unremarkable in appearance.  Other: No significant volume of ascites.  No pneumoperitoneum.  Musculoskeletal: There are no aggressive appearing lytic or blastic lesions noted in the visualized portions of the skeleton.  VASCULAR MEASUREMENTS PERTINENT TO TAVR:  AORTA:  Minimal Aortic Diameter-16 x 16 mm  Severity of Aortic Calcification-mild  RIGHT PELVIS:  Right Common Iliac Artery -  Minimal Diameter-8.8 x 6.9 mm  Tortuosity-mild  Calcification-moderate  Right External Iliac Artery -  Minimal Diameter-7.5 x 5.8 mm  Tortuosity-moderate  Calcification-moderate  Right Common Femoral Artery -  Minimal Diameter-7.2 x 6.0 mm  Tortuosity-mild  Calcification-moderate  LEFT PELVIS:  Left Common Iliac Artery -  Minimal Diameter-6.9 x 7.9 mm  Tortuosity-mild  Calcification-moderate  Left External Iliac Artery -  Minimal Diameter-7.2 x 6.2 mm  Tortuosity-moderate  Calcification-moderate  Left Common Femoral Artery -  Minimal Diameter-7.8 x 4.7 mm  Tortuosity-mild  Calcification-moderate  Review of the MIP images confirms the above findings.  IMPRESSION: 1. Vascular findings and measurements pertinent to potential TAVR procedure, as detailed above. 2. Severe thickening calcification of the aortic valve, compatible with the reported clinical history of severe aortic stenosis. 3. Aortic atherosclerosis, in addition to  left main and 2 vessel coronary artery disease. 4. Colonic diverticulosis, without evidence of acute diverticulitis at this time. 5. Additional incidental findings, as above.   Electronically Signed   By: Trudie Reed M.D.   On: 03/31/2019 15:13   STS Risk Calculator  Procedure: Isolated AVR   Risk of Mortality: 3.624%  Renal Failure: 7.131%  Permanent Stroke: 1.894%  Prolonged Ventilation: 12.611%  DSW Infection: 0.128%  Reoperation: 3.089%  Morbidity or Mortality: 16.832%  Short Length of Stay: 12.344%  Long Length of Stay: 13.657%      Impression:  Patient has stage D severe symptomatic aortic stenosis.  She describes a gradual decline in exercise tolerance with increasing fatigue and some lower extremity edema, consistent with chronic diastolic congestive heart failure, New York Heart Association functional class II.  I personally reviewed the patient's most recent transthoracic echocardiogram, diagnostic cardiac catheterization, and CT angiograms.  Echocardiogram reveals severe aortic stenosis.  The aortic valve is trileaflet with severe thickening, calcification, and restricted leaflet mobility involving all 3 leaflets.  Peak velocity across aortic valve measured greater than 4.1 m/s corresponding to mean transvalvular gradient estimated 40 mmHg.  Left ventricular systolic function remains normal.  The DVI was quite low and reported 0.14.  Diagnostic cardiac catheterization confirmed the presence of severe aortic stenosis and revealed moderate nonobstructive coronary artery disease.  I agree the patient would benefit from elective aortic valve replacement.  There might also be some benefit with concomitant Maze procedure, but risks associated with conventional surgery would be at least moderately elevated because of the patient's advanced age.  Furthermore, the patient has relatively small sized aortic annulus and aortic root.  Cardiac-gated CTA of the heart reveals  anatomical characteristics consistent with aortic stenosis suitable for treatment by transcatheter aortic valve replacement without any significant complicating features and CTA of the aorta and iliac vessels demonstrate what appears to be adequate pelvic vascular access to facilitate a transfemoral approach.  I favor transcatheter aortic valve replacement is a far less invasive and likely less risky approach for treating this elderly patient.  Prior to surgery the patient will need to undergo dental service consultation and likely dental extraction because of complaints of a sore tooth that is "rotten" according to the patient.    Plan:  The patient was counseled at length regarding treatment alternatives for management of severe symptomatic aortic stenosis. Alternative approaches such as conventional aortic valve replacement, transcatheter aortic valve replacement, and continued medical therapy without intervention were compared and contrasted at length.  The risks associated with conventional surgical aortic valve replacement were discussed in detail, as were expectations for post-operative convalescence, and why I would be reluctant to consider this patient a candidate for conventional surgery.  Issues specific to transcatheter aortic valve replacement were discussed including questions about long term valve durability, the potential for paravalvular leak, possible increased risk of need for permanent pacemaker placement, and other technical complications related to the procedure itself.  Long-term prognosis with medical therapy was discussed. This discussion was placed in the context of the patient's own specific clinical presentation and past medical history.  All of their questions have been addressed.  The patient hopes to proceed with transcatheter aortic valve replacement as soon as practical.  As a neck step the patient will be referred for dental service consultation.  Once this has been  accomplished we can make final plans for surgical intervention.  Following the decision to proceed with transcatheter aortic valve replacement, a discussion has been held regarding what types of management strategies would be attempted intraoperatively in the event of life-threatening complications, including whether or not the patient would be considered a candidate for the use of cardiopulmonary bypass and/or conversion to open sternotomy for attempted surgical intervention.  The patient has been advised of a variety of complications that might develop including but not limited to risks of death, stroke, paravalvular leak, aortic dissection or other major vascular complications, aortic annulus rupture, device embolization, cardiac rupture or perforation, mitral regurgitation, acute myocardial infarction, arrhythmia, heart block or bradycardia requiring permanent pacemaker placement, congestive  heart failure, respiratory failure, renal failure, pneumonia, infection, other late complications related to structural valve deterioration or migration, or other complications that might ultimately cause a temporary or permanent loss of functional independence or other long term morbidity.  The patient provides full informed consent for the procedure as described and all questions were answered.    I spent in excess of 90 minutes during the conduct of this office consultation and >50% of this time involved direct face-to-face encounter with the patient for counseling and/or coordination of their care.     Salvatore Decent. Cornelius Moras, MD 04/08/2019 4:37 PM
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
## 2019-04-08 NOTE — Progress Notes (Signed)
°[facility]  °[facility] ° °CARDIOTHORACIC SURGERY CONSULTATION REPORT ° °Referring Provider is Cooper, Michael, MD °PCP is Elkins, Wilson Oliver, MD ° °Chief Complaint  °Patient presents with  °• Aortic Stenosis  °  TAVR EVAL..all required studies have been completed except fpr PFT  ° ° °HPI: ° °Patient is [name] with history of aortic stenosis, hypertension, PAF on long-term anticoagulation, type 2 diabetes mellitus, and hyperlipidemia who has been referred for surgical consultation to discuss treatment options for management of severe symptomatic aortic stenosis. ° °Patient has been relatively healthy, physically active, and functionally independent for all of her adult life.  She has been treated for essential hypertension and type 2 diabetes mellitus for which she is currently diet controlled.  She was noted to have a heart murmur on physical exam many years ago and was diagnosed with aortic stenosis.  For the last several years she has been followed by Dr. Cooper with serial echocardiograms.  Approximately 3 years ago she was noted to be in atrial fibrillation and started on long-term anticoagulation using Eliquis.  Most recent twelve-lead EKG revealed normal sinus rhythm.  Over the past year the patient has developed decreased exercise tolerance with worsening fatigue.  Follow-up transthoracic echocardiogram performed December 16, 2018 revealed significant progression and severity of aortic stenosis with peak velocity across aortic valve measured greater than 4.1 m/s corresponding to mean transvalvular gradient estimated 40 mmHg.  The DVI was notably quite low at 0.14 and aortic valve area calculated only 0.35 cm².  Left ventricular systolic function remain preserved.  Left and right heart catheterization was performed March 24, 2019 and confirmed the presence of severe aortic stenosis with peak to peak and mean transvalvular  gradients measured 46 and 31 mmHg, respectively.  There was mild nonobstructive coronary artery disease with 40 to 50% stenosis of the proximal right coronary artery, diffuse irregularity of the left anterior descending coronary artery and minor irregularities in the left circumflex system.  CT angiography was performed and the patient has been referred for surgical consultation. ° °Patient is widowed and lives with her adult son and daughter-in-law in [place].  The patient continues to drive an automobile and remains functionally independent.  She enjoys gardening.  She reports gradual decline in her exercise tolerance with worsening fatigue.  She does not experience significant shortness of breath or chest discomfort.  She has not had any syncopal episodes.  She reports occasional mild dizziness.  She has occasional mild lower extremity edema.  She denies any history of PND, orthopnea, or resting shortness of breath.  She has been practicing social distancing and she denies any known exposure to persons with known or suspected COVID-19 infection.  She denies any history of recent fever or cough.   ° °Past Medical History:  °Diagnosis Date  °• Exogenous obesity   °• Hyperlipidemia   °• Hypertension   °• Hypothyroidism   °• Severe aortic stenosis   °• Type II diabetes mellitus (HCC)   ° ° °Past Surgical History:  °Procedure Laterality Date  °• LAPAROSCOPIC CHOLECYSTECTOMY  2008  °• RIGHT/LEFT HEART CATH AND CORONARY ANGIOGRAPHY N/A 03/24/2019  ° Procedure: RIGHT/LEFT HEART CATH AND CORONARY ANGIOGRAPHY;  Surgeon: Cooper, Michael, MD;  Location: [facility];  Service: Cardiovascular;  Laterality: N/A;  ° ° °Family History  °Problem Relation Age of Onset  °• Heart attack Father   °• Diabetes Sister   °• Hypertension Sister   °•   Diabetes Sister   °• Kidney failure Sister   °• Diabetes Sister   °• Heart attack Sister   ° ° °Social History  ° °Socioeconomic History  °• Marital status: Widowed  °  Spouse  name: Not on file  °• Number of children: Not on file  °• Years of education: Not on file  °• Highest education level: Not on file  °Occupational History  °• Not on file  °Social Needs  °• Financial resource strain: Not on file  °• Food insecurity  °  Worry: Not on file  °  Inability: Not on file  °• Transportation needs  °  Medical: Not on file  °  Non-medical: Not on file  °Tobacco Use  °• Smoking status: Never Smoker  °• Smokeless tobacco: Never Used  °Substance and Sexual Activity  °• Alcohol use: No  °• Drug use: No  °• Sexual activity: Not on file  °Lifestyle  °• Physical activity  °  Days per week: Not on file  °  Minutes per session: Not on file  °• Stress: Not on file  °Relationships  °• Social connections  °  Talks on phone: Not on file  °  Gets together: Not on file  °  Attends religious service: Not on file  °  Active member of club or organization: Not on file  °  Attends meetings of clubs or organizations: Not on file  °  Relationship status: Not on file  °• Intimate partner violence  °  Fear of current or ex partner: Not on file  °  Emotionally abused: Not on file  °  Physically abused: Not on file  °  Forced sexual activity: Not on file  °Other Topics Concern  °• Not on file  °Social History Narrative  ° 6 grandkids  ° 4 great grandkids  ° 2 great great grandkids  ° ° °Current Outpatient Medications  °Medication Sig Dispense Refill  °• acetaminophen (TYLENOL) 500 MG tablet Take 500 mg by mouth every 6 (six) hours as needed for mild pain, moderate pain or headache.     °• amLODipine (NORVASC) 10 MG tablet Take 1 tablet (10 mg total) by mouth daily. 30 tablet 6  °• apixaban (ELIQUIS) 5 MG TABS tablet Take 1 tablet (5 mg total) by mouth 2 (two) times daily. 180 tablet 3  °• Calcium Carb-Cholecalciferol (CALCIUM 600 + D PO) Take 1 tablet by mouth daily.    °• hydrochlorothiazide (HYDRODIURIL) 12.5 MG tablet Take 1 tablet (12.5 mg total) by mouth daily. 90 tablet 2  °• levothyroxine (SYNTHROID,  LEVOTHROID) 75 MCG tablet Take 75 mcg by mouth daily.    °• losartan (COZAAR) 50 MG tablet Take 1 tablet (50 mg total) by mouth daily. 90 tablet 3  °• metoprolol tartrate (LOPRESSOR) 25 MG tablet Take 1 tablet (25 mg total) by mouth 2 (two) times daily. 180 tablet 3  °• rosuvastatin (CRESTOR) 10 MG tablet TAKE 1 TABLET BY MOUTH EVERY OTHER DAY (Patient taking differently: Take 10 mg by mouth every other day. ) 60 tablet 1  °• spironolactone (ALDACTONE) 25 MG tablet Take 0.5 tablets (12.5 mg total) by mouth daily. 45 tablet 3  ° °No current facility-administered medications for this visit.   ° ° °Allergies  °Allergen Reactions  °• Lisinopril   °  Angioneurotic edema  ° ° ° ° °Review of Systems: ° ° General:  normal appetite, decreased energy, no weight gain, no weight loss, no fever ° Cardiac:    no chest pain with exertion, no chest pain at rest, noSOB with exertion, no resting SOB, no PND, no orthopnea, no palpitations, no arrhythmia, no atrial fibrillation, + LE edema, + dizzy spells, no syncope ° Respiratory:  no shortness of breath, no home oxygen, no productive cough, no dry cough, no bronchitis, no wheezing, no hemoptysis, no asthma, no pain with inspiration or cough, no sleep apnea, no CPAP at night ° GI:   no difficulty swallowing, no reflux, no frequent heartburn, no hiatal hernia, no abdominal pain, no constipation, no diarrhea, no hematochezia, no hematemesis, no melena ° GU:   no dysuria,  no frequency, no urinary tract infection, no hematuria, no kidney stones, no kidney disease ° Vascular:  no pain suggestive of claudication, no pain in feet, no leg cramps, no varicose veins, no DVT, no non-healing foot ulcer ° Neuro:   no stroke, no TIA's, no seizures, no headaches, no temporary blindness one eye,  no slurred speech, no peripheral neuropathy, no chronic pain, no instability of gait, no memory/cognitive dysfunction ° Musculoskeletal: no arthritis, no joint swelling, no myalgias, no difficulty walking,  normal mobility  ° Skin:   no rash, no itching, no skin infections, no pressure sores or ulcerations ° Psych:   no anxiety, no depression, no nervousness, no unusual recent stress ° Eyes:   no blurry vision, no floaters, no recent vision changes, + wears glasses or contacts ° ENT:   no hearing loss, + loose or painful teeth, no dentures, last saw dentist > 2 years ago ° Hematologic:  no easy bruising, no abnormal bleeding, no clotting disorder, no frequent epistaxis ° Endocrine:  + diabetes, does not check CBG's at home °  °  ° °  °  °Physical Exam: ° ° BP (!) 167/70 (BP Location: Right Arm, Patient Position: Sitting, Cuff Size: Large)    Pulse 62    Temp 97.7 °F (36.5 °C) (Other (Comment)) Comment (Src): THERMAL   Resp 16    Ht 5' 4" (1.626 m)    Wt 198 lb (89.8 kg)    SpO2 96% Comment: RA   BMI 33.99 kg/m²  ° General:  Elderly, moderately obese, otherwise well-appearing ° HEENT:  Unremarkable  ° Neck:   no JVD, no bruits, no adenopathy  ° Chest:   clear to auscultation, symmetrical breath sounds, no wheezes, no rhonchi  ° CV:   RRR, grade III/VI crescendo/decrescendo murmur heard best at RSB,  no diastolic murmur ° Abdomen:  soft, non-tender, no masses  ° Extremities:  warm, well-perfused, pulses diminished but palpable, no LE edema ° Rectal/GU  Deferred ° Neuro:   Grossly non-focal and symmetrical throughout ° Skin:   Clean and dry, no rashes, no breakdown ° ° °Diagnostic Tests: ° °ECHOCARDIOGRAM REPORT   °  °  °  °Patient Name:   [name] Date of Exam: 12/16/2018 °Medical Rec #:  [id number]           Height:       64.0 in °Accession #:    [id number]          Weight:       201.8 lb °Date of Birth:  02/22/1931           BSA:          1.96 m² °Patient Age:    85 years            BP:           140/70 mmHg °Patient Gender: F                     HR:           50 bpm. °Exam Location:  [address] °  °  °Procedure: 2D Echo, 3D Echo, Cardiac Doppler and Color Doppler °  °Indications:    I35.0 Aortic Stenosis °   °History:        Patient has prior history of Echocardiogram examinations, most °                recent 09/24/2017. Atrial Fibrillation, Aortic Valve Disease; °                Signs/Symptoms: Murmur; Risk Factors: Hypertension, °                Dyslipidemia, Diabetes, Former Smoker and Family History of °                Coronary Artery Disease. Obesity, Edema, Aortic Stenosis (Prior °                Mean Gradient 32 mmHG). °  °Sonographer:    Bethany Mcmahill RDCS °Referring Phys: [id number] MICHAEL COOPER °  °IMPRESSIONS °  °  ° 1. The left ventricle has normal systolic function with an ejection fraction of 60-65%. The cavity size was normal. Left ventricular diastolic Doppler parameters are indeterminate. ° 2. The right ventricle has normal systolic function. The cavity was normal. There is no increase in right ventricular wall thickness. Right ventricular systolic pressure is mildly elevated with an estimated pressure of 39.4 mmHg. ° 3. Left atrial size was severely dilated. ° 4. Trivial pericardial effusion is present. ° 5. The mitral valve is normal in structure. ° 6. The tricuspid valve is normal in structure. Tricuspid valve regurgitation is moderate. ° 7. The aortic valve is tricuspid Severely thickening of the aortic valve Aortic valve regurgitation is trivial by color flow Doppler. severe stenosis of the aortic valve. ° 8. The pulmonic valve was normal in structure. ° 9. The aortic root is normal in size and structure. °10. Normal LV function; severe AS (peak velocity 4.1 m/s and mean gradient 40 mmHg); trace AI; mild MR; severe LAE; moderate TR; mild pulmonary hypertension. °  °FINDINGS ° Left Ventricle: The left ventricle has normal systolic function, with an ejection fraction of 60-65%. The cavity size was normal. There is no increase in left ventricular wall thickness. Left ventricular diastolic Doppler parameters are indeterminate °Right Ventricle: The right ventricle has normal systolic function. The  cavity was normal. There is no increase in right ventricular wall thickness. Right ventricular systolic pressure is mildly elevated with an estimated pressure of 39.4 mmHg. °Left Atrium: left atrial size was severely dilated °Right Atrium: right atrial size was normal in size. Right atrial pressure is estimated at 3 mmHg. °Interatrial Septum: No atrial level shunt detected by color flow Doppler. °Pericardium: Trivial pericardial effusion is present. °Mitral Valve: The mitral valve is normal in structure. Mitral valve regurgitation is mild by color flow Doppler. °Tricuspid Valve: The tricuspid valve is normal in structure. Tricuspid valve regurgitation is moderate by color flow Doppler. °Aortic Valve: The aortic valve is tricuspid Severely thickening of the aortic valve. Aortic valve regurgitation is trivial by color flow Doppler. There is severe stenosis of the aortic valve, with a calculated valve area of 0.39 cm². °Pulmonic Valve: The pulmonic valve was normal in structure. Pulmonic valve regurgitation is trivial by color flow Doppler. °Aorta: The aortic root is normal in size and structure. °Venous: The inferior vena cava is normal in size with greater than 50% respiratory variability. °  °  LEFT VENTRICLE °PLAX 2D (Teich) °LV EF:          65.9 %   Diastology °LVIDd:          4.51 cm  LV e' lateral:   7.07 cm/s °LVIDs:          2.88 cm  LV E/e' lateral: 19.1 °LV PW:          1.10 cm  LV e' medial:    9.14 cm/s °LV IVS:         0.97 cm  LV E/e' medial:  14.8 °LVOT diam:      1.90 cm °LV SV:          61 ml °LVOT Area:      2.84 cm² °  °RIGHT VENTRICLE °RV S prime:     10.60 cm/s °TAPSE (M-mode): 2.2 cm °RVSP:           39.4 mmHg °  °LEFT ATRIUM              Index       RIGHT ATRIUM           Index °LA diam:        5.00 cm  2.55 cm/m²  RA Pressure: 3 mmHg °LA Vol (A2C):   97.9 ml  49.85 ml/m² RA Area:     16.80 cm² °LA Vol (A4C):   132.0 ml 67.22 ml/m² RA Volume:   40.70 ml  20.72 ml/m² °LA Biplane Vol: 123.0 ml 62.63  ml/m² ° AORTIC VALVE °AV Area (Vmax):    0.35 cm² °AV Area (Vmean):   0.34 cm² °AV Area (VTI):     0.39 cm² °AV Vmax:           414.00 cm/s °AV Vmean:          277.667 cm/s °AV VTI:            1.103 m °AV Peak Grad:      65.0 mmHg °AV Mean Grad:      40.0 mmHg °LVOT Vmax:         50.20 cm/s °LVOT Vmean:        33.600 cm/s °LVOT VTI:          0.153 m °LVOT/AV VTI ratio: 0.14 °AR PHT:            892 msec °  °AORTA °Ao Root diam: 3.20 cm °Ao Asc diam:  3.20 cm °  °MITRAL VALVE               TRICUSPID VALVE °MV Area (PHT): cm²         TR Peak grad:   36.4 mmHg °MV PHT:        msec        TR Vmax:        317.00 cm/s °MV Decel Time: 222 msec    RVSP:           39.4 mmHg °MR Peak grad: 179.0 mmHg °MR Mean grad: 109.0 mmHg °MR Vmax:      669.00 cm/s °MR Vmean:     482.0 cm/s °MR PISA:      3.08 °MV E velocity: 135.00 cm/s °MV A velocity: 41.00 cm/s °MV E/A ratio:  3.29 °  °  °Brian Crenshaw MD °Electronically signed by Brian Crenshaw MD °Signature Date/Time: 12/16/2018/3:34:11 PM ° ° ° °RIGHT/LEFT HEART CATH AND CORONARY ANGIOGRAPHY  °Conclusion ° °1.  Mild nonobstructive coronary artery disease with 40 to 50% stenosis in the proximal RCA, diffuse irregularity of the   LAD, and minor diffuse irregularity of the left circumflex °2.  Calcified, restricted aortic valve with mean transvalvular gradient 31 mmHg, peak to peak gradient 46 mmHg °3.  Normal right heart hemodynamics °  °Plan: Severe calcific aortic stenosis, continued medical therapy for nonobstructive CAD, continued multidisciplinary evaluation for treatment of severe aortic stenosis.  °Indications ° °Severe aortic stenosis [I35.0 (ICD-10-CM)]  °Procedural Details ° °Technical Details INDICATION: Severe symptomatic aortic stenosis ° °PROCEDURAL DETAILS: There was an indwelling IV in a right antecubital vein. Using normal sterile technique, the IV was changed out for a 5 Fr brachial sheath over a 0.018 inch wire. The right wrist was then prepped, draped, and  anesthetized with 1% lidocaine. Using the modified Seldinger technique a 5/6 French Slender sheath was placed in the right radial artery. Intra-arterial verapamil was administered through the radial artery sheath. IV heparin was administered after a JR4 catheter was advanced into the central aorta. A Swan-Ganz catheter was used for the right heart catheterization. Standard protocol was followed for recording of right heart pressures and sampling of oxygen saturations. Fick cardiac output was calculated. Standard Judkins catheters were used for selective coronary angiography. LV pressure is recorded and an aortic valve pullback is performed. There were no immediate procedural complications. The patient was transferred to the post catheterization recovery area for further monitoring. ° ° ° °Estimated blood loss <50 mL.  ° °During this procedure medications were administered to achieve and maintain moderate conscious sedation while the patient's heart rate, blood pressure, and oxygen saturation were continuously monitored and I was present face-to-face 100% of this time.  °Medications °(Filter: Administrations occurring from 03/24/19 1055 to 03/24/19 1140) °(important)  Continuous medications are totaled by the amount administered until 03/24/19 1140.  °Medication Rate/Dose/Volume Action  Date Time   °Heparin (Porcine) in NaCl 1000-0.9 UT/500ML-% SOLN (mL) 500 mL Given 03/24/19 1104   °Total dose as of 03/24/19 1140        °500 mL        °Heparin (Porcine) in NaCl 1000-0.9 UT/500ML-% SOLN (mL) 500 mL Given 03/24/19 1104   °Total dose as of 03/24/19 1140        °500 mL        °fentaNYL (SUBLIMAZE) injection (mcg) 25 mcg Given 03/24/19 1104   °Total dose as of 03/24/19 1140        °25 mcg        °midazolam (VERSED) injection (mg) 1 mg Given 03/24/19 1104   °Total dose as of 03/24/19 1140        °1 mg        °lidocaine (PF) (XYLOCAINE) 1 % injection (mL) 2 mL Given 03/24/19 1107   °Total dose as of 03/24/19 1140 2 mL  Given 1118   °4 mL        °Radial Cocktail/Verapamil only (mL)  Given 03/24/19 1118   °Total dose as of 03/24/19 1140        °Cannot be calculated        °heparin injection (Units) 5,000 Units Given 03/24/19 1121   °Total dose as of 03/24/19 1140        °5,000 Units        °iohexol (OMNIPAQUE) 350 MG/ML injection (mL) 55 mL Given 03/24/19 1136   °Total dose as of 03/24/19 1140        °55 mL        °Sedation Time ° °Sedation Time Physician-1: 25 minutes 51 seconds  °Coronary Findings ° °Diagnostic °Dominance: Right °Left   Anterior Descending  °There is mild diffuse disease throughout the vessel.  °First Septal Branch  °There is mild disease in the vessel.  °Left Circumflex  °Second Obtuse Marginal Branch  °Ost 2nd Mrg to 2nd Mrg lesion 30% stenosed  °Ost 2nd Mrg to 2nd Mrg lesion is 30% stenosed.  °Right Coronary Artery  °Prox RCA lesion 50% stenosed  °Prox RCA lesion is 50% stenosed. 40-50% focal stenosis  °Intervention ° °No interventions have been documented. °Left Heart ° °Aortic Valve There is severe aortic valve stenosis. The aortic valve is calcified. There is restricted aortic valve motion.  °Coronary Diagrams ° °Diagnostic °Dominance: Right ° °Intervention ° °Implants    °No implant documentation for this case.  °Syngo Images ° ° Show images for CARDIAC CATHETERIZATION  °Images on Long Term Storage ° ° Show images for Greb, Raisha J  ° Link to Procedure Log ° °Procedure Log  °  °Hemo Data ° ° Most Recent Value  °Fick Cardiac Output 8.5 L/min  °Fick Cardiac Output Index 4.36 (L/min)/BSA  °Aortic Valve Area 1.66  °Aortic Value Area Index 0.85 cm2/BSA  °QP/QS 1  °TPVR Index 4.13 HRUI  °TSVR Index 17.89 HRUI  °TPVR/TSVR Ratio 0.23  ° ° ° ° ° Cardiac TAVR CT °  °TECHNIQUE: °The patient was scanned on a Phillips Force scanner. A 120 kV °retrospective scan was triggered in the descending thoracic aorta at °111 HU's. Gantry rotation speed was 250 msecs and collimation was .6 °mm. No beta blockade or nitro were  given. The 3D data set was °reconstructed in 5% intervals of the R-R cycle. Systolic and °diastolic phases were analyzed on a dedicated work station using °MPR, MIP and VRT modes. The patient received 80 cc of contrast. °  °FINDINGS: °Aortic Valve: Trileaflet aortic valve with severely thickened and °calcified leaflets and minimal calcifications extending into the °LVOT. °  °Aorta: Normal size with mild diffuse atherosclerotic plaque and °calcifications and no dissection. °  °Sinotubular Junction: 27 x 26 mm °  °Ascending Thoracic Aorta: 33 x 32 mm °  °Aortic Arch: 26 x 26 mm °  °Descending Thoracic Aorta: 23 x 23 mm °  °Sinus of Valsalva Measurements: °  °Non-coronary:  29 mm °  °Right -coronary:  31 mm °  °Left -coronary:  32 mm °  °Coronary Artery Height above Annulus: °  °Left Main: 9.6 mm °  °Right Coronary: 11.7 mm °  °Virtual Basal Annulus Measurements: °  °Maximum/Minimum Diameter: 25.6 x 21.1 mm °  °Mean Diameter: 22.5 mm °  °Perimeter: 72.9 mm °  °Area: 396 mm2 °  °Optimum Fluoroscopic Angle for Delivery:  LAO 6 CAU 8. °  °IMPRESSION: °1. Trileaflet aortic valve with severely thickened and calcified °leaflets and minimal calcifications extending into the LVOT. Annular °measurements are suitable for delivery of a 23 mm Edwards-SAPIEN 3 °valve. °  °2. Aortic valve calcium score is 2316 and consistent with severe °aortic stenosis. °  °3. Sufficient coronary to annulus distance. °  °4. Optimum Fluoroscopic Angle for Delivery: LAO 6 CAU 8. °  °5. No thrombus in the left atrial appendage. °  °6. Moderately dilated pulmonary artery consistent with pulmonary °hypertension. °  °Electronically Signed: °By: Katarina  Nelson °On: 03/31/2019 13:03 ° ° °CT ANGIOGRAPHY CHEST, ABDOMEN AND PELVIS °  °TECHNIQUE: °Multidetector CT imaging through the chest, abdomen and pelvis was °performed using the standard protocol during bolus administration of °intravenous contrast. Multiplanar reconstructed images and MIPs  were °obtained and reviewed to evaluate the vascular anatomy. °  °CONTRAST:  100mL OMNIPAQUE IOHEXOL 350 MG/ML SOLN °  °COMPARISON:  CT the chest, abdomen and pelvis 04/03/2016. °  °FINDINGS: °CTA CHEST FINDINGS °  °Cardiovascular: Heart size   is mildly enlarged with left atrial °dilatation. There is no significant pericardial fluid, thickening or °pericardial calcification. There is aortic atherosclerosis, as well °as atherosclerosis of the great vessels of the mediastinum and the °coronary arteries, including calcified atherosclerotic plaque in the °left main, left anterior descending and right coronary arteries. °Thickening calcification of the aortic valve. °  °Mediastinum/Lymph Nodes: No pathologically enlarged mediastinal or °hilar lymph nodes. Esophagus is unremarkable in appearance. No °axillary lymphadenopathy. °  °Lungs/Pleura: No acute consolidative airspace disease. No pleural °effusions. No suspicious appearing pulmonary nodules or masses are °noted. °  °Musculoskeletal/Soft Tissues: There are no aggressive appearing °lytic or blastic lesions noted in the visualized portions of the °skeleton. °  °CTA ABDOMEN AND PELVIS FINDINGS °  °Hepatobiliary: No suspicious cystic or solid hepatic lesions. No °intra or extrahepatic biliary ductal dilatation. Status post °cholecystectomy. °  °Pancreas: No pancreatic mass. No pancreatic ductal dilatation. No °pancreatic or peripancreatic fluid or inflammatory changes. °  °Spleen: Unremarkable. °  °Adrenals/Urinary Tract: 5.2 cm low-attenuation lesion in the °interpolar region of the left kidney is compatible with a simple °cyst. Other subcentimeter low-attenuation lesions in both kidneys, °too small to characterize, but statistically likely to represent °cysts. Scarring at site of ruptured cyst in the lower pole of the °right kidney incidentally noted. No suspicious renal lesions. No °hydroureteronephrosis. Urinary bladder is normal in appearance. °Bilateral adrenal  glands are normal in appearance. °  °Stomach/Bowel: Normal appearance of the stomach. No pathologic °dilatation of small bowel or colon. Numerous colonic diverticulae °are noted, without surrounding inflammatory changes to suggest an °acute diverticulitis at this time. Normal appendix. °  °Vascular/Lymphatic: Aortic atherosclerosis, without evidence of °aneurysm or dissection in the abdominal or pelvic vasculature. No °lymphadenopathy noted in the abdomen or pelvis. °  °Reproductive: Uterus and ovaries are unremarkable in appearance. °  °Other: No significant volume of ascites.  No pneumoperitoneum. °  °Musculoskeletal: There are no aggressive appearing lytic or blastic °lesions noted in the visualized portions of the skeleton. °  °VASCULAR MEASUREMENTS PERTINENT TO TAVR: °  °AORTA: °  °Minimal Aortic Diameter-16 x 16 mm °  °Severity of Aortic Calcification-mild °  °RIGHT PELVIS: °  °Right Common Iliac Artery - °  °Minimal Diameter-8.8 x 6.9 mm °  °Tortuosity-mild °  °Calcification-moderate °  °Right External Iliac Artery - °  °Minimal Diameter-7.5 x 5.8 mm °  °Tortuosity-moderate °  °Calcification-moderate °  °Right Common Femoral Artery - °  °Minimal Diameter-7.2 x 6.0 mm °  °Tortuosity-mild °  °Calcification-moderate °  °LEFT PELVIS: °  °Left Common Iliac Artery - °  °Minimal Diameter-6.9 x 7.9 mm °  °Tortuosity-mild °  °Calcification-moderate °  °Left External Iliac Artery - °  °Minimal Diameter-7.2 x 6.2 mm °  °Tortuosity-moderate °  °Calcification-moderate °  °Left Common Femoral Artery - °  °Minimal Diameter-7.8 x 4.7 mm °  °Tortuosity-mild °  °Calcification-moderate °  °Review of the MIP images confirms the above findings. °  °IMPRESSION: °1. Vascular findings and measurements pertinent to potential TAVR °procedure, as detailed above. °2. Severe thickening calcification of the aortic valve, compatible °with the reported clinical history of severe aortic stenosis. °3. Aortic atherosclerosis, in addition to  left main and 2 vessel °coronary artery disease. °4. Colonic diverticulosis, without evidence of acute diverticulitis °at this time. °5. Additional incidental findings, as above. °  °  °Electronically Signed °  By: Daniel  Entrikin M.D. °  On: 03/31/2019 15:13 ° ° °STS Risk Calculator ° °Procedure: Isolated AVR  ° °Risk of Mortality: 3.624%  °Renal Failure: 7.131%  °Permanent Stroke: 1.894%  °Prolonged Ventilation: 12.611%  °DSW Infection: 0.128%  °Reoperation: 3.089%  °Morbidity or Mortality: 16.832%  °Short Length of Stay: 12.344%  °Long Length of Stay: 13.657%  ° ° ° ° °Impression: ° °  Patient has stage D severe symptomatic aortic stenosis.  She describes a gradual decline in exercise tolerance with increasing fatigue and some lower extremity edema, consistent with chronic diastolic congestive heart failure, New York Heart Association functional class II.  I personally reviewed the patient's most recent transthoracic echocardiogram, diagnostic cardiac catheterization, and CT angiograms.  Echocardiogram reveals severe aortic stenosis.  The aortic valve is trileaflet with severe thickening, calcification, and restricted leaflet mobility involving all 3 leaflets.  Peak velocity across aortic valve measured greater than 4.1 m/s corresponding to mean transvalvular gradient estimated 40 mmHg.  Left ventricular systolic function remains normal.  The DVI was quite low and reported 0.14.  Diagnostic cardiac catheterization confirmed the presence of severe aortic stenosis and revealed moderate nonobstructive coronary artery disease.  I agree the patient would benefit from elective aortic valve replacement.  There might also be some benefit with concomitant Maze procedure, but risks associated with conventional surgery would be at least moderately elevated because of the patient's advanced age.  Furthermore, the patient has relatively small sized aortic annulus and aortic root.  Cardiac-gated CTA of the heart reveals  anatomical characteristics consistent with aortic stenosis suitable for treatment by transcatheter aortic valve replacement without any significant complicating features and CTA of the aorta and iliac vessels demonstrate what appears to be adequate pelvic vascular access to facilitate a transfemoral approach.  I favor transcatheter aortic valve replacement is a far less invasive and likely less risky approach for treating this elderly patient.  Prior to surgery the patient will need to undergo dental service consultation and likely dental extraction because of complaints of a sore tooth that is "rotten" according to the patient. ° ° ° °Plan: ° °The patient was counseled at length regarding treatment alternatives for management of severe symptomatic aortic stenosis. Alternative approaches such as conventional aortic valve replacement, transcatheter aortic valve replacement, and continued medical therapy without intervention were compared and contrasted at length.  The risks associated with conventional surgical aortic valve replacement were discussed in detail, as were expectations for post-operative convalescence, and why I would be reluctant to consider this patient a candidate for conventional surgery.  Issues specific to transcatheter aortic valve replacement were discussed including questions about long term valve durability, the potential for paravalvular leak, possible increased risk of need for permanent pacemaker placement, and other technical complications related to the procedure itself.  Long-term prognosis with medical therapy was discussed. This discussion was placed in the context of the patient's own specific clinical presentation and past medical history.  All of their questions have been addressed.  The patient hopes to proceed with transcatheter aortic valve replacement as soon as practical.  As a neck step the patient will be referred for dental service consultation.  Once this has been  accomplished we can make final plans for surgical intervention. ° °Following the decision to proceed with transcatheter aortic valve replacement, a discussion has been held regarding what types of management strategies would be attempted intraoperatively in the event of life-threatening complications, including whether or not the patient would be considered a candidate for the use of cardiopulmonary bypass and/or conversion to open sternotomy for attempted surgical intervention.  The patient has been advised of a variety of complications that might develop including but not limited to risks of death, stroke, paravalvular leak, aortic dissection or other major vascular complications, aortic annulus rupture, device embolization, cardiac rupture or perforation, mitral regurgitation, acute myocardial infarction, arrhythmia, heart block or bradycardia requiring permanent pacemaker placement, congestive   heart failure, respiratory failure, renal failure, pneumonia, infection, other late complications related to structural valve deterioration or migration, or other complications that might ultimately cause a temporary or permanent loss of functional independence or other long term morbidity.  The patient provides full informed consent for the procedure as described and all questions were answered. ° ° ° °I spent in excess of 90 minutes during the conduct of this office consultation and >50% of this time involved direct face-to-face encounter with the patient for counseling and/or coordination of their care. ° ° ° ° °Aiyah Scarpelli H. Meiah Zamudio, MD °04/08/2019 °4:37 PM ° ° °

## 2019-04-08 NOTE — Patient Instructions (Addendum)
Stop taking Eliquis now and schedule appointment in [place] taking all other medications without change through the day before surgery.  Have nothing to eat or drink after midnight the night before surgery.  On the morning of surgery take only levothyroxine and amlodipine with a sip of water.

## 2019-04-09 ENCOUNTER — Other Ambulatory Visit: Payer: Self-pay

## 2019-04-09 DIAGNOSIS — I350 Nonrheumatic aortic (valve) stenosis: Secondary | ICD-10-CM

## 2019-04-10 ENCOUNTER — Ambulatory Visit (HOSPITAL_COMMUNITY): Payer: Self-pay | Admitting: Dentistry

## 2019-04-10 ENCOUNTER — Other Ambulatory Visit: Payer: Self-pay

## 2019-04-10 ENCOUNTER — Encounter (HOSPITAL_COMMUNITY): Payer: Self-pay | Admitting: Dentistry

## 2019-04-10 VITALS — BP 157/67 | HR 61 | Temp 98.5°F

## 2019-04-10 DIAGNOSIS — K029 Dental caries, unspecified: Secondary | ICD-10-CM

## 2019-04-10 DIAGNOSIS — K0401 Reversible pulpitis: Secondary | ICD-10-CM

## 2019-04-10 DIAGNOSIS — K045 Chronic apical periodontitis: Secondary | ICD-10-CM

## 2019-04-10 DIAGNOSIS — Z01818 Encounter for other preprocedural examination: Secondary | ICD-10-CM

## 2019-04-10 DIAGNOSIS — M2632 Excessive spacing of fully erupted teeth: Secondary | ICD-10-CM

## 2019-04-10 DIAGNOSIS — K08409 Partial loss of teeth, unspecified cause, unspecified class: Secondary | ICD-10-CM

## 2019-04-10 DIAGNOSIS — K031 Abrasion of teeth: Secondary | ICD-10-CM

## 2019-04-10 DIAGNOSIS — K0889 Other specified disorders of teeth and supporting structures: Secondary | ICD-10-CM

## 2019-04-10 DIAGNOSIS — I350 Nonrheumatic aortic (valve) stenosis: Secondary | ICD-10-CM

## 2019-04-10 DIAGNOSIS — K0530 Chronic periodontitis, unspecified: Secondary | ICD-10-CM

## 2019-04-10 DIAGNOSIS — M264 Malocclusion, unspecified: Secondary | ICD-10-CM

## 2019-04-10 DIAGNOSIS — K06010 Localized gingival recession, unspecified: Secondary | ICD-10-CM

## 2019-04-10 DIAGNOSIS — K036 Deposits [accretions] on teeth: Secondary | ICD-10-CM

## 2019-04-10 MED ORDER — AMOXICILLIN 500 MG PO CAPS: ORAL_CAPSULE | ORAL | 1 refills | Status: DC
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
## 2019-04-10 NOTE — Patient Instructions (Signed)
[facility]    Department of Dental Medicine     DR. KULINSKI      HEART VALVES AND MOUTH CARE:  FACTS:   If you have any infection in your mouth, it can infect your heart valve.  If you heart valve is infected, you will be seriously ill.  Infections in the mouth can be SILENT and do not always cause pain.  Examples of infections in the mouth are gum disease, dental cavities, and abscesses.  Some possible signs of infection are: Bad breath, bleeding gums, or teeth that are sensitive to sweets, hot, and/or cold. There are many other signs as well.  WHAT YOU HAVE TO DO:   Brush your teeth after meals and at bedtime. Spend at least 2 minutes brushing well, especially behind your back teeth and all around your teeth that stand alone. Brush at the gumline also.  Do not go to bed without brushing your teeth and flossing.  If you gums bleed when you brush or floss, do NOT stop brushing or flossing. It usually means that your gums need more attention and better cleaning.   If your Dentist or Dr. Kulinski gave you a prescription mouthwash to use, make sure to use it as directed. If you run out of the medication, get a refill at the pharmacy.   If you were given any other medications or directions by your Dentist, please follow them. If you did not understand the directions or forget what you were told, please call. We will be happy to refresh her memory.  If you need antibiotics before dental procedures, make sure you take them one hour prior to every dental visit as directed.   Get a dental checkup every 4-6 months in order to keep your mouth healthy, or to find and treat any new infection. You will most likely need your teeth cleaned or gums treated at the same time.  If you are not able to come in for your scheduled appointment, call your Dentist as soon as possible to reschedule.  If you have a problem in between dental visits, call your Dentist.  

## 2019-04-11 ENCOUNTER — Other Ambulatory Visit (HOSPITAL_COMMUNITY): Payer: Medicare Other

## 2019-04-11 ENCOUNTER — Inpatient Hospital Stay (HOSPITAL_COMMUNITY)
Admission: RE | Admit: 2019-04-11 | Discharge: 2019-04-11 | Disposition: A | Discharge status: Home / Self Care | Payer: Medicare Other | Source: Ambulatory Visit

## 2019-04-11 ENCOUNTER — Telehealth: Payer: Self-pay

## 2019-04-11 NOTE — Telephone Encounter (Signed)
I returned call to pt to clarify instructions on Eliquis.  Pt's TAVR was rescheduled from 6/23 to 6/30 due to need for dental extraction. The pt stopped Eliquis on 6/16 and I advised her that the TAVR team would like her to remain off of this medication until TAVR is performed 6/30. The pt will be advised when to resume.  The pt also asked about antibiotic that was prescribed by Dr Enrique Sack yesterday.  I made her aware that this was a prescription to take 1 hour prior to dental appointment today.  The pt questioned if she needed more antibiotic and I advised her that going forward she will need to take Amoxicillin prior to dental appointment for SBE prophylaxis.

## 2019-04-17 ENCOUNTER — Other Ambulatory Visit (HOSPITAL_COMMUNITY): Payer: Self-pay | Admitting: *Deleted

## 2019-04-17 NOTE — Pre-Procedure Instructions (Signed)
JAKKI DOUGHTY  04/17/2019     [facility] - [place] - [address] [address] [address] [place] [address] Phone: [phone] Fax: [phone]   Your procedure is scheduled on Tuesday, April 22, 2019.  Report to [facility] Admitting at 7:15 A.M.  Call this number if you have problems the morning of surgery:  [phone]   Remember: Brush your teeth the morning of surgery with your regular toothpaste.  Do not eat or drink after midnight Monday, April 21, 2019  Take these medicines the morning of surgery with A SIP OF WATER : amLODipine (NORVASC) and levothyroxine (SYNTHROID, LEVOTHROID)   Stop taking  vitamins, fish oil and herbal medications. Do not take any NSAIDs ie: Ibuprofen, Advil, Naproxen (Aleve), Motrin, BC and Goody Powder; stop now.      How to Manage Your Diabetes Before and After Surgery  Why is it important to control my blood sugar before and after surgery? . Improving blood sugar levels before and after surgery helps healing and can limit problems. . A way of improving blood sugar control is eating a healthy diet by: o  Eating less sugar and carbohydrates o  Increasing activity/exercise o  Talking with your doctor about reaching your blood sugar goals . High blood sugars (greater than 180 mg/dL) can raise your risk of infections and slow your recovery, so you will need to focus on controlling your diabetes during the weeks before surgery. . Make sure that the doctor who takes care of your diabetes knows about your planned surgery including the date and location.  How do I manage my blood sugar before surgery? . Check your blood sugar at least 4 times a day, starting 2 days before surgery, to make sure that the level is not too high or low. o Check your blood sugar the morning of your surgery when you wake up and every 2 hours until you get to the Short Stay unit. . If your blood sugar is less than  70 mg/dL, you will need to treat for low blood sugar: o Treat a low blood sugar (less than 70 mg/dL) with  cup of clear juice (cranberry or apple), 4 glucose tablets, OR glucose gel. Recheck blood sugar in 15 minutes after treatment (to make sure it is greater than 70 mg/dL). If your blood sugar is not greater than 70 mg/dL on recheck, call [phone] o  for further instructions. . Report your blood sugar to the short stay nurse when you get to Short Stay.  . If you are admitted to the hospital after surgery: o Your blood sugar will be checked by the staff and you will probably be given insulin after surgery (instead of oral diabetes medicines) to make sure you have good blood sugar levels. o The goal for blood sugar control after surgery is 80-180 mg/dL.   WHAT DO I DO ABOUT MY DIABETES MEDICATION? N/A  Reviewed and Endorsed by [facility] Patient Education Committee, August 2015  Do not wear jewelry, make-up or nail polish.  Do not wear lotions, powders, or perfumes, or deodorant.  Do not shave 48 hours prior to surgery.    Do not bring valuables to the hospital.  [facility] is not responsible for any belongings or valuables.  Contacts, dentures or bridgework may not be worn into surgery  Special instructions: Shower the night before and the morning of surgery with CHG.  Please read over the following  fact sheets that you were given. Pain Booklet, Coughing and Deep Breathing, MRSA Information and Surgical Site Infection Prevention

## 2019-04-18 ENCOUNTER — Encounter (HOSPITAL_COMMUNITY)
Admission: RE | Admit: 2019-04-18 | Discharge: 2019-04-18 | Disposition: A | Discharge status: Home / Self Care | Payer: Medicare Other | Source: Ambulatory Visit | Attending: Cardiovascular Disease | Admitting: Cardiovascular Disease

## 2019-04-18 ENCOUNTER — Encounter (HOSPITAL_COMMUNITY): Payer: Self-pay

## 2019-04-18 ENCOUNTER — Ambulatory Visit (HOSPITAL_COMMUNITY)
Admission: RE | Admit: 2019-04-18 | Discharge: 2019-04-18 | Disposition: A | Discharge status: Home / Self Care | Payer: Medicare Other | Source: Ambulatory Visit | Attending: Cardiovascular Disease | Admitting: Cardiovascular Disease

## 2019-04-18 ENCOUNTER — Other Ambulatory Visit: Payer: Self-pay

## 2019-04-18 ENCOUNTER — Other Ambulatory Visit (HOSPITAL_COMMUNITY)
Admission: RE | Admit: 2019-04-18 | Discharge: 2019-04-18 | Disposition: A | Discharge status: Home / Self Care | Payer: Medicare Other | Source: Ambulatory Visit | Attending: Cardiovascular Disease | Admitting: Cardiovascular Disease

## 2019-04-18 ENCOUNTER — Other Ambulatory Visit (HOSPITAL_COMMUNITY): Payer: Medicare Other

## 2019-04-18 DIAGNOSIS — I4819 Other persistent atrial fibrillation: Secondary | ICD-10-CM | POA: Diagnosis not present

## 2019-04-18 DIAGNOSIS — Z1159 Encounter for screening for other viral diseases: Secondary | ICD-10-CM | POA: Insufficient documentation

## 2019-04-18 DIAGNOSIS — Z01818 Encounter for other preprocedural examination: Secondary | ICD-10-CM | POA: Diagnosis present

## 2019-04-18 DIAGNOSIS — Z79890 Hormone replacement therapy (postmenopausal): Secondary | ICD-10-CM | POA: Insufficient documentation

## 2019-04-18 DIAGNOSIS — I350 Nonrheumatic aortic (valve) stenosis: Secondary | ICD-10-CM | POA: Insufficient documentation

## 2019-04-18 DIAGNOSIS — E039 Hypothyroidism, unspecified: Secondary | ICD-10-CM | POA: Insufficient documentation

## 2019-04-18 DIAGNOSIS — Z7901 Long term (current) use of anticoagulants: Secondary | ICD-10-CM | POA: Insufficient documentation

## 2019-04-18 DIAGNOSIS — E119 Type 2 diabetes mellitus without complications: Secondary | ICD-10-CM | POA: Insufficient documentation

## 2019-04-18 DIAGNOSIS — E785 Hyperlipidemia, unspecified: Secondary | ICD-10-CM | POA: Insufficient documentation

## 2019-04-18 DIAGNOSIS — Z79899 Other long term (current) drug therapy: Secondary | ICD-10-CM | POA: Insufficient documentation

## 2019-04-18 HISTORY — DX: Presence of spectacles and contact lenses: Z97.3

## 2019-04-18 LAB — COMPREHENSIVE METABOLIC PANEL
ALT: 17 U/L (ref 0–44)
AST: 23 U/L (ref 15–41)
Albumin: 4.4 g/dL (ref 3.5–5.0)
Alkaline Phosphatase: 42 U/L (ref 38–126)
Anion gap: 11 (ref 5–15)
BUN: 20 mg/dL (ref 8–23)
CO2: 21 mmol/L — ABNORMAL LOW (ref 22–32)
Calcium: 10.3 mg/dL (ref 8.9–10.3)
Chloride: 106 mmol/L (ref 98–111)
Creatinine, Ser: 1.41 mg/dL — ABNORMAL HIGH (ref 0.44–1.00)
GFR calc Af Amer: 39 mL/min — ABNORMAL LOW (ref >60)
GFR calc non Af Amer: 34 mL/min — ABNORMAL LOW (ref >60)
Glucose, Bld: 149 mg/dL — ABNORMAL HIGH (ref 70–99)
Potassium: 4.3 mmol/L (ref 3.5–5.1)
Sodium: 138 mmol/L (ref 135–145)
Total Bilirubin: 0.8 mg/dL (ref 0.3–1.2)
Total Protein: 8.2 g/dL — ABNORMAL HIGH (ref 6.5–8.1)

## 2019-04-18 LAB — URINALYSIS, ROUTINE W REFLEX MICROSCOPIC
Bilirubin Urine: NEGATIVE
Glucose, UA: NEGATIVE mg/dL
Hgb urine dipstick: NEGATIVE
Ketones, ur: NEGATIVE mg/dL
Nitrite: NEGATIVE
Protein, ur: 30 mg/dL — AB
Specific Gravity, Urine: 1.017 (ref 1.005–1.030)
pH: 6 (ref 5.0–8.0)

## 2019-04-18 LAB — CBC
HCT: 42.1 % (ref 36.0–46.0)
Hemoglobin: 13.5 g/dL (ref 12.0–15.0)
MCH: 29.2 pg (ref 26.0–34.0)
MCHC: 32.1 g/dL (ref 30.0–36.0)
MCV: 91.1 fL (ref 80.0–100.0)
Platelets: 240 10*3/uL (ref 150–400)
RBC: 4.62 MIL/uL (ref 3.87–5.11)
RDW: 13.2 % (ref 11.5–15.5)
WBC: 7 10*3/uL (ref 4.0–10.5)
nRBC: 0 % (ref 0.0–0.2)

## 2019-04-18 LAB — PROTIME-INR
INR: 1.1 (ref 0.8–1.2)
Prothrombin Time: 13.8 seconds (ref 11.4–15.2)

## 2019-04-18 LAB — BLOOD GAS, ARTERIAL
Acid-Base Excess: 0 mmol/L (ref 0.0–2.0)
Bicarbonate: 23.9 mmol/L (ref 20.0–28.0)
Drawn by: 42180
FIO2: 0.21
O2 Saturation: 98.1 %
Patient temperature: 98.6
pCO2 arterial: 37.4 mmHg (ref 32.0–48.0)
pH, Arterial: 7.423 (ref 7.350–7.450)
pO2, Arterial: 117 mmHg — ABNORMAL HIGH (ref 83.0–108.0)

## 2019-04-18 LAB — TYPE AND SCREEN
ABO/RH(D): O POS
Antibody Screen: NEGATIVE

## 2019-04-18 LAB — SURGICAL PCR SCREEN
MRSA, PCR: NEGATIVE
Staphylococcus aureus: NEGATIVE

## 2019-04-18 LAB — SARS CORONAVIRUS 2 (TAT 6-24 HRS): SARS Coronavirus 2: NEGATIVE

## 2019-04-18 LAB — APTT: aPTT: 34 seconds (ref 24–36)

## 2019-04-18 LAB — BRAIN NATRIURETIC PEPTIDE: B Natriuretic Peptide: 171.4 pg/mL — ABNORMAL HIGH (ref 0.0–100.0)

## 2019-04-18 LAB — ABO/RH: ABO/RH(D): O POS

## 2019-04-18 LAB — GLUCOSE, CAPILLARY: Glucose-Capillary: 122 mg/dL — ABNORMAL HIGH (ref 70–99)

## 2019-04-18 NOTE — Progress Notes (Signed)
Pt denies SOB and chest pain. Pt stated that she is under the care of Dr. Nicki Reaper, Cardiology. Pt stated that Dr. Arelia Sneddon is her PCP. Pt denies having a chest x ray within the last 2 weeks. Pt denies having a stress test. Pt stated that her last dose of Eliquis was 04/08/2019 as instructed. Pt stated that she was instructed to take her Norvasc and Synthroid the morning of surgery with a  sip of water. Pt denies that she and family members tested positive for COVID-19 ( pt scheduled to be tested today and reminded to quarantine).  Pt denies that she and family members experienced the following symptoms:  Cough yes/no: No Fever (>100.40F)  yes/no: No Runny nose yes/no: No Sore throat yes/no: No Difficulty breathing/shortness of breath  yes/no: No  Have you or a family member traveled in the last 14 days and where? yes/no: No  Pt reminded that hospital visitation restrictions are in effect and the importance of the restrictions.   Pt verbalized understanding of all pre-op instructions.  Pt chart forwarded to PA, Anesthesiology, for review.

## 2019-04-19 LAB — HEMOGLOBIN A1C
Hgb A1c MFr Bld: 7.2 % — ABNORMAL HIGH (ref 4.8–5.6)
Mean Plasma Glucose: 160 mg/dL

## 2019-04-21 ENCOUNTER — Other Ambulatory Visit: Payer: Self-pay | Admitting: *Deleted

## 2019-04-21 ENCOUNTER — Encounter (HOSPITAL_COMMUNITY): Payer: Self-pay | Admitting: Certified Registered Nurse Anesthetist

## 2019-04-21 MED ORDER — POTASSIUM CHLORIDE 2 MEQ/ML IV SOLN
80.0000 meq | INTRAVENOUS | Status: DC
  Filled 2019-04-21 (×2): qty 40

## 2019-04-21 MED ORDER — DEXMEDETOMIDINE HCL IN NACL 400 MCG/100ML IV SOLN
0.1000 ug/kg/h | INTRAVENOUS | Status: AC
  Administered 2019-04-22: 1 ug/kg/h via INTRAVENOUS
  Filled 2019-04-21 (×2): qty 100

## 2019-04-21 MED ORDER — SODIUM CHLORIDE 0.9 % IV SOLN
INTRAVENOUS | Status: DC
  Filled 2019-04-21 (×2): qty 30

## 2019-04-21 MED ORDER — VANCOMYCIN HCL 10 G IV SOLR
1500.0000 mg | INTRAVENOUS | Status: AC
  Administered 2019-04-22: 1500 mg via INTRAVENOUS
  Filled 2019-04-21 (×2): qty 1500

## 2019-04-21 MED ORDER — NOREPINEPHRINE 4 MG/250ML-% IV SOLN
0.0000 ug/min | INTRAVENOUS | Status: DC
  Filled 2019-04-21: qty 250

## 2019-04-21 MED ORDER — SODIUM CHLORIDE 0.9 % IV SOLN
1.5000 g | INTRAVENOUS | Status: AC
  Administered 2019-04-22: 1.5 g via INTRAVENOUS
  Filled 2019-04-21 (×2): qty 1.5

## 2019-04-21 MED ORDER — MAGNESIUM SULFATE 50 % IJ SOLN
40.0000 meq | INTRAMUSCULAR | Status: DC
  Filled 2019-04-21 (×2): qty 9.85

## 2019-04-21 NOTE — Anesthesia Preprocedure Evaluation (Addendum)
Anesthesia Evaluation  Patient identified by MRN, date of birth, ID band Patient awake    Reviewed: Allergy & Precautions, NPO status , Patient's Chart, lab work & pertinent test results  Airway Mallampati: III  TM Distance: >3 FB Neck ROM: Full    Dental no notable dental hx.    Pulmonary neg pulmonary ROS,    Pulmonary exam normal breath sounds clear to auscultation       Cardiovascular hypertension, Pt. on medications and Pt. on home beta blockers + Valvular Problems/Murmurs AS  Rhythm:Regular Rate:Normal + Systolic murmurs ECG: SB, rate 53  CATH: 1.  Mild nonobstructive coronary artery disease with 40 to 50% stenosis in the proximal RCA, diffuse irregularity of the LAD, and minor diffuse irregularity of the left circumflex 2.  Calcified, restricted aortic valve with mean transvalvular gradient 31 mmHg, peak to peak gradient 46 mmHg 3.  Normal right heart hemodynamics  ECHO:  1. The left ventricle has normal systolic function with an ejection fraction of 60-65%. The cavity size was normal. Left ventricular diastolic Doppler parameters are indeterminate.  2. The right ventricle has normal systolic function. The cavity was normal. There is no increase in right ventricular wall thickness. Right ventricular systolic pressure is mildly elevated with an estimated pressure of 39.4 mmHg.  3. Left atrial size was severely dilated.  4. Trivial pericardial effusion is present.  5. The mitral valve is normal in structure.  6. The tricuspid valve is normal in structure. Tricuspid valve regurgitation is moderate.  7. The aortic valve is tricuspid Severely thickening of the aortic valve Aortic valve regurgitation is trivial by color flow Doppler. severe stenosis of the aortic valve.  8. The pulmonic valve was normal in structure.  9. The aortic root is normal in size and structure. 10. Normal LV function; severe AS (peak velocity 4.1 m/s and  mean gradient 40 mmHg); trace AI; mild MR; severe LAE; moderate TR; mild pulmonary hypertension.    Neuro/Psych negative neurological ROS  negative psych ROS   GI/Hepatic negative GI ROS, Neg liver ROS,   Endo/Other  diabetesHypothyroidism   Renal/GU negative Renal ROS     Musculoskeletal negative musculoskeletal ROS (+)   Abdominal (+) + obese,   Peds  Hematology HLD   Anesthesia Other Findings Severe Aortic Stenosis  Reproductive/Obstetrics                           Anesthesia Physical Anesthesia Plan  ASA: IV  Anesthesia Plan: MAC   Post-op Pain Management:    Induction: Intravenous  PONV Risk Score and Plan: 2 and Treatment may vary due to age or medical condition  Airway Management Planned: Simple Face Mask  Additional Equipment: Arterial line  Intra-op Plan:   Post-operative Plan:   Informed Consent: I have reviewed the patients History and Physical, chart, labs and discussed the procedure including the risks, benefits and alternatives for the proposed anesthesia with the patient or authorized representative who has indicated his/her understanding and acceptance.     Dental advisory given  Plan Discussed with: CRNA  Anesthesia Plan Comments:       Anesthesia Quick Evaluation

## 2019-04-22 ENCOUNTER — Inpatient Hospital Stay (HOSPITAL_COMMUNITY)
Admission: RE | Admit: 2019-04-22 | Discharge: 2019-04-24 | DRG: 267 | Disposition: A | Discharge status: Home / Self Care | Payer: Medicare Other | Attending: Cardiovascular Disease | Admitting: Cardiovascular Disease

## 2019-04-22 ENCOUNTER — Other Ambulatory Visit: Payer: Self-pay

## 2019-04-22 ENCOUNTER — Ambulatory Visit (HOSPITAL_COMMUNITY)
Admission: RE | Admit: 2019-04-22 | Discharge: 2019-04-22 | Disposition: A | Discharge status: Home / Self Care | Payer: Medicare Other | Source: Ambulatory Visit | Attending: Cardiovascular Disease | Admitting: Cardiovascular Disease

## 2019-04-22 ENCOUNTER — Inpatient Hospital Stay (HOSPITAL_COMMUNITY): Payer: Medicare Other

## 2019-04-22 ENCOUNTER — Inpatient Hospital Stay (HOSPITAL_COMMUNITY): Payer: Medicare Other | Admitting: Physician Assistant

## 2019-04-22 ENCOUNTER — Inpatient Hospital Stay (HOSPITAL_COMMUNITY): Payer: Medicare Other | Admitting: Certified Registered Nurse Anesthetist

## 2019-04-22 ENCOUNTER — Encounter (HOSPITAL_COMMUNITY)
Admission: RE | Disposition: A | Discharge status: Home / Self Care | Payer: Self-pay | Source: Home / Self Care | Attending: Cardiovascular Disease

## 2019-04-22 ENCOUNTER — Encounter (HOSPITAL_COMMUNITY): Payer: Self-pay | Admitting: *Deleted

## 2019-04-22 DIAGNOSIS — Z6833 Body mass index (BMI) 33.0-33.9, adult: Secondary | ICD-10-CM | POA: Diagnosis not present

## 2019-04-22 DIAGNOSIS — E669 Obesity, unspecified: Secondary | ICD-10-CM | POA: Diagnosis not present

## 2019-04-22 DIAGNOSIS — I350 Nonrheumatic aortic (valve) stenosis: Principal | ICD-10-CM

## 2019-04-22 DIAGNOSIS — Z8249 Family history of ischemic heart disease and other diseases of the circulatory system: Secondary | ICD-10-CM

## 2019-04-22 DIAGNOSIS — E785 Hyperlipidemia, unspecified: Secondary | ICD-10-CM | POA: Diagnosis present

## 2019-04-22 DIAGNOSIS — Z79899 Other long term (current) drug therapy: Secondary | ICD-10-CM

## 2019-04-22 DIAGNOSIS — E039 Hypothyroidism, unspecified: Secondary | ICD-10-CM | POA: Diagnosis present

## 2019-04-22 DIAGNOSIS — Z006 Encounter for examination for normal comparison and control in clinical research program: Secondary | ICD-10-CM

## 2019-04-22 DIAGNOSIS — Z79890 Hormone replacement therapy (postmenopausal): Secondary | ICD-10-CM

## 2019-04-22 DIAGNOSIS — Z7901 Long term (current) use of anticoagulants: Secondary | ICD-10-CM | POA: Diagnosis not present

## 2019-04-22 DIAGNOSIS — I2510 Atherosclerotic heart disease of native coronary artery without angina pectoris: Secondary | ICD-10-CM | POA: Diagnosis present

## 2019-04-22 DIAGNOSIS — Z888 Allergy status to other drugs, medicaments and biological substances status: Secondary | ICD-10-CM

## 2019-04-22 DIAGNOSIS — I4819 Other persistent atrial fibrillation: Secondary | ICD-10-CM | POA: Diagnosis present

## 2019-04-22 DIAGNOSIS — E119 Type 2 diabetes mellitus without complications: Secondary | ICD-10-CM | POA: Diagnosis not present

## 2019-04-22 DIAGNOSIS — Z952 Presence of prosthetic heart valve: Secondary | ICD-10-CM

## 2019-04-22 DIAGNOSIS — I5032 Chronic diastolic (congestive) heart failure: Secondary | ICD-10-CM | POA: Diagnosis present

## 2019-04-22 DIAGNOSIS — I493 Ventricular premature depolarization: Secondary | ICD-10-CM | POA: Diagnosis not present

## 2019-04-22 DIAGNOSIS — I110 Hypertensive heart disease with heart failure: Secondary | ICD-10-CM | POA: Diagnosis not present

## 2019-04-22 HISTORY — DX: Presence of prosthetic heart valve: Z95.2

## 2019-04-22 HISTORY — PX: TEE WITHOUT CARDIOVERSION: SHX5443

## 2019-04-22 HISTORY — PX: TRANSCATHETER AORTIC VALVE REPLACEMENT, TRANSFEMORAL: SHX6400

## 2019-04-22 LAB — GLUCOSE, CAPILLARY
Glucose-Capillary: 150 mg/dL — ABNORMAL HIGH (ref 70–99)
Glucose-Capillary: 146 mg/dL — ABNORMAL HIGH (ref 70–99)
Glucose-Capillary: 127 mg/dL — ABNORMAL HIGH (ref 70–99)
Glucose-Capillary: 172 mg/dL — ABNORMAL HIGH (ref 70–99)

## 2019-04-22 LAB — POCT ACTIVATED CLOTTING TIME
Activated Clotting Time: 121 seconds
Activated Clotting Time: 325 seconds

## 2019-04-22 LAB — POCT I-STAT, CHEM 8
BUN: 26 mg/dL — ABNORMAL HIGH (ref 8–23)
Calcium, Ion: 1.22 mmol/L (ref 1.15–1.40)
Chloride: 107 mmol/L (ref 98–111)
Creatinine, Ser: 1.3 mg/dL — ABNORMAL HIGH (ref 0.44–1.00)
Glucose, Bld: 159 mg/dL — ABNORMAL HIGH (ref 70–99)
HCT: 33 % — ABNORMAL LOW (ref 36.0–46.0)
Hemoglobin: 11.2 g/dL — ABNORMAL LOW (ref 12.0–15.0)
Potassium: 4.5 mmol/L (ref 3.5–5.1)
Sodium: 139 mmol/L (ref 135–145)
TCO2: 23 mmol/L (ref 22–32)

## 2019-04-22 LAB — POCT I-STAT 4, (NA,K, GLUC, HGB,HCT)
Glucose, Bld: 150 mg/dL — ABNORMAL HIGH (ref 70–99)
Glucose, Bld: 163 mg/dL — ABNORMAL HIGH (ref 70–99)
HCT: 35 % — ABNORMAL LOW (ref 36.0–46.0)
HCT: 32 % — ABNORMAL LOW (ref 36.0–46.0)
Hemoglobin: 11.9 g/dL — ABNORMAL LOW (ref 12.0–15.0)
Hemoglobin: 10.9 g/dL — ABNORMAL LOW (ref 12.0–15.0)
Potassium: 4.4 mmol/L (ref 3.5–5.1)
Potassium: 4.7 mmol/L (ref 3.5–5.1)
Sodium: 141 mmol/L (ref 135–145)
Sodium: 140 mmol/L (ref 135–145)

## 2019-04-22 SURGERY — IMPLANTATION, AORTIC VALVE, TRANSCATHETER, FEMORAL APPROACH
Anesthesia: Monitor Anesthesia Care

## 2019-04-22 MED ORDER — PROTAMINE SULFATE 10 MG/ML IV SOLN
INTRAVENOUS | Status: DC | PRN
  Administered 2019-04-22: 140 mg via INTRAVENOUS

## 2019-04-22 MED ORDER — SPIRONOLACTONE 12.5 MG HALF TABLET
12.5000 mg | ORAL_TABLET | Freq: Every day | ORAL | Status: DC
  Administered 2019-04-23 – 2019-04-24 (×2): 12.5 mg via ORAL
  Filled 2019-04-22 (×2): qty 1

## 2019-04-22 MED ORDER — OXYCODONE HCL 5 MG PO TABS: 5.0000 mg | ORAL_TABLET | ORAL | Status: DC | PRN

## 2019-04-22 MED ORDER — HEPARIN (PORCINE) IN NACL 1000-0.9 UT/500ML-% IV SOLN
INTRAVENOUS | Status: AC
  Filled 2019-04-22: qty 1500

## 2019-04-22 MED ORDER — CHLORHEXIDINE GLUCONATE 4 % EX LIQD: 60.0000 mL | Freq: Once | CUTANEOUS | Status: DC

## 2019-04-22 MED ORDER — METOPROLOL TARTRATE 5 MG/5ML IV SOLN: 2.5000 mg | INTRAVENOUS | Status: DC | PRN

## 2019-04-22 MED ORDER — LOSARTAN POTASSIUM 50 MG PO TABS
50.0000 mg | ORAL_TABLET | Freq: Every day | ORAL | Status: DC
  Administered 2019-04-23 – 2019-04-24 (×2): 50 mg via ORAL
  Filled 2019-04-22 (×2): qty 1

## 2019-04-22 MED ORDER — FENTANYL CITRATE (PF) 100 MCG/2ML IJ SOLN
INTRAMUSCULAR | Status: DC | PRN
  Administered 2019-04-22: 25 ug via INTRAVENOUS
  Administered 2019-04-22: 50 ug via INTRAVENOUS
  Administered 2019-04-22: 25 ug via INTRAVENOUS

## 2019-04-22 MED ORDER — SODIUM CHLORIDE 0.9 % IV SOLN
INTRAVENOUS | Status: AC
  Administered 2019-04-22: 18:00:00 via INTRAVENOUS

## 2019-04-22 MED ORDER — ONDANSETRON HCL 4 MG/2ML IJ SOLN: 4.0000 mg | Freq: Four times a day (QID) | INTRAMUSCULAR | Status: DC | PRN

## 2019-04-22 MED ORDER — DOPAMINE-DEXTROSE 3.2-5 MG/ML-% IV SOLN
INTRAVENOUS | Status: AC
  Filled 2019-04-22: qty 250

## 2019-04-22 MED ORDER — HEPARIN (PORCINE) IN NACL 1000-0.9 UT/500ML-% IV SOLN
INTRAVENOUS | Status: DC | PRN
  Administered 2019-04-22 (×3): 500 mL

## 2019-04-22 MED ORDER — SODIUM CHLORIDE 0.9 % IV SOLN
250.0000 mL | INTRAVENOUS | Status: DC | PRN
  Administered 2019-04-23: 250 mL via INTRAVENOUS

## 2019-04-22 MED ORDER — SODIUM CHLORIDE 0.9 % IV SOLN
INTRAVENOUS | Status: DC
  Administered 2019-04-22 (×2): 1000 mL via INTRAVENOUS

## 2019-04-22 MED ORDER — NITROGLYCERIN IN D5W 200-5 MCG/ML-% IV SOLN: 0.0000 ug/min | INTRAVENOUS | Status: DC

## 2019-04-22 MED ORDER — SODIUM CHLORIDE 0.9% FLUSH
3.0000 mL | Freq: Two times a day (BID) | INTRAVENOUS | Status: DC
  Administered 2019-04-22 – 2019-04-24 (×4): 3 mL via INTRAVENOUS

## 2019-04-22 MED ORDER — PHENYLEPHRINE HCL-NACL 20-0.9 MG/250ML-% IV SOLN: 0.0000 ug/min | INTRAVENOUS | Status: DC

## 2019-04-22 MED ORDER — SODIUM CHLORIDE 0.9% FLUSH: 3.0000 mL | INTRAVENOUS | Status: DC | PRN

## 2019-04-22 MED ORDER — DEXMEDETOMIDINE HCL IN NACL 200 MCG/50ML IV SOLN
INTRAVENOUS | Status: DC | PRN
  Administered 2019-04-22: 89.92 ug via INTRAVENOUS

## 2019-04-22 MED ORDER — IOHEXOL 350 MG/ML SOLN
INTRAVENOUS | Status: DC | PRN
  Administered 2019-04-22: 30 mL via INTRA_ARTERIAL

## 2019-04-22 MED ORDER — ASPIRIN 81 MG PO CHEW
81.0000 mg | CHEWABLE_TABLET | Freq: Every day | ORAL | Status: DC
  Administered 2019-04-23 – 2019-04-24 (×2): 81 mg via ORAL
  Filled 2019-04-22 (×2): qty 1

## 2019-04-22 MED ORDER — LIDOCAINE HCL (PF) 1 % IJ SOLN
INTRAMUSCULAR | Status: DC | PRN
  Administered 2019-04-22 (×2): 15 mL via INTRADERMAL

## 2019-04-22 MED ORDER — DOPAMINE-DEXTROSE 3.2-5 MG/ML-% IV SOLN
0.0000 ug/kg/min | Freq: Once | INTRAVENOUS | Status: AC
  Administered 2019-04-22: 3 ug/kg/min via INTRAVENOUS

## 2019-04-22 MED ORDER — MORPHINE SULFATE (PF) 2 MG/ML IV SOLN: 1.0000 mg | INTRAVENOUS | Status: DC | PRN

## 2019-04-22 MED ORDER — HYDROCHLOROTHIAZIDE 25 MG PO TABS
12.5000 mg | ORAL_TABLET | Freq: Every day | ORAL | Status: DC
  Administered 2019-04-23 – 2019-04-24 (×2): 12.5 mg via ORAL
  Filled 2019-04-22 (×2): qty 1

## 2019-04-22 MED ORDER — HEPARIN SODIUM (PORCINE) 1000 UNIT/ML IJ SOLN
INTRAMUSCULAR | Status: DC | PRN
  Administered 2019-04-22: 14000 [IU] via INTRAVENOUS

## 2019-04-22 MED ORDER — CLOPIDOGREL BISULFATE 75 MG PO TABS
75.0000 mg | ORAL_TABLET | Freq: Every day | ORAL | Status: DC
  Administered 2019-04-23: 75 mg via ORAL
  Filled 2019-04-22: qty 1

## 2019-04-22 MED ORDER — PROPOFOL 500 MG/50ML IV EMUL
INTRAVENOUS | Status: DC | PRN
  Administered 2019-04-22: 10 ug/kg/min via INTRAVENOUS

## 2019-04-22 MED ORDER — ACETAMINOPHEN 500 MG PO TABS
1000.0000 mg | ORAL_TABLET | Freq: Once | ORAL | Status: AC
  Administered 2019-04-22: 08:00:00 1000 mg via ORAL
  Filled 2019-04-22: qty 2

## 2019-04-22 MED ORDER — ACETAMINOPHEN 500 MG PO TABS: 500.0000 mg | ORAL_TABLET | Freq: Four times a day (QID) | ORAL | Status: DC | PRN

## 2019-04-22 MED ORDER — VANCOMYCIN HCL IN DEXTROSE 1-5 GM/200ML-% IV SOLN
1000.0000 mg | Freq: Once | INTRAVENOUS | Status: AC
  Administered 2019-04-22: 1000 mg via INTRAVENOUS
  Filled 2019-04-22: qty 200

## 2019-04-22 MED ORDER — LIDOCAINE HCL (PF) 1 % IJ SOLN
INTRAMUSCULAR | Status: AC
  Filled 2019-04-22: qty 30

## 2019-04-22 MED ORDER — DOPAMINE-DEXTROSE 3.2-5 MG/ML-% IV SOLN
0.0000 ug/kg/min | INTRAVENOUS | Status: DC
  Administered 2019-04-22: 3 ug/kg/min via INTRAVENOUS

## 2019-04-22 MED ORDER — LEVOTHYROXINE SODIUM 75 MCG PO TABS
75.0000 ug | ORAL_TABLET | Freq: Every day | ORAL | Status: DC
  Administered 2019-04-23 – 2019-04-24 (×2): 75 ug via ORAL
  Filled 2019-04-22 (×2): qty 1

## 2019-04-22 MED ORDER — ONDANSETRON HCL 4 MG/2ML IJ SOLN
INTRAMUSCULAR | Status: DC | PRN
  Administered 2019-04-22: 4 mg via INTRAVENOUS

## 2019-04-22 MED ORDER — SODIUM CHLORIDE 0.9 % IV SOLN
1.5000 g | Freq: Two times a day (BID) | INTRAVENOUS | Status: AC
  Administered 2019-04-22 – 2019-04-24 (×4): 1.5 g via INTRAVENOUS
  Filled 2019-04-22 (×6): qty 1.5

## 2019-04-22 MED ORDER — ACETAMINOPHEN 500 MG PO TABS
ORAL_TABLET | ORAL | Status: AC
  Administered 2019-04-22: 1000 mg via ORAL
  Filled 2019-04-22: qty 2

## 2019-04-22 MED ORDER — FENTANYL CITRATE (PF) 100 MCG/2ML IJ SOLN
INTRAMUSCULAR | Status: AC
  Filled 2019-04-22: qty 2

## 2019-04-22 MED ORDER — ROSUVASTATIN CALCIUM 5 MG PO TABS
10.0000 mg | ORAL_TABLET | ORAL | Status: DC
  Administered 2019-04-24: 10 mg via ORAL
  Filled 2019-04-22: qty 2

## 2019-04-22 MED ORDER — AMLODIPINE BESYLATE 10 MG PO TABS
10.0000 mg | ORAL_TABLET | Freq: Every day | ORAL | Status: DC
  Administered 2019-04-23 – 2019-04-24 (×2): 10 mg via ORAL
  Filled 2019-04-22 (×2): qty 1

## 2019-04-22 MED ORDER — CHLORHEXIDINE GLUCONATE 0.12 % MT SOLN
15.0000 mL | Freq: Once | OROMUCOSAL | Status: AC
  Administered 2019-04-22: 15 mL via OROMUCOSAL
  Filled 2019-04-22: qty 15

## 2019-04-22 MED ORDER — METOPROLOL TARTRATE 25 MG PO TABS: 25.0000 mg | ORAL_TABLET | Freq: Two times a day (BID) | ORAL | Status: DC

## 2019-04-22 MED ORDER — CHLORHEXIDINE GLUCONATE 4 % EX LIQD: 30.0000 mL | CUTANEOUS | Status: DC

## 2019-04-22 MED ORDER — SODIUM CHLORIDE 0.9 % IV SOLN
INTRAVENOUS | Status: DC | PRN
  Administered 2019-04-22: 08:00:00 via INTRAVENOUS

## 2019-04-22 MED ORDER — TRAMADOL HCL 50 MG PO TABS: 50.0000 mg | ORAL_TABLET | ORAL | Status: DC | PRN

## 2019-04-22 SURGICAL SUPPLY — 34 items
BAG SNAP BAND KOVER 36X36 (MISCELLANEOUS) ×6 IMPLANT
BLANKET WARM UNDERBOD FULL ACC (MISCELLANEOUS) ×3 IMPLANT
CABLE ADAPT PACING TEMP 12FT (ADAPTER) ×2 IMPLANT
CATH 23 EDWARDS DELIVERY SYS (CATHETERS) ×2 IMPLANT
CATH DIAG 6FR PIGTAIL ANGLED (CATHETERS) ×4 IMPLANT
CATH INFINITI 6F AL1 (CATHETERS) ×2 IMPLANT
CATH S G BIP PACING (CATHETERS) ×2 IMPLANT
CLOSURE MYNX CONTROL 6F/7F (Vascular Products) ×2 IMPLANT
CRIMPER (MISCELLANEOUS) ×2 IMPLANT
DEVICE CLOSURE PERCLS PRGLD 6F (VASCULAR PRODUCTS) IMPLANT
DEVICE INFLATION ATRION QL2530 (MISCELLANEOUS) ×2 IMPLANT
GUIDEWIRE SAFE TJ AMPLATZ EXST (WIRE) ×2 IMPLANT
KIT HEART LEFT (KITS) ×3 IMPLANT
KIT MICROPUNCTURE NIT STIFF (SHEATH) ×4 IMPLANT
PACK CARDIAC CATHETERIZATION (CUSTOM PROCEDURE TRAY) ×3 IMPLANT
PERCLOSE PROGLIDE 6F (VASCULAR PRODUCTS) ×12
SHEATH 14X36 EDWARDS (SHEATH) ×2 IMPLANT
SHEATH BRITE TIP 7FR 35CM (SHEATH) ×2 IMPLANT
SHEATH PINNACLE 6F 10CM (SHEATH) ×2 IMPLANT
SHEATH PINNACLE 8F 10CM (SHEATH) ×2 IMPLANT
SHEATH PROBE COVER 6X72 (BAG) ×2 IMPLANT
SHIELD RADPAD SCOOP 12X17 (MISCELLANEOUS) ×2 IMPLANT
SLEEVE REPOSITIONING LENGTH 30 (MISCELLANEOUS) ×2 IMPLANT
STOPCOCK MORSE 400PSI 3WAY (MISCELLANEOUS) ×6 IMPLANT
TRANSDUCER W/STOPCOCK (MISCELLANEOUS) ×6 IMPLANT
TUBE CONN 8.8X1320 FR HP M-F (CONNECTOR) ×2 IMPLANT
TUBING ART PRESS 72  MALE/FEM (TUBING) ×4
TUBING ART PRESS 72 MALE/FEM (TUBING) IMPLANT
VALVE HEART TRANSCATH SZ3 23MM (Valve) ×2 IMPLANT
WIRE AMPLATZ SS-J .035X180CM (WIRE) ×2 IMPLANT
WIRE EMERALD 3MM-J .025X260CM (WIRE) ×2 IMPLANT
WIRE EMERALD 3MM-J .035X150CM (WIRE) ×2 IMPLANT
WIRE EMERALD 3MM-J .035X260CM (WIRE) ×2 IMPLANT
WIRE EMERALD ST .035X260CM (WIRE) ×2 IMPLANT

## 2019-04-22 NOTE — Progress Notes (Signed)
  Echocardiogram 2D Echocardiogram has been performed.  [name] 04/22/2019, 11:16 AM

## 2019-04-22 NOTE — Interval H&P Note (Signed)
History and Physical Interval Note:  04/22/2019 8:00 AM  [name]  has presented today for surgery, with the diagnosis of Severe Aortic Stenosis.  The various methods of treatment have been discussed with the patient and family. After consideration of risks, benefits and other options for treatment, the patient has consented to  Procedure(s): TRANSCATHETER AORTIC VALVE REPLACEMENT, TRANSFEMORAL (N/A) TRANSESOPHAGEAL ECHOCARDIOGRAM (TEE) (N/A) as a surgical intervention.  The patient's history has been reviewed, patient examined, no change in status, stable for surgery.  I have reviewed the patient's chart and labs.  Questions were answered to the patient's satisfaction.     Rexene Alberts

## 2019-04-22 NOTE — Progress Notes (Signed)
Patient ID: [name], female   DOB: 03/07/1931, 85 y.o.   MRN: [id number] TCTS Evening Rounds:  Hemodynamically stable in sinus rhythm 46. On dopamine 3 mcg.  Awake and alert  Groin sites ok.

## 2019-04-22 NOTE — Anesthesia Procedure Notes (Signed)
Arterial Line Insertion Start/End6/30/2020 8:00 AM Performed by: Candis Shine, CRNA, CRNA  Patient location: Pre-op. Preanesthetic checklist: patient identified, IV checked, site marked, risks and benefits discussed, surgical consent, monitors and equipment checked, pre-op evaluation, timeout performed and anesthesia consent Lidocaine 1% used for infiltration Right, radial was placed Catheter size: 20 G Hand hygiene performed  and maximum sterile barriers used   Attempts: 1 Procedure performed without using ultrasound guided technique. Following insertion, dressing applied and Biopatch. Post procedure assessment: normal and unchanged  Patient tolerated the procedure well with no immediate complications.

## 2019-04-22 NOTE — Op Note (Signed)
[facility]   MULTIDISCIPLINARY HEART VALVE TEAM   TAVR OPERATIVE NOTE   Date of Procedure:  04/22/2019  Preoperative Diagnosis: Severe Aortic Stenosis   Postoperative Diagnosis: Same   Procedure:    Transcatheter Aortic Valve Replacement - Percutaneous Right Transfemoral Approach  Edwards Sapien 3 THV (size 23 mm, model # 9600TFX, serial # [id number])   Co-Surgeons:  Salvatore Decentlarence H. Cornelius Moraswen, MD and Tonny BollmanMichael Cooper, MD  Anesthesiologist:  Karna Christmasyan Ellender, MD  Echocardiographer:  Thurmon FairMihai Croitoru, MD  Pre-operative Echo Findings:  Severe aortic stenosis  Normal left ventricular systolic function  Post-operative Echo Findings:  No paravalvular leak  Normal left ventricular systolic function   BRIEF CLINICAL NOTE AND INDICATIONS FOR SURGERY  Patient is an 85 year old widowed [name] with history of aortic stenosis, hypertension, PAF on long-term anticoagulation, type 2 diabetes mellitus, and hyperlipidemia who has been referred for surgical consultation to discuss treatment options for management of severe symptomatic aortic stenosis.  Patient has been relatively healthy, physically active, and functionally independent for all of her adult life.  She has been treated for essential hypertension and type 2 diabetes mellitus for which she is currently diet controlled.  She was noted to have a heart murmur on physical exam many years ago and was diagnosed with aortic stenosis.  For the last several years she has been followed by Dr. Excell Seltzerooper with serial echocardiograms.  Approximately 3 years ago she was noted to be in atrial fibrillation and started on long-term anticoagulation using Eliquis.  Most recent twelve-lead EKG revealed normal sinus rhythm.  Over the past year the patient has developed decreased exercise tolerance with worsening fatigue.  Follow-up transthoracic echocardiogram performed December 16, 2018 revealed significant progression and severity of  aortic stenosis with peak velocity across aortic valve measured greater than 4.1 m/s corresponding to mean transvalvular gradient estimated 40 mmHg.  The DVI was notably quite low at 0.14 and aortic valve area calculated only 0.35 cm.  Left ventricular systolic function remain preserved.  Left and right heart catheterization was performed March 24, 2019 and confirmed the presence of severe aortic stenosis with peak to peak and mean transvalvular gradients measured 46 and 31 mmHg, respectively.  There was mild nonobstructive coronary artery disease with 40 to 50% stenosis of the proximal right coronary artery, diffuse irregularity of the left anterior descending coronary artery and minor irregularities in the left circumflex system.  CT angiography was performed and the patient has been referred for surgical consultation.  During the course of the patient's preoperative work up they have been evaluated comprehensively by a multidisciplinary team of specialists coordinated through [facility].  They have been demonstrated to suffer from symptomatic severe aortic stenosis as noted above. The patient has been counseled extensively as to the relative risks and benefits of all options for the treatment of severe aortic stenosis including long term medical therapy, conventional surgery for aortic valve replacement, and transcatheter aortic valve replacement.  All questions have been answered, and the patient provides full informed consent for the operation as described.   DETAILS OF THE OPERATIVE PROCEDURE  PREPARATION:    The patient is brought to the operating room on the above mentioned date and appropriate monitoring was established by the anesthesia team. The patient is placed in the supine position on the operating table.  Intravenous antibiotics are administered. The patient is monitored closely throughout the procedure under conscious  sedation  Baseline transthoracic  echocardiogram was performed. The patient's chest, abdomen, both groins, and both lower extremities are prepared and draped in a sterile manner. A time out procedure is performed.   PERIPHERAL ACCESS:    Using the modified Seldinger technique, femoral arterial and venous access was obtained with placement of 6 Fr sheaths on the left side.  A pigtail diagnostic catheter was passed through the left arterial sheath under fluoroscopic guidance into the aortic root.  A temporary transvenous pacemaker catheter was passed through the left femoral venous sheath under fluoroscopic guidance into the right ventricle.  The pacemaker was tested to ensure stable lead placement and pacemaker capture. Aortic root angiography was performed in order to determine the optimal angiographic angle for valve deployment.   TRANSFEMORAL ACCESS:   Percutaneous transfemoral access and sheath placement was performed using ultrasound guidance.  The right common femoral artery was cannulated using a micropuncture needle and appropriate location was verified using hand injection angiogram.  A pair of Abbott Perclose percutaneous closure devices were placed and a 6 French sheath replaced into the femoral artery.  The patient was heparinized systemically and ACT verified > 250 seconds.    A 14 Fr transfemoral E-sheath was introduced into the right common femoral artery after progressively dilating over an Amplatz superstiff wire. An AL-1 catheter was used to direct a straight-tip exchange length wire across the native aortic valve into the left ventricle. This was exchanged out for a pigtail catheter and position was confirmed in the LV apex. Simultaneous LV and Ao pressures were recorded.  The pigtail catheter was exchanged for an Amplatz Extra-stiff wire in the LV apex.  Echocardiography was utilized to confirm appropriate wire position and no sign of entanglement in the mitral subvalvular  apparatus.   TRANSCATHETER HEART VALVE DEPLOYMENT:   An Edwards Sapien 3 transcatheter heart valve (size 23 mm, model #9600TFX, serial #[id number]) was prepared and crimped per manufacturer's guidelines, and the proper orientation of the valve is confirmed on the [employer] Commander delivery system. The valve was advanced through the introducer sheath using normal technique until in an appropriate position in the abdominal aorta beyond the sheath tip. The balloon was then retracted and using the fine-tuning wheel was centered on the valve. The valve was then advanced across the aortic arch using appropriate flexion of the catheter. The valve was carefully positioned across the aortic valve annulus. The Commander catheter was retracted using normal technique. Once final position of the valve has been confirmed by angiographic assessment, the valve is deployed while temporarily holding ventilation and during rapid ventricular pacing to maintain systolic blood pressure < 50 mmHg and pulse pressure < 10 mmHg. The balloon inflation is held for >3 seconds after reaching full deployment volume. Once the balloon has fully deflated the balloon is retracted into the ascending aorta and valve function is assessed using echocardiography. There is felt to be no paravalvular leak and no central aortic insufficiency.  The patient's hemodynamic recovery following valve deployment is good.  The deployment balloon and guidewire are both removed.    PROCEDURE COMPLETION:   The sheath was removed and femoral artery closure performed.  Protamine was administered once femoral arterial repair was complete. The temporary pacemaker, pigtail catheters and femoral sheaths were removed with manual pressure used for hemostasis.    The patient tolerated the procedure well and is transported to the surgical intensive care in stable condition. There were no immediate intraoperative complications. All sponge instrument and needle counts are  verified correct at completion  of the operation.   No blood products were administered during the operation.  The patient received a total of 30 mL of intravenous contrast during the procedure.   Rexene Alberts, MD 04/22/2019 11:26 AM

## 2019-04-22 NOTE — Anesthesia Procedure Notes (Signed)
Procedure Name: MAC Date/Time: 04/22/2019 9:25 AM Performed by: Candis Shine, CRNA Pre-anesthesia Checklist: Patient identified, Emergency Drugs available, Suction available and Patient being monitored Patient Re-evaluated:Patient Re-evaluated prior to induction Oxygen Delivery Method: Simple face mask Dental Injury: Teeth and Oropharynx as per pre-operative assessment

## 2019-04-22 NOTE — Progress Notes (Addendum)
Structural Heart Note: Patient seen in the Cath Lab recovery area.  She is waking up slowly after conscious sedation for TAVR.  I was called to see her for bradycardia.  She is in sinus rhythm but is very slow with a heart rate in the 30s.  She occasionally drops a beat that I think is related to AV Wenkebach.  Blood pressure is in the 90s.  She otherwise has no complaints of chest pain or shortness of breath.  Her groin sites are stable.  We will start her on a low-dose of dopamine and plan to observe her overnight into heart.  We will follow-up again in a few hours. Family updated.   [name] 04/22/2019 2:08 PM

## 2019-04-22 NOTE — Anesthesia Procedure Notes (Signed)
Arterial Line Insertion Start/End6/30/2020 9:43 AM Performed by: Shirlyn Goltz, CRNA, CRNA  Patient location: Pre-op. Preanesthetic checklist: patient identified, IV checked, site marked, risks and benefits discussed, surgical consent, monitors and equipment checked, pre-op evaluation, timeout performed and anesthesia consent Lidocaine 1% used for infiltration Right, radial was placed Catheter size: 20 Fr Hand hygiene performed  and maximum sterile barriers used   Attempts: 1 Procedure performed without using ultrasound guided technique. Following insertion, dressing applied and Biopatch. Post procedure assessment: normal and unchanged  Patient tolerated the procedure well with no immediate complications.

## 2019-04-22 NOTE — Anesthesia Postprocedure Evaluation (Signed)
Anesthesia Post Note  Patient: RUMAYSA SABATINO  Procedure(s) Performed: TRANSCATHETER AORTIC VALVE REPLACEMENT, TRANSFEMORAL (N/A ) TRANSESOPHAGEAL ECHOCARDIOGRAM (TEE) (N/A )     Patient location during evaluation: Cath Lab Anesthesia Type: MAC Level of consciousness: awake Pain management: pain level controlled Vital Signs Assessment: post-procedure vital signs reviewed and stable Respiratory status: spontaneous breathing, nonlabored ventilation, respiratory function stable and patient connected to nasal cannula oxygen Cardiovascular status: stable and blood pressure returned to baseline Postop Assessment: no apparent nausea or vomiting Anesthetic complications: no    Last Vitals:  Vitals:   04/22/19 1525 04/22/19 1540  BP: (!) 106/30 (!) 101/33  Pulse: (!) 37 (!) 37  Resp: 11 12  Temp:    SpO2: 96% 95%    Last Pain:  Vitals:   04/22/19 1135  TempSrc: Temporal  PainSc: Asleep                 Ryan P Ellender

## 2019-04-22 NOTE — Progress Notes (Signed)
Dr Burt Knack in to check on patient.  Dopamine started for HR.

## 2019-04-22 NOTE — Transfer of Care (Signed)
Immediate Anesthesia Transfer of Care Note  Patient: LAVENE PENAGOS  Procedure(s) Performed: TRANSCATHETER AORTIC VALVE REPLACEMENT, TRANSFEMORAL (N/A ) TRANSESOPHAGEAL ECHOCARDIOGRAM (TEE) (N/A )  Patient Location: Cath Lab  Anesthesia Type:MAC  Level of Consciousness: awake, alert  and oriented  Airway & Oxygen Therapy: Patient Spontanous Breathing and Patient connected to face mask oxygen  Post-op Assessment: Report given to RN and Post -op Vital signs reviewed and stable  Post vital signs: Reviewed and stable  Last Vitals:  Vitals Value Taken Time  BP 104/42 04/22/19 1142  Temp    Pulse 43 04/22/19 1144  Resp 12 04/22/19 1144  SpO2 96 % 04/22/19 1144  Vitals shown include unvalidated device data.  Last Pain:  Vitals:   04/22/19 1135  TempSrc: Temporal  PainSc: Asleep      Patients Stated Pain Goal: 2 (67/34/19 3790)  Complications: No apparent anesthesia complications

## 2019-04-22 NOTE — Op Note (Signed)
[facility]   MULTIDISCIPLINARY HEART VALVE TEAM   TAVR OPERATIVE NOTE   Date of Procedure:  04/22/2019  Preoperative Diagnosis: Severe Aortic Stenosis   Postoperative Diagnosis: Same   Procedure:    Transcatheter Aortic Valve Replacement - Percutaneous Transfemoral Approach  Edwards Sapien 3 THV (size 23 mm, model # 9600TFX, serial # [id number])   Co-Surgeons:  Salvatore Decentlarence H. Cornelius Moraswen, MD and Tonny BollmanMichael Shundra Wirsing, MD  Anesthesiologist:  Karna Christmasyan Ellender, MD  Echocardiographer:  Thurmon FairMihai Croitoru, MD  Pre-operative Echo Findings:  Severe aortic stenosis  Normal left ventricular systolic function  Post-operative Echo Findings:  No paravalvular leak  Normal left ventricular systolic function  BRIEF CLINICAL NOTE AND INDICATIONS FOR SURGERY  85 yo woman with progressive, now severe, symptomatic aortic stenosis, Stage D1 disease. She has NYHA II sx's of chronic diastolic CHF.  During the course of the patient's preoperative work up they have been evaluated comprehensively by a multidisciplinary team of specialists coordinated through the [facility].  They have been demonstrated to suffer from symptomatic severe aortic stenosis as noted above. The patient has been counseled extensively as to the relative risks and benefits of all options for the treatment of severe aortic stenosis including long term medical therapy, conventional surgery for aortic valve replacement, and transcatheter aortic valve replacement.  The patient has been independently evaluated by Dr. Cornelius Moraswen and  they are felt to be at moderate risk for conventional surgical aortic valve replacement. Based upon review of all of the patient's preoperative diagnostic tests they are felt to be candidate for transcatheter aortic valve replacement using the transfemoral approach as an alternative to high risk conventional surgery.    Following the decision to  proceed with transcatheter aortic valve replacement, a discussion has been held regarding what types of management strategies would be attempted intraoperatively in the event of life-threatening complications, including whether or not the patient would be considered a candidate for the use of cardiopulmonary bypass and/or conversion to open sternotomy for attempted surgical intervention.  The patient has been advised of a variety of complications that might develop peculiar to this approach including but not limited to risks of death, stroke, paravalvular leak, aortic dissection or other major vascular complications, aortic annulus rupture, device embolization, cardiac rupture or perforation, acute myocardial infarction, arrhythmia, heart block or bradycardia requiring permanent pacemaker placement, congestive heart failure, respiratory failure, renal failure, pneumonia, infection, other late complications related to structural valve deterioration or migration, or other complications that might ultimately cause a temporary or permanent loss of functional independence or other long term morbidity.  The patient provides full informed consent for the procedure as described and all questions were answered preoperatively.  DETAILS OF THE OPERATIVE PROCEDURE  PREPARATION:   The patient is brought to the operating room on the above mentioned date and central monitoring was established by the anesthesia team including placement of a central venous catheter and radial arterial line. The patient is placed in the supine position on the operating table.  Intravenous antibiotics are administered. The patient is monitored closely throughout the procedure under conscious sedation.    Baseline transthoracic echocardiogram is performed. The patient's chest, abdomen, both groins, and both lower extremities are prepared and draped in a sterile manner. A time out procedure is performed.   PERIPHERAL ACCESS:   Using  ultrasound guidance, femoral arterial and venous access is obtained with placement of 6 Fr sheaths on the left side.  A pigtail diagnostic catheter was passed through the femoral arterial sheath under fluoroscopic guidance into the aortic root.  A temporary transvenous pacemaker catheter was passed through the femoral venous sheath under fluoroscopic guidance into the right ventricle.  The pacemaker was tested to ensure stable lead placement and pacemaker capture. Aortic root angiography was performed in order to determine the optimal angiographic angle for valve deployment.  TRANSFEMORAL ACCESS:  A micropuncture technique is used to access the right femoral artery under fluoroscopic and ultrasound guidance.  2 Perclose devices are deployed at 10' and 2' positions to 'PreClose' the femoral artery. An 8 French sheath is placed and then an Amplatz Superstiff wire is advanced through the sheath. This is changed out for a 14 French transfemoral E-Sheath after progressively dilating over the Superstiff wire.  An AL-2 catheter was used to direct a straight-tip exchange length wire across the native aortic valve into the left ventricle. This was exchanged out for a pigtail catheter and position was confirmed in the LV apex. Simultaneous LV and Ao pressures were recorded.  The pigtail catheter was exchanged for an Amplatz Extra-stiff wire in the LV apex.  Echocardiography was utilized to confirm appropriate wire position and no sign of entanglement in the mitral subvalvular apparatus.  BALLOON AORTIC VALVULOPLASTY:  Not performed  TRANSCATHETER HEART VALVE DEPLOYMENT:  An Edwards Sapien 3 transcatheter heart valve (size 23 mm) was prepared and crimped per manufacturer's guidelines, and the proper orientation of the valve is confirmed on the [employer] delivery system. The valve was advanced through the introducer sheath using normal technique until in an appropriate position in the abdominal aorta beyond the  sheath tip. The balloon was then retracted and using the fine-tuning wheel was centered on the valve. The valve was then advanced across the aortic arch using appropriate flexion of the catheter. The valve was carefully positioned across the aortic valve annulus. The Commander catheter was retracted using normal technique. Once final position of the valve has been confirmed by angiographic assessment, the valve is deployed while temporarily holding ventilation and during rapid ventricular pacing to maintain systolic blood pressure < 50 mmHg and pulse pressure < 10 mmHg. The balloon inflation is held for >3 seconds after reaching full deployment volume. Once the balloon has fully deflated the balloon is retracted into the ascending aorta and valve function is assessed using echocardiography. There is felt to be no paravalvular leak and no central aortic insufficiency.  The patient's hemodynamic recovery following valve deployment is good.  The deployment balloon and guidewire are both removed. Echo demostrated acceptable post-procedural gradients, stable mitral valve function, and no aortic insufficiency.   PROCEDURE COMPLETION:  The sheath was removed and femoral artery closure is performed using the 2 previously deployed Perclose devices.  Protamine is administered once femoral arterial repair was complete. The site is clear with no evidence of bleeding or hematoma after the sutures are tightened. The temporary pacemaker, pigtail catheters and femoral sheaths were removed with manual pressure used for hemostasis. A Mynx device is used for femoral hemostasis on the left side.  The patient tolerated the procedure well and is transported to the surgical intensive care in stable condition. There were no immediate intraoperative complications. All sponge instrument and needle counts are verified correct at completion of the operation.   The patient received a total of 40 mL of intravenous contrast during the  procedure.   Sherren Mocha, MD 04/22/2019 11:39 AM

## 2019-04-23 ENCOUNTER — Encounter (HOSPITAL_COMMUNITY): Payer: Self-pay | Admitting: Cardiovascular Disease

## 2019-04-23 ENCOUNTER — Other Ambulatory Visit: Payer: Self-pay

## 2019-04-23 ENCOUNTER — Inpatient Hospital Stay (HOSPITAL_COMMUNITY): Payer: Medicare Other

## 2019-04-23 DIAGNOSIS — Z952 Presence of prosthetic heart valve: Secondary | ICD-10-CM

## 2019-04-23 DIAGNOSIS — I350 Nonrheumatic aortic (valve) stenosis: Principal | ICD-10-CM

## 2019-04-23 LAB — BASIC METABOLIC PANEL
Anion gap: 9 (ref 5–15)
BUN: 21 mg/dL (ref 8–23)
CO2: 22 mmol/L (ref 22–32)
Calcium: 9 mg/dL (ref 8.9–10.3)
Chloride: 106 mmol/L (ref 98–111)
Creatinine, Ser: 1.15 mg/dL — ABNORMAL HIGH (ref 0.44–1.00)
GFR calc Af Amer: 50 mL/min — ABNORMAL LOW (ref >60)
GFR calc non Af Amer: 43 mL/min — ABNORMAL LOW (ref >60)
Glucose, Bld: 134 mg/dL — ABNORMAL HIGH (ref 70–99)
Potassium: 4.3 mmol/L (ref 3.5–5.1)
Sodium: 137 mmol/L (ref 135–145)

## 2019-04-23 LAB — CBC
HCT: 38.5 % (ref 36.0–46.0)
Hemoglobin: 12.7 g/dL (ref 12.0–15.0)
MCH: 29.6 pg (ref 26.0–34.0)
MCHC: 33 g/dL (ref 30.0–36.0)
MCV: 89.7 fL (ref 80.0–100.0)
Platelets: 183 10*3/uL (ref 150–400)
RBC: 4.29 MIL/uL (ref 3.87–5.11)
RDW: 12.8 % (ref 11.5–15.5)
WBC: 7.7 10*3/uL (ref 4.0–10.5)
nRBC: 0 % (ref 0.0–0.2)

## 2019-04-23 LAB — ECHOCARDIOGRAM COMPLETE
Height: 64 in
Weight: 3172.79 oz

## 2019-04-23 LAB — MAGNESIUM: Magnesium: 1.7 mg/dL (ref 1.7–2.4)

## 2019-04-23 LAB — GLUCOSE, CAPILLARY
Glucose-Capillary: 112 mg/dL — ABNORMAL HIGH (ref 70–99)
Glucose-Capillary: 66 mg/dL — ABNORMAL LOW (ref 70–99)

## 2019-04-23 MED ORDER — APIXABAN 5 MG PO TABS
5.0000 mg | ORAL_TABLET | Freq: Two times a day (BID) | ORAL | Status: DC
  Administered 2019-04-23 – 2019-04-24 (×2): 5 mg via ORAL
  Filled 2019-04-23 (×2): qty 1

## 2019-04-23 MED ORDER — CHLORHEXIDINE GLUCONATE CLOTH 2 % EX PADS
6.0000 | MEDICATED_PAD | Freq: Every day | CUTANEOUS | Status: DC
  Administered 2019-04-23: 6 via TOPICAL

## 2019-04-23 NOTE — Progress Notes (Signed)
Progress Note  Patient Name: [name] Date of Encounter: 04/23/2019  Primary Cardiologist: Tonny BollmanMichael Katerine Morua, MD   Subjective   Doing well this morning.  No chest pain or shortness of breath.  Had some difficulty with urination yesterday but this is resolved.  Now sitting up in the chair at the bedside.  Inpatient Medications    Scheduled Meds: . amLODipine  10 mg Oral Daily  . aspirin  81 mg Oral Daily  . Chlorhexidine Gluconate Cloth  6 each Topical Daily  . clopidogrel  75 mg Oral Q breakfast  . hydrochlorothiazide  12.5 mg Oral Daily  . levothyroxine  75 mcg Oral Daily  . losartan  50 mg Oral Daily  . metoprolol tartrate  25 mg Oral BID  . rosuvastatin  10 mg Oral QODAY  . sodium chloride flush  3 mL Intravenous Q12H  . spironolactone  12.5 mg Oral Daily   Continuous Infusions: . sodium chloride 10 mL/hr at 04/23/19 0900  . cefUROXime (ZINACEF)  IV Stopped (04/23/19 0502)  . DOPamine Stopped (04/23/19 0841)  . nitroGLYCERIN    . phenylephrine (NEO-SYNEPHRINE) Adult infusion     PRN Meds: sodium chloride, acetaminophen, metoprolol tartrate, morphine injection, ondansetron (ZOFRAN) IV, oxyCODONE, sodium chloride flush, traMADol   Vital Signs    Vitals:   04/23/19 0825 04/23/19 0830 04/23/19 0835 04/23/19 0845  BP:   (!) 137/58   Pulse:  66 (!) 57 61  Resp:  (!) 22 17 20   Temp: 97.9 F (36.6 C)     TempSrc: Oral     SpO2:  100% 99% 100%  Weight:      Height:        Intake/Output Summary (Last 24 hours) at 04/23/2019 0901 Last data filed at 04/23/2019 0900 Gross per 24 hour  Intake 2085.75 ml  Output 1125 ml  Net 960.75 ml   Last 3 Weights 04/22/2019 04/18/2019 04/08/2019  Weight (lbs) 198 lb 4.8 oz 198 lb 4.8 oz 198 lb  Weight (kg) 89.948 kg 89.948 kg 89.812 kg      Telemetry    Sinus bradycardia with occasional PVCs heart rate currently 59 bpm - Personally Reviewed  ECG    Sinus bradycardia 54 bpm, otherwise within normal limits - Personally  Reviewed  Physical Exam  Alert, oriented, elderly woman in no distress GEN: No acute distress.   Neck: No JVD Cardiac: RRR, soft 1/6 systolic ejection murmur at the right upper sternal border Respiratory: Clear to auscultation bilaterally. GI: Soft, nontender, non-distended  MS: No edema; No deformity.  Bilateral groin sites clear Neuro:  Nonfocal  Psych: Normal affect   Labs    High Sensitivity Troponin:  No results for input(s): TROPONINIHS in the last 720 hours.    Cardiac EnzymesNo results for input(s): TROPONINI in the last 168 hours. No results for input(s): TROPIPOC in the last 168 hours.   Chemistry Recent Labs  Lab 04/18/19 0947  04/22/19 1116 04/22/19 1154 04/23/19 0433  NA 138   < > 140 139 137  K 4.3   < > 4.7 4.5 4.3  CL 106  --   --  107 106  CO2 21*  --   --   --  22  GLUCOSE 149*   < > 163* 159* 134*  BUN 20  --   --  26* 21  CREATININE 1.41*  --   --  1.30* 1.15*  CALCIUM 10.3  --   --   --  9.0  PROT 8.2*  --   --   --   --   ALBUMIN 4.4  --   --   --   --   AST 23  --   --   --   --   ALT 17  --   --   --   --   ALKPHOS 42  --   --   --   --   BILITOT 0.8  --   --   --   --   GFRNONAA 34*  --   --   --  43*  GFRAA 39*  --   --   --  50*  ANIONGAP 11  --   --   --  9   < > = values in this interval not displayed.     Hematology Recent Labs  Lab 04/18/19 0947  04/22/19 1116 04/22/19 1154 04/23/19 0433  WBC 7.0  --   --   --  7.7  RBC 4.62  --   --   --  4.29  HGB 13.5   < > 10.9* 11.2* 12.7  HCT 42.1   < > 32.0* 33.0* 38.5  MCV 91.1  --   --   --  89.7  MCH 29.2  --   --   --  29.6  MCHC 32.1  --   --   --  33.0  RDW 13.2  --   --   --  12.8  PLT 240  --   --   --  183   < > = values in this interval not displayed.    BNP Recent Labs  Lab 04/18/19 0946  BNP 171.4*     DDimer No results for input(s): DDIMER in the last 168 hours.   Radiology    Dg Chest Port 1 View  Result Date: 04/22/2019 CLINICAL DATA:  Status post TAVR  EXAM: PORTABLE CHEST 1 VIEW COMPARISON:  04/18/2019 FINDINGS: The aortic valve appears well position. No complicating features are demonstrated. The heart is mildly enlarged but stable. Stable tortuosity and calcification of the thoracic aorta. The lungs are clear. No pulmonary edema or pleural effusions. Minimal left basilar atelectasis. IMPRESSION: Stable cardiac enlargement and streaky left basilar atelectasis but no edema, infiltrates or effusions. Electronically Signed   By: Marijo Sanes M.D.   On: 04/22/2019 17:44    Cardiac Studies   Postoperative day #1 echo study is pending  Patient Profile     85 y.o. female with severe symptomatic aortic stenosis, type 2 diabetes, and paroxysmal atrial fibrillation on chronic oral anticoagulation, presenting 04/22/2019 for TAVR  Assessment & Plan    1.  Severe, stage D1 aortic stenosis: Patient now postoperative day #1 from TAVR.  She is progressing well.  We will start her back on apixaban today.  Continue aspirin 81 mg daily x3 months.  Transfer to telemetry today.  Anticipate hospital discharge tomorrow.  Review postoperative day #1 echocardiogram when it is available. 2.  Paroxysmal atrial fibrillation: Maintaining sinus rhythm.  Resume apixaban tonight.  Hold metoprolol today in light of bradycardia. 3.  Hypertension: Continue amlodipine, hydrochlorothiazide, Spironolactone, and losartan.  Disposition: Transfer to 4 E. today.  As long as she remains stable would anticipate hospital discharge tomorrow.  For questions or updates, please contact [place] Please consult [email or web address] for contact info under        Signed, Sherren Mocha, MD  04/23/2019, 9:01 AM

## 2019-04-23 NOTE — Plan of Care (Signed)

## 2019-04-23 NOTE — Progress Notes (Signed)
CARDIAC REHAB PHASE I   PRE:  Rate/Rhythm: 64 SR    BP: sitting 139/48    SaO2: 98 RA  MODE:  Ambulation: 190 ft   POST:  Rate/Rhythm: 90 SR    BP: sitting 163/61     SaO2: 98 RA  Pt to BR then ambulated in hall with light gait belt assist. No major c/o, slow, steady pace. To recliner. VSS. Will f/u am to walk once more. [place], ACSM 04/23/2019 2:10 PM

## 2019-04-23 NOTE — Plan of Care (Signed)
  Problem: Education: Goal: Knowledge of General Education information will improve Description: Including pain rating scale, medication(s)/side effects and non-pharmacologic comfort measures Outcome: Progressing   Problem: Clinical Measurements: Goal: Ability to maintain clinical measurements within normal limits will improve Outcome: Progressing Goal: Cardiovascular complication will be avoided Outcome: Progressing   Problem: Elimination: Goal: Will not experience complications related to urinary retention Outcome: Not Progressing

## 2019-04-23 NOTE — Progress Notes (Signed)
Pt not able to urinate but has the urge. Bladder scan revealed amount >900. In & Cath attempted by this nurse and 2 other nurses. MD paged for unsuccessful attempts. Order to call urology if pt still not able to urinate. Pt had 1 incontinent episode and void times 2 with pure wick. Will continue to monitor pt closely this shift.

## 2019-04-24 LAB — BASIC METABOLIC PANEL
Anion gap: 10 (ref 5–15)
BUN: 18 mg/dL (ref 8–23)
CO2: 23 mmol/L (ref 22–32)
Calcium: 9.4 mg/dL (ref 8.9–10.3)
Chloride: 103 mmol/L (ref 98–111)
Creatinine, Ser: 1.27 mg/dL — ABNORMAL HIGH (ref 0.44–1.00)
GFR calc Af Amer: 45 mL/min — ABNORMAL LOW (ref >60)
GFR calc non Af Amer: 38 mL/min — ABNORMAL LOW (ref >60)
Glucose, Bld: 141 mg/dL — ABNORMAL HIGH (ref 70–99)
Potassium: 4.4 mmol/L (ref 3.5–5.1)
Sodium: 136 mmol/L (ref 135–145)

## 2019-04-24 MED ORDER — ASPIRIN 81 MG PO CHEW: 81.0000 mg | CHEWABLE_TABLET | Freq: Every day | ORAL | 3 refills | Status: DC

## 2019-04-24 NOTE — Progress Notes (Signed)
CARDIAC REHAB PHASE I   PRE:  Rate/Rhythm: 56 SR  BP:  Sitting: 143/59      SaO2: 98 RA  MODE:  Ambulation: 470 ft   POST:  Rate/Rhythm: 92 SR with PVCs  BP:  Sitting: 156/54    SaO2: 99 RA   Pt ambulated 456ft in hallway standby assist with mostly steady gait. Pt with slight loss of balance at times, but able to correct herself. Pt helped to BR, then returned to recliner. Pt educated on site care, encouraged ambulation as able with emphasis on safety. Pt declining CRP II at this time. Pt hopeful for d/c today.  [id number] Rufina Falco, RN BSN 04/24/2019 9:52 AM

## 2019-04-24 NOTE — Discharge Summary (Signed)
Discharge Summary    Patient ID: [name],  MRN: [id number], DOB/AGE: [id number] 83 y.o.  Admit date: 04/22/2019 Discharge date: 04/24/2019  Primary Care Provider: Kaleen MaskElkins, Wilson Oliver Primary Cardiologist: Dr. Excell Seltzerooper   Discharge Diagnoses    Principal Problem:   S/P TAVR (transcatheter aortic valve replacement) Active Problems:   Aortic stenosis   Type II diabetes mellitus (HCC)   Persistent atrial fibrillation   Severe aortic stenosis   Allergies Allergies  Allergen Reactions   Lisinopril     Angioneurotic edema    Diagnostic Studies/Procedures    TAVR: 04/22/19  Procedure:        Transcatheter Aortic Valve Replacement - Percutaneous Transfemoral Approach             Edwards Sapien 3 THV (size 23 mm, model # 9600TFX, serial # [id number])              Co-Surgeons:                        Salvatore Decentlarence H. Cornelius Moraswen, MD and Tonny BollmanMichael Cooper, MD  Anesthesiologist:                  Karna Christmasyan Ellender, MD  Echocardiographer:              Thurmon FairMihai Croitoru, MD  Pre-operative Echo Findings: ? Severe aortic stenosis ? Normal left ventricular systolic function  Post-operative Echo Findings: ? No paravalvular leak ? Normal left ventricular systolic function  _____________   History of Present Illness     85 yo woman with PMH of HTN, PAF, DM, and HL with progressive, now severe, symptomatic aortic stenosis, Stage D1 disease. She has NYHA II sx's of chronic diastolic CHF.  Patient had been relatively healthy, physically active, and functionally independent for all of her adult life. She has been treated for essential hypertension and type 2 diabetes mellitus for which she is currently diet controlled. She was noted to have a heart murmur on physical exam many years ago and was diagnosed with aortic stenosis. For the last several years she has been followed by Dr. Excell Seltzerooper with serial echocardiograms. Approximately 3 years ago she was noted to be in atrial fibrillation  and started on long-term anticoagulation using Eliquis. Most recent twelve-lead EKG revealed normal sinus rhythm. Over the past year the patient has developed decreased exercise tolerance with worsening fatigue. Follow-up transthoracic echocardiogram performed December 16, 2018 revealed significant progression and severity of aortic stenosis with peak velocity across aortic valve measured greater than 4.1 m/s corresponding to mean transvalvular gradient estimated 40 mmHg. The DVI was notably quite low at 0.14 and aortic valve area calculated only 0.35 cm. Left ventricular systolic function remain preserved. Left and right heart catheterization was performed March 24, 2019 and confirmed the presence of severe aortic stenosis with peak to peak and mean transvalvular gradients measured 46 and 31 mmHg, respectively. There was mild nonobstructive coronary artery disease with 40 to 50% stenosis of the proximal right coronary artery, diffuse irregularity of the left anterior descending coronary artery and minor irregularities in the left circumflex system. CT angiography was performed and the patient has been referred for surgical consultation. She was deemed to be appropriate for TAVR.    Hospital Course     Consultants: none  Underwent successful TAVR with Dr. Excell Seltzerooper and Dr. Cornelius Moraswen on 04/22/19 with  Stephannie PetersEdwards Sapien 3 THV (size 23 mm, model # 9600TFX, serial # [id number]). Relatively uncomplicated post op  course. Did have episodes of bradycardia and therefore her BB was stopped. Plan for ASA/Eliquis. She was continued on amlodipine, HCTZ, spiro and losartan. Echo showed stable valve placement with trivial peri-valvular leak.    [name] was seen by Dr. Excell Seltzerooper and determined stable for discharge home. Follow up in the office has been arranged. Medications are listed below.   _____________  Discharge Vitals Blood pressure (!) 156/54, pulse (!) 55, temperature 98 F (36.7 C), temperature source  Oral, resp. rate 17, height 5\' 4"  (1.626 m), weight 89.7 kg, SpO2 97 %.  Filed Weights   04/22/19 0743 04/24/19 0619  Weight: 89.9 kg 89.7 kg    Labs & Radiologic Studies    CBC Recent Labs    04/22/19 1154 04/23/19 0433  WBC  --  7.7  HGB 11.2* 12.7  HCT 33.0* 38.5  MCV  --  89.7  PLT  --  183   Basic Metabolic Panel Recent Labs    16/07/9606/01/20 0433 04/24/19 0252  NA 137 136  K 4.3 4.4  CL 106 103  CO2 22 23  GLUCOSE 134* 141*  BUN 21 18  CREATININE 1.15* 1.27*  CALCIUM 9.0 9.4  MG 1.7  --    Liver Function Tests No results for input(s): AST, ALT, ALKPHOS, BILITOT, PROT, ALBUMIN in the last 72 hours. No results for input(s): LIPASE, AMYLASE in the last 72 hours. Cardiac Enzymes No results for input(s): CKTOTAL, CKMB, CKMBINDEX, TROPONINI in the last 72 hours. BNP Invalid input(s): POCBNP D-Dimer No results for input(s): DDIMER in the last 72 hours. Hemoglobin A1C No results for input(s): HGBA1C in the last 72 hours. Fasting Lipid Panel No results for input(s): CHOL, HDL, LDLCALC, TRIG, CHOLHDL, LDLDIRECT in the last 72 hours. Thyroid Function Tests No results for input(s): TSH, T4TOTAL, T3FREE, THYROIDAB in the last 72 hours.  Invalid input(s): FREET3 _____________  Dg Chest 2 View  Result Date: 04/18/2019 CLINICAL DATA:  85 year old female with upcoming TAVR EXAM: CHEST - 2 VIEW COMPARISON:  Chest x-ray 04/03/2016, CT 03/31/2019 FINDINGS: Cardiomediastinal silhouette unchanged in size and contour. Calcification of the aortic arch. No pneumothorax or pleural effusion. No confluent airspace disease. Similar appearance of coarsened interstitial markings. Mild degenerative changes of the spine.  No displaced fracture. IMPRESSION: Chronic lung changes without evidence of acute cardiopulmonary disease. Electronically Signed   By: Gilmer MorJaime  Wagner D.O.   On: 04/18/2019 14:38   Ct Coronary Morph W/cta Cor W/score W/ca W/cm &/or Wo/cm  Addendum Date: 03/31/2019   ADDENDUM  REPORT: 03/31/2019 14:00 EXAM: OVER-READ INTERPRETATION  CT CHEST The following report is an over-read performed by radiologist Dr. Royal Piedraaniel Entrikinof [facility] on 03/31/2019. This over-read does not include interpretation of cardiac or coronary anatomy or pathology. The coronary calcium score and cardiac CTA interpretation by the cardiologist is attached. COMPARISON:  None. FINDINGS: Extracardiac findings will be described separately under dictation for contemporaneously obtained CTA chest, abdomen and pelvis. IMPRESSION: Please see separate dictation for contemporaneously obtained CTA chest, abdomen and pelvis 03/31/2019 for full description of relevant extracardiac findings. Electronically Signed   By: Trudie Reedaniel  Entrikin M.D.   On: 03/31/2019 14:00   Result Date: 03/31/2019 CLINICAL DATA:  85 year old female with severe aortic stenosis being evaluated for a TAVR procedure. EXAM: Cardiac TAVR CT TECHNIQUE: The patient was scanned on a [employer] scanner. A 120 kV retrospective scan was triggered in the descending thoracic aorta at 111 HU's. Gantry rotation speed was 250 msecs and collimation was .6  mm. No beta blockade or nitro were given. The 3D data set was reconstructed in 5% intervals of the R-R cycle. Systolic and diastolic phases were analyzed on a dedicated work station using MPR, MIP and VRT modes. The patient received 80 cc of contrast. FINDINGS: Aortic Valve: Trileaflet aortic valve with severely thickened and calcified leaflets and minimal calcifications extending into the LVOT. Aorta: Normal size with mild diffuse atherosclerotic plaque and calcifications and no dissection. Sinotubular Junction: 27 x 26 mm Ascending Thoracic Aorta: 33 x 32 mm Aortic Arch: 26 x 26 mm Descending Thoracic Aorta: 23 x 23 mm Sinus of Valsalva Measurements: Non-coronary:  29 mm Right -coronary:  31 mm Left -coronary:  32 mm Coronary Artery Height above Annulus: Left Main: 9.6 mm Right Coronary: 11.7 mm Virtual  Basal Annulus Measurements: Maximum/Minimum Diameter: 25.6 x 21.1 mm Mean Diameter: 22.5 mm Perimeter: 72.9 mm Area: 396 mm2 Optimum Fluoroscopic Angle for Delivery:  LAO 6 CAU 8. IMPRESSION: 1. Trileaflet aortic valve with severely thickened and calcified leaflets and minimal calcifications extending into the LVOT. Annular measurements are suitable for delivery of a 23 mm Edwards-SAPIEN 3 valve. 2. Aortic valve calcium score is 2316 and consistent with severe aortic stenosis. 3. Sufficient coronary to annulus distance. 4. Optimum Fluoroscopic Angle for Delivery: LAO 6 CAU 8. 5. No thrombus in the left atrial appendage. 6. Moderately dilated pulmonary artery consistent with pulmonary hypertension. Electronically Signed: By: Ena Dawley On: 03/31/2019 13:03   Dg Chest Port 1 View  Result Date: 04/22/2019 CLINICAL DATA:  Status post TAVR EXAM: PORTABLE CHEST 1 VIEW COMPARISON:  04/18/2019 FINDINGS: The aortic valve appears well position. No complicating features are demonstrated. The heart is mildly enlarged but stable. Stable tortuosity and calcification of the thoracic aorta. The lungs are clear. No pulmonary edema or pleural effusions. Minimal left basilar atelectasis. IMPRESSION: Stable cardiac enlargement and streaky left basilar atelectasis but no edema, infiltrates or effusions. Electronically Signed   By: Marijo Sanes M.D.   On: 04/22/2019 17:44   Ct Angio Chest Aorta W &/or Wo Contrast  Result Date: 03/31/2019 CLINICAL DATA:  85 year old female with history of severe aortic stenosis. Preprocedural study prior to potential transcatheter aortic valve replacement (TAVR) procedure. EXAM: CT ANGIOGRAPHY CHEST, ABDOMEN AND PELVIS TECHNIQUE: Multidetector CT imaging through the chest, abdomen and pelvis was performed using the standard protocol during bolus administration of intravenous contrast. Multiplanar reconstructed images and MIPs were obtained and reviewed to evaluate the vascular anatomy.  CONTRAST:  113mL OMNIPAQUE IOHEXOL 350 MG/ML SOLN COMPARISON:  CT the chest, abdomen and pelvis 04/03/2016. FINDINGS: CTA CHEST FINDINGS Cardiovascular: Heart size is mildly enlarged with left atrial dilatation. There is no significant pericardial fluid, thickening or pericardial calcification. There is aortic atherosclerosis, as well as atherosclerosis of the great vessels of the mediastinum and the coronary arteries, including calcified atherosclerotic plaque in the left main, left anterior descending and right coronary arteries. Thickening calcification of the aortic valve. Mediastinum/Lymph Nodes: No pathologically enlarged mediastinal or hilar lymph nodes. Esophagus is unremarkable in appearance. No axillary lymphadenopathy. Lungs/Pleura: No acute consolidative airspace disease. No pleural effusions. No suspicious appearing pulmonary nodules or masses are noted. Musculoskeletal/Soft Tissues: There are no aggressive appearing lytic or blastic lesions noted in the visualized portions of the skeleton. CTA ABDOMEN AND PELVIS FINDINGS Hepatobiliary: No suspicious cystic or solid hepatic lesions. No intra or extrahepatic biliary ductal dilatation. Status post cholecystectomy. Pancreas: No pancreatic mass. No pancreatic ductal dilatation. No pancreatic or peripancreatic fluid or inflammatory  changes. Spleen: Unremarkable. Adrenals/Urinary Tract: 5.2 cm low-attenuation lesion in the interpolar region of the left kidney is compatible with a simple cyst. Other subcentimeter low-attenuation lesions in both kidneys, too small to characterize, but statistically likely to represent cysts. Scarring at site of ruptured cyst in the lower pole of the right kidney incidentally noted. No suspicious renal lesions. No hydroureteronephrosis. Urinary bladder is normal in appearance. Bilateral adrenal glands are normal in appearance. Stomach/Bowel: Normal appearance of the stomach. No pathologic dilatation of small bowel or colon.  Numerous colonic diverticulae are noted, without surrounding inflammatory changes to suggest an acute diverticulitis at this time. Normal appendix. Vascular/Lymphatic: Aortic atherosclerosis, without evidence of aneurysm or dissection in the abdominal or pelvic vasculature. No lymphadenopathy noted in the abdomen or pelvis. Reproductive: Uterus and ovaries are unremarkable in appearance. Other: No significant volume of ascites.  No pneumoperitoneum. Musculoskeletal: There are no aggressive appearing lytic or blastic lesions noted in the visualized portions of the skeleton. VASCULAR MEASUREMENTS PERTINENT TO TAVR: AORTA: Minimal Aortic Diameter-16 x 16 mm Severity of Aortic Calcification-mild RIGHT PELVIS: Right Common Iliac Artery - Minimal Diameter-8.8 x 6.9 mm Tortuosity-mild Calcification-moderate Right External Iliac Artery - Minimal Diameter-7.5 x 5.8 mm Tortuosity-moderate Calcification-moderate Right Common Femoral Artery - Minimal Diameter-7.2 x 6.0 mm Tortuosity-mild Calcification-moderate LEFT PELVIS: Left Common Iliac Artery - Minimal Diameter-6.9 x 7.9 mm Tortuosity-mild Calcification-moderate Left External Iliac Artery - Minimal Diameter-7.2 x 6.2 mm Tortuosity-moderate Calcification-moderate Left Common Femoral Artery - Minimal Diameter-7.8 x 4.7 mm Tortuosity-mild Calcification-moderate Review of the MIP images confirms the above findings. IMPRESSION: 1. Vascular findings and measurements pertinent to potential TAVR procedure, as detailed above. 2. Severe thickening calcification of the aortic valve, compatible with the reported clinical history of severe aortic stenosis. 3. Aortic atherosclerosis, in addition to left main and 2 vessel coronary artery disease. 4. Colonic diverticulosis, without evidence of acute diverticulitis at this time. 5. Additional incidental findings, as above. Electronically Signed   By: Trudie Reed M.D.   On: 03/31/2019 15:13   Vas US Carotid  Result Date:  03/31/2019 Carotid Arterial Duplex Study Indications: Pre-TAVR. Limitations: Vessel tortuosity, vessel movement Performing Technologist: Chanda Busing RVT  Examination Guidelines: A complete evaluation includes B-mode imaging, spectral Doppler, color Doppler, and power Doppler as needed of all accessible portions of each vessel. Bilateral testing is considered an integral part of a complete examination. Limited examinations for reoccurring indications may be performed as noted.  Right Carotid Findings: +----------+--------+--------+--------+-----------------------+--------+             PSV cm/s EDV cm/s Stenosis Describe                Comments  +----------+--------+--------+--------+-----------------------+--------+  CCA Prox   73       8                 smooth and heterogenous           +----------+--------+--------+--------+-----------------------+--------+  CCA Distal 59       8                 smooth and heterogenous           +----------+--------+--------+--------+-----------------------+--------+  ICA Prox   44       11                                                  +----------+--------+--------+--------+-----------------------+--------+  ICA Distal 66       19                                        tortuous  +----------+--------+--------+--------+-----------------------+--------+  ECA        70       12                                                  +----------+--------+--------+--------+-----------------------+--------+ +----------+--------+-------+--------+-------------------+             PSV cm/s EDV cms Describe Arm Pressure (mmHG)  +----------+--------+-------+--------+-------------------+  Subclavian 134                                            +----------+--------+-------+--------+-------------------+ +---------+--------+--+--------+--+---------+  Vertebral PSV cm/s 82 EDV cm/s 21 Antegrade  +---------+--------+--+--------+--+---------+  Left Carotid Findings:  +----------+--------+--------+--------+-----------------------+--------+             PSV cm/s EDV cm/s Stenosis Describe                Comments  +----------+--------+--------+--------+-----------------------+--------+  CCA Prox   96       14                smooth and heterogenous           +----------+--------+--------+--------+-----------------------+--------+  CCA Distal 75       13                smooth and heterogenous           +----------+--------+--------+--------+-----------------------+--------+  ICA Prox   66       18                                                  +----------+--------+--------+--------+-----------------------+--------+  ICA Distal 72       22                                        tortuous  +----------+--------+--------+--------+-----------------------+--------+  ECA        67       0                                                   +----------+--------+--------+--------+-----------------------+--------+ +----------+--------+--------+--------+-------------------+  Subclavian PSV cm/s EDV cm/s Describe Arm Pressure (mmHG)  +----------+--------+--------+--------+-------------------+             128                                             +----------+--------+--------+--------+-------------------+ +---------+--------+--+--------+--+---------+  Vertebral PSV cm/s 62 EDV cm/s 17 Antegrade  +---------+--------+--+--------+--+---------+  Summary:   *See table(s) above for  measurements and observations.  Electronically signed by Sherald Hesshristopher Clark MD on 03/31/2019 at 4:26:23 PM.    Final    Ct Angio Abdomen Pelvis  W &/or Wo Contrast  Result Date: 03/31/2019 CLINICAL DATA:  85 year old female with history of severe aortic stenosis. Preprocedural study prior to potential transcatheter aortic valve replacement (TAVR) procedure. EXAM: CT ANGIOGRAPHY CHEST, ABDOMEN AND PELVIS TECHNIQUE: Multidetector CT imaging through the chest, abdomen and pelvis was performed using the standard protocol  during bolus administration of intravenous contrast. Multiplanar reconstructed images and MIPs were obtained and reviewed to evaluate the vascular anatomy. CONTRAST:  100mL OMNIPAQUE IOHEXOL 350 MG/ML SOLN COMPARISON:  CT the chest, abdomen and pelvis 04/03/2016. FINDINGS: CTA CHEST FINDINGS Cardiovascular: Heart size is mildly enlarged with left atrial dilatation. There is no significant pericardial fluid, thickening or pericardial calcification. There is aortic atherosclerosis, as well as atherosclerosis of the great vessels of the mediastinum and the coronary arteries, including calcified atherosclerotic plaque in the left main, left anterior descending and right coronary arteries. Thickening calcification of the aortic valve. Mediastinum/Lymph Nodes: No pathologically enlarged mediastinal or hilar lymph nodes. Esophagus is unremarkable in appearance. No axillary lymphadenopathy. Lungs/Pleura: No acute consolidative airspace disease. No pleural effusions. No suspicious appearing pulmonary nodules or masses are noted. Musculoskeletal/Soft Tissues: There are no aggressive appearing lytic or blastic lesions noted in the visualized portions of the skeleton. CTA ABDOMEN AND PELVIS FINDINGS Hepatobiliary: No suspicious cystic or solid hepatic lesions. No intra or extrahepatic biliary ductal dilatation. Status post cholecystectomy. Pancreas: No pancreatic mass. No pancreatic ductal dilatation. No pancreatic or peripancreatic fluid or inflammatory changes. Spleen: Unremarkable. Adrenals/Urinary Tract: 5.2 cm low-attenuation lesion in the interpolar region of the left kidney is compatible with a simple cyst. Other subcentimeter low-attenuation lesions in both kidneys, too small to characterize, but statistically likely to represent cysts. Scarring at site of ruptured cyst in the lower pole of the right kidney incidentally noted. No suspicious renal lesions. No hydroureteronephrosis. Urinary bladder is normal in  appearance. Bilateral adrenal glands are normal in appearance. Stomach/Bowel: Normal appearance of the stomach. No pathologic dilatation of small bowel or colon. Numerous colonic diverticulae are noted, without surrounding inflammatory changes to suggest an acute diverticulitis at this time. Normal appendix. Vascular/Lymphatic: Aortic atherosclerosis, without evidence of aneurysm or dissection in the abdominal or pelvic vasculature. No lymphadenopathy noted in the abdomen or pelvis. Reproductive: Uterus and ovaries are unremarkable in appearance. Other: No significant volume of ascites.  No pneumoperitoneum. Musculoskeletal: There are no aggressive appearing lytic or blastic lesions noted in the visualized portions of the skeleton. VASCULAR MEASUREMENTS PERTINENT TO TAVR: AORTA: Minimal Aortic Diameter-16 x 16 mm Severity of Aortic Calcification-mild RIGHT PELVIS: Right Common Iliac Artery - Minimal Diameter-8.8 x 6.9 mm Tortuosity-mild Calcification-moderate Right External Iliac Artery - Minimal Diameter-7.5 x 5.8 mm Tortuosity-moderate Calcification-moderate Right Common Femoral Artery - Minimal Diameter-7.2 x 6.0 mm Tortuosity-mild Calcification-moderate LEFT PELVIS: Left Common Iliac Artery - Minimal Diameter-6.9 x 7.9 mm Tortuosity-mild Calcification-moderate Left External Iliac Artery - Minimal Diameter-7.2 x 6.2 mm Tortuosity-moderate Calcification-moderate Left Common Femoral Artery - Minimal Diameter-7.8 x 4.7 mm Tortuosity-mild Calcification-moderate Review of the MIP images confirms the above findings. IMPRESSION: 1. Vascular findings and measurements pertinent to potential TAVR procedure, as detailed above. 2. Severe thickening calcification of the aortic valve, compatible with the reported clinical history of severe aortic stenosis. 3. Aortic atherosclerosis, in addition to left main and 2 vessel coronary artery disease. 4. Colonic diverticulosis, without evidence of acute diverticulitis at this time.  5. Additional incidental findings, as above. Electronically Signed   By: Trudie Reed M.D.   On: 03/31/2019 15:13   Disposition   Pt is being discharged home today in good condition.  Follow-up Plans & Appointments    Follow-up Information    Janetta Hora, PA-C Follow up on 04/30/2019.   Specialties: Cardiology, Radiology Why: at 3pm for your follow up appt. Contact information: [address] [place] [address] [phone]             Discharge Medications     Medication List    STOP taking these medications   metoprolol tartrate 25 MG tablet Commonly known as: LOPRESSOR     TAKE these medications   acetaminophen 500 MG tablet Commonly known as: TYLENOL Take 500 mg by mouth every 6 (six) hours as needed for mild pain, moderate pain or headache.   amLODipine 10 MG tablet Commonly known as: NORVASC Take 1 tablet (10 mg total) by mouth daily.   amoxicillin 500 MG capsule Commonly known as: AMOXIL Take four capsules one hour before dental appointment.   apixaban 5 MG Tabs tablet Commonly known as: Eliquis Take 1 tablet (5 mg total) by mouth 2 (two) times daily.   aspirin 81 MG chewable tablet Chew 1 tablet (81 mg total) by mouth daily. Start taking on: April 25, 2019   CALCIUM 600 + D PO Take 1 tablet by mouth daily.   hydrochlorothiazide 12.5 MG tablet Commonly known as: HYDRODIURIL Take 1 tablet (12.5 mg total) by mouth daily.   levothyroxine 75 MCG tablet Commonly known as: SYNTHROID Take 75 mcg by mouth daily.   losartan 50 MG tablet Commonly known as: COZAAR Take 1 tablet (50 mg total) by mouth daily.   rosuvastatin 10 MG tablet Commonly known as: CRESTOR TAKE 1 TABLET BY MOUTH EVERY OTHER DAY   spironolactone 25 MG tablet Commonly known as: ALDACTONE Take 0.5 tablets (12.5 mg total) by mouth daily.          Acute coronary syndrome (MI, NSTEMI, STEMI, etc) this admission?: No.     Outstanding  Labs/Studies   Follow up echo.  Duration of Discharge Encounter   Greater than 30 minutes including physician time.  Signed, Laverda Page NP-C 04/24/2019, 12:22 PM   Patient seen, examined. Available data reviewed. Agree with findings, assessment, and plan as outlined by Laverda Page, NP. My personal exam findings are documented above. I have reviewed her echo which demonstrates vigorous LV function and a mean transaortic gradient of 22 mmHg which is expected in this patient with a 23 mm THV. There is no paravalvular leak. The patient's tele is reviewed and her rhythm is stable with occasional PVC's, maintaining sinus rhythm. She is back on apixaban for PAF and now on ASA 81 mg daily. She is stable for DC home today with routine post-TAVR precautions. She has a FU visit scheduled with Carlean Jews on 7/8.  Tonny Bollman, M.D. 04/24/2019 12:22 PM

## 2019-04-24 NOTE — Care Management Important Message (Signed)
Important Message  Patient Details  Name: [name] MRN: [id number] Date of Birth: 03-16-1931   Medicare Important Message Given:  Yes     Shelda Altes 04/24/2019, 2:23 PM

## 2019-04-26 NOTE — Progress Notes (Addendum)
[facility]                                       Cardiology Office Note    Date:  04/30/2019   ID:  [name], DOB [birth year], MRN [id number]  PCP:  Leonard Downing, MD  Cardiologist: Dr. Burt Knack / Dr. Burt Knack & Roxy Manns (TAVR)  CC: TOC s/p TAVR  History of Present Illness:  [name] is a 85 y.o. female with a history of HTN, PAF on Eliquis, DMT2, HLD and severe AS s/p TAVR (04/22/19) who presents to clinic for follow up.   She was noted to have a heart murmur on physical exam many years ago and was diagnosed with aortic stenosis. For the last several years she has been followed by Dr. Burt Knack with serial echocardiograms. Approximately 3 years ago she was noted to be in atrial fibrillation and started on long-term anticoagulation using Eliquis. Most recent twelve-lead EKG revealed normal sinus rhythm. Over the past year the patient has developed decreased exercise tolerance with worsening fatigue. Follow-up transthoracic echocardiogram performed December 16, 2018 revealed significant progression and severity of aortic stenosis with peak velocity across aortic valve measured greater than 4.1 m/s corresponding to mean transvalvular gradient estimated 40 mmHg. The DVI was notably quite low at 0.14 and aortic valve area calculated only 0.35 cm. Left ventricular systolic function remain preserved. Left and right heart catheterization was performed March 24, 2019 and confirmed the presence of severe aortic stenosis with peak to peak and mean transvalvular gradients measured 46 and 31 mmHg, respectively. There was mild nonobstructive coronary artery disease with 40 to 50% stenosis of the proximal right coronary artery, diffuse irregularity of the left anterior descending coronary artery and minor irregularities in the left circumflex system.  She was evaluated by the multidisciplinary valve team and underwent successful TAVR  with a 23 mm Edwards Sapien THV via the TF approach on 04/22/19. She had episode of post op bradycardia and Lopressor 25mg  BID was discontinued. Post op echo showed normally functioning TAVR with elevated mean gradient of 32mm hg and trivial PVL. She was discharged on aspirin and Eliquis.   Today she presents for follow up. No CP or SOB. No LE edema, orthopnea or PND. No dizziness or syncope. No blood in stool or urine. No palpitations. Has been walking around the house- thinks she can tell a difference in how she feels s/p TAVR with a little more energy.    Past Medical History:  Diagnosis Date  . Exogenous obesity   . Hyperlipidemia   . Hypertension   . Hypothyroidism   . 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 Severe aortic stenosis   . Type II diabetes mellitus ([place])   . Wears glasses     Past Surgical History:  Procedure Laterality Date  . LAPAROSCOPIC CHOLECYSTECTOMY  2008  . RIGHT/LEFT HEART CATH AND CORONARY ANGIOGRAPHY N/A 03/24/2019   Procedure: RIGHT/LEFT HEART CATH AND CORONARY ANGIOGRAPHY;  Surgeon: Sherren Mocha, MD;  Location: [facility];  Service: Cardiovascular;  Laterality: N/A;  . TEE WITHOUT CARDIOVERSION N/A 04/22/2019   Procedure: TRANSESOPHAGEAL ECHOCARDIOGRAM (TEE);  Surgeon: Sherren Mocha, MD;  Location: [facility];  Service: Open Heart Surgery;  Laterality: N/A;  .  TRANSCATHETER AORTIC VALVE REPLACEMENT, TRANSFEMORAL N/A 04/22/2019   Procedure: TRANSCATHETER AORTIC VALVE REPLACEMENT, TRANSFEMORAL;  Surgeon: Tonny Bollmanooper, Michael, MD;  [facility];  Service: Open Heart Surgery;  Laterality: N/A;    Current Medications: Outpatient Medications Prior to Visit  Medication Sig Dispense Refill  . acetaminophen (TYLENOL) 500 MG tablet Take 500 mg by mouth every 6 (six) hours as needed for mild pain, moderate pain or  headache.     Marland Kitchen. amLODipine (NORVASC) 10 MG tablet Take 1 tablet (10 mg total) by mouth daily. 30 tablet 6  . amoxicillin (AMOXIL) 500 MG capsule Take four capsules one hour before dental appointment. 4 capsule 1  . apixaban (ELIQUIS) 5 MG TABS tablet Take 1 tablet (5 mg total) by mouth 2 (two) times daily. 180 tablet 3  . aspirin 81 MG chewable tablet Chew 1 tablet (81 mg total) by mouth daily. 30 tablet 3  . Calcium Carb-Cholecalciferol (CALCIUM 600 + D PO) Take 1 tablet by mouth daily.    . hydrochlorothiazide (HYDRODIURIL) 12.5 MG tablet Take 1 tablet (12.5 mg total) by mouth daily. 90 tablet 2  . levothyroxine (SYNTHROID, LEVOTHROID) 75 MCG tablet Take 75 mcg by mouth daily.    Marland Kitchen. losartan (COZAAR) 50 MG tablet Take 1 tablet (50 mg total) by mouth daily. 90 tablet 3  . rosuvastatin (CRESTOR) 10 MG tablet TAKE 1 TABLET BY MOUTH EVERY OTHER DAY 60 tablet 1  . spironolactone (ALDACTONE) 25 MG tablet Take 0.5 tablets (12.5 mg total) by mouth daily. 45 tablet 3   No facility-administered medications prior to visit.      Allergies:   Lisinopril   Social History   Socioeconomic History  . Marital status: Widowed    Spouse name: Not on file  . Number of children: Not on file  . Years of education: Not on file  . Highest education level: Not on file  Occupational History  . Not on file  Social Needs  . Financial resource strain: Not on file  . Food insecurity    Worry: Not on file    Inability: Not on file  . Transportation needs    Medical: Not on file    Non-medical: Not on file  Tobacco Use  . Smoking status: Never Smoker  . Smokeless tobacco: Never Used  Substance and Sexual Activity  . Alcohol use: No  . Drug use: No  . Sexual activity: Not on file  Lifestyle  . Physical activity    Days per week: Not on file    Minutes per session: Not on file  . Stress: Not on file  Relationships  . Social Musicianconnections    Talks on phone: Not on file    Gets together: Not on file     Attends religious service: Not on file    Active member of club or organization: Not on file    Attends meetings of clubs or organizations: Not on file    Relationship status: Not on file  Other Topics Concern  . Not on file  Social History Narrative   6 grandkids   4 great grandkids   2 great great grandkids     Family History:  The patient's family history includes Diabetes in her sister, sister, and sister; Heart attack in her father and sister; Hypertension in her sister; Kidney failure in her sister.     ROS:   Please see the history of present illness.    ROS All other systems reviewed and  are negative.   PHYSICAL EXAM:   VS:  BP 138/70   Pulse 72   Ht  (1.626 m)   Wt 195 lb (88.5 kg)   LMP  (LMP Unknown)   SpO2 99%   BMI 33.47 kg/m    GEN: Well nourished, well developed, in no acute distress HEENT: normal Neck: no JVD or masses Cardiac: RRR; 2/6 SEM. No rubs, or gallops,no edema  Respiratory:  clear to auscultation bilaterally, normal work of breathing GI: soft, nontender, nondistended, + BS MS: no deformity or atrophy Skin: warm and dry, no rash.  Groin sites clear without hematoma or ecchymosis  Neuro:  Alert and Oriented x 3, Strength and sensation are intact Psych: euthymic mood, full affect  Wt Readings from Last 3 Encounters:  04/30/19 195 lb (88.5 kg)  04/24/19 197 lb 12.8 oz (89.7 kg)  04/18/19 198 lb 4.8 oz (89.9 kg)      Studies/Labs Reviewed:   EKG:  EKG is ordered today.  The ekg ordered today demonstrates sinus rhythm with marked sinus arrhythmia HR 72  Recent Labs: 04/18/2019: ALT 17; B Natriuretic Peptide 171.4 04/23/2019: Hemoglobin 12.7; Magnesium 1.7; Platelets 183 04/24/2019: BUN 18; Creatinine, Ser 1.27; Potassium 4.4; Sodium 136   Lipid Panel    Component Value Date/Time   CHOL 141 08/25/2015 0935   TRIG 113 08/25/2015 0935   HDL 52 08/25/2015 0935   CHOLHDL 2.7 08/25/2015 0935   VLDL 23 08/25/2015 0935   LDLCALC 66 08/25/2015  0935    Additional studies/ records that were reviewed today include:  TAVR: 04/22/19  Procedure:   Transcatheter Aortic Valve Replacement - Percutaneous Transfemoral Approach Edwards Sapien 3 THV (size 23mm, model # 9600TFX, serial # [id number])  Co-Surgeons:Clarence H. Cornelius Moras, MD and Tonny Bollman, MD  Anesthesiologist:Ryan Bradley Ferris, MD  Echocardiographer:Mihai Croitoru, MD  Pre-operative Echo Findings: ? Severe aortic stenosis ? Normalleft ventricular systolic function  Post-operative Echo Findings: ? Noparavalvular leak ? Normalleft ventricular systolic function  _____________   Echo 04/23/19 IMPRESSIONS  1. The left ventricle has hyperdynamic systolic function, with an ejection fraction of >65%. The cavity size was normal. Left ventricular diastolic Doppler parameters are consistent with pseudonormalization. No evidence of left ventricular regional wall  motion abnormalities.  2. The right ventricle has normal systolic function. The cavity was normal. There is no increase in right ventricular wall thickness.  3. Left atrial size was severely dilated.  4. Trivial pericardial effusion is present.  5. No evidence of mitral valve stenosis. Trivial mitral regurgitation.  6. Bioprosthetic aortic valve s/p TAVR. Trivial peri-valvular regurgitation. Mean gradient is elevated at 22 mmHg. AVA 1.68 cm^2.  7. The aortic root is normal in size and structure.  8. Normal IVC size. PA systolic pressure 32 mmHg.    ASSESSMENT & PLAN:   Severe AS s/p TAVR: doing well. Groin site are healing well. ECG with sinus and no HAVB. Has amoxicillin for SBE prophyaxis. I will see her back in 1 month after echo for a virtual visit.   HTN: BP a little elevated today. Plan to resume home Lopressor 25 mg BID. HR 72 bpm with no conduction abnormalties  Bradycardia: resolved. Resume BB as above  PAF:  maintaining NSR today. Continue Eliquis. Resume BB  Medication Adjustments/Labs and Tests Ordered: Current medicines are reviewed at length with the patient today.  Concerns regarding medicines are outlined above.  Medication changes, Labs and Tests ordered today are listed in the Patient Instructions below. Patient Instructions  Medication  Instructions:  1) RESTART METOPROLOL 25 mg twice daily 2) Your provider discussed the importance of taking an antibiotic prior to all dental visits to prevent damage to the heart valves from infection. You were given a prescription for AMOXIL 2,000 mg to take one hour prior to any dental appointment.   Labwork: None  Testing/Procedures: Your provider has requested that you have an echocardiogram. Echocardiography is a painless test that uses sound waves to create images of your heart. It provides your doctor with information about the size and shape of your heart and how well your heart's chambers and valves are working. This procedure takes approximately one hour. There are no restrictions for this procedure. Your echo is scheduled 05/14/2019 at 11:20AM. Please arrive 15 minutes prior to your appointment for check-in.  Follow-Up: You have a VIRTUAL VISIT scheduled with Carlean JewsKatie  on 05/15/2019 at 1:00PM.  YOUR CARDIOLOGY TEAM HAS ARRANGED FOR VIRTUAL VISIT FOR YOUR APPOINTMENT - PLEASE REVIEW IMPORTANT INFORMATION BELOW SEVERAL DAYS PRIOR TO YOUR APPOINTMENT  Due to the recent COVID-19 pandemic, we are transitioning in-person office visits to tele-medicine visits in an effort to decrease unnecessary exposure to our patients, their families, and staff. These visits are billed to your insurance just like a normal visit is. We also encourage you to sign up for MyChart if you have not already done so. You will need a smartphone if possible. For patients that do not have this, we can still complete the visit using a regular telephone but do prefer a smartphone  to enable video when possible. You may have a family member that lives with you that can help. If possible, we also ask that you have a blood pressure cuff and scale at home to measure your blood pressure, heart rate and weight prior to your scheduled appointment. Patients with clinical needs that need an in-person evaluation and testing will still be able to come to the office if absolutely necessary. If you have any questions, feel free to call our office.   THE DAY OF YOUR APPOINTMENT  Approximately 15 minutes prior to your scheduled appointment, you will receive a telephone call from one of [facility] team - your caller ID may say "Unknown caller."  Our staff will confirm medications, vital signs for the day and any symptoms you may be experiencing. Please have this information available prior to the time of visit start. It may also be helpful for you to have a pad of paper and pen handy for any instructions given during your visit. They will also walk you through joining the smartphone meeting if this is a video visit.   CONSENT FOR TELE-HEALTH VISIT - PLEASE REVIEW  I hereby voluntarily request, consent and authorize [facility] and its employed or contracted physicians, physician assistants, nurse practitioners or other licensed health care professionals (the Practitioner), to provide me with telemedicine health care services (the "Services") as deemed necessary by the treating Practitioner. I acknowledge and consent to receive the Services by the Practitioner via telemedicine. I understand that the telemedicine visit will involve communicating with the Practitioner through live audiovisual communication technology and the disclosure of certain medical information by electronic transmission. I acknowledge that I have been given the opportunity to request an in-person assessment or other available alternative prior to the telemedicine visit and am voluntarily participating in the telemedicine  visit.  I understand that I have the right to withhold or withdraw my consent to the use of telemedicine in the course of my care  at any time, without affecting my right to future care or treatment, and that the Practitioner or I may terminate the telemedicine visit at any time. I understand that I have the right to inspect all information obtained and/or recorded in the course of the telemedicine visit and may receive copies of available information for a reasonable fee.  I understand that some of the potential risks of receiving the Services via telemedicine include:  Marland Kitchen. Delay or interruption in medical evaluation due to technological equipment failure or disruption; . Information transmitted may not be sufficient (e.g. poor resolution of images) to allow for appropriate medical decision making by the Practitioner; and/or  . In rare instances, security protocols could fail, causing a breach of personal health information.  Furthermore, I acknowledge that it is my responsibility to provide information about my medical history, conditions and care that is complete and accurate to the best of my ability. I acknowledge that Practitioner's advice, recommendations, and/or decision may be based on factors not within their control, such as incomplete or inaccurate data provided by me or distortions of diagnostic images or specimens that may result from electronic transmissions. I understand that the practice of medicine is not an exact science and that Practitioner makes no warranties or guarantees regarding treatment outcomes. I acknowledge that I will receive a copy of this consent concurrently upon execution via email to the email address I last provided but may also request a printed copy by calling the office of [facility].    I understand that my insurance will be billed for this visit.   I have read or had this consent read to me. . I understand the contents of this consent, which adequately explains  the benefits and risks of the Services being provided via telemedicine.  . I have been provided ample opportunity to ask questions regarding this consent and the Services and have had my questions answered to my satisfaction. . I give my informed consent for the services to be provided through the use of telemedicine in my medical care  By participating in this telemedicine visit I agree to the above.     Signed, Cline CrockKathryn , PA-C  04/30/2019 3:27 PM    [facility] [address], [place], [place]  [address] Phone: [phone]) [phone]; Fax: [phone]

## 2019-04-28 ENCOUNTER — Telehealth: Payer: Self-pay | Admitting: Physician Assistant

## 2019-04-28 NOTE — Telephone Encounter (Signed)
  [facility] VALVE TEAM   Patient contacted regarding discharge from [facility] on 04/24/19  Patient understands to follow up with provider Nell Range on 7/8 @ 3pm at [place].  Patient understands discharge instructions? yes Patient understands medications and regimen? yes Patient understands to bring all medications to this visit? Yes   Angelena Form PA-C  [facility]

## 2019-04-30 ENCOUNTER — Encounter: Payer: Self-pay | Admitting: Physician Assistant

## 2019-04-30 ENCOUNTER — Telehealth: Payer: Self-pay | Admitting: Physician Assistant

## 2019-04-30 ENCOUNTER — Other Ambulatory Visit: Payer: Self-pay

## 2019-04-30 ENCOUNTER — Ambulatory Visit (INDEPENDENT_AMBULATORY_CARE_PROVIDER_SITE_OTHER): Payer: Medicare Other | Admitting: Physician Assistant

## 2019-04-30 VITALS — BP 138/70 | HR 72 | Ht 64.0 in | Wt 195.0 lb

## 2019-04-30 DIAGNOSIS — I480 Paroxysmal atrial fibrillation: Secondary | ICD-10-CM | POA: Diagnosis not present

## 2019-04-30 DIAGNOSIS — Z952 Presence of prosthetic heart valve: Secondary | ICD-10-CM | POA: Diagnosis not present

## 2019-04-30 DIAGNOSIS — R001 Bradycardia, unspecified: Secondary | ICD-10-CM

## 2019-04-30 DIAGNOSIS — I10 Essential (primary) hypertension: Secondary | ICD-10-CM

## 2019-04-30 MED ORDER — METOPROLOL TARTRATE 25 MG PO TABS: 25.0000 mg | ORAL_TABLET | Freq: Two times a day (BID) | ORAL | 3 refills | Status: DC

## 2019-04-30 NOTE — Telephone Encounter (Signed)
  Daughter is calling because she wants to make sure someone calls her to let her know what happened during her moms appt.

## 2019-04-30 NOTE — Telephone Encounter (Signed)
done

## 2019-04-30 NOTE — Telephone Encounter (Signed)

## 2019-04-30 NOTE — Patient Instructions (Addendum)
Medication Instructions:  1) RESTART METOPROLOL 25 mg twice daily 2) Your provider discussed the importance of taking an antibiotic prior to all dental visits to prevent damage to the heart valves from infection. You were given a prescription for AMOXIL 2,000 mg to take one hour prior to any dental appointment.   Labwork: None  Testing/Procedures: Your provider has requested that you have an echocardiogram. Echocardiography is a painless test that uses sound waves to create images of your heart. It provides your doctor with information about the size and shape of your heart and how well your heart's chambers and valves are working. This procedure takes approximately one hour. There are no restrictions for this procedure. Your echo is scheduled 05/14/2019 at 11:20AM. Please arrive 15 minutes prior to your appointment for check-in.  Follow-Up: You have a VIRTUAL VISIT scheduled with Nell Range on 05/15/2019 at 1:00PM.  YOUR CARDIOLOGY TEAM HAS ARRANGED FOR VIRTUAL VISIT FOR YOUR APPOINTMENT - PLEASE REVIEW IMPORTANT INFORMATION BELOW SEVERAL DAYS PRIOR TO YOUR APPOINTMENT  Due to the recent COVID-19 pandemic, we are transitioning in-person office visits to tele-medicine visits in an effort to decrease unnecessary exposure to our patients, their families, and staff. These visits are billed to your insurance just like a normal visit is. We also encourage you to sign up for MyChart if you have not already done so. You will need a smartphone if possible. For patients that do not have this, we can still complete the visit using a regular telephone but do prefer a smartphone to enable video when possible. You may have a family member that lives with you that can help. If possible, we also ask that you have a blood pressure cuff and scale at home to measure your blood pressure, heart rate and weight prior to your scheduled appointment. Patients with clinical needs that need an in-person evaluation and  testing will still be able to come to the office if absolutely necessary. If you have any questions, feel free to call our office.   THE DAY OF YOUR APPOINTMENT  Approximately 15 minutes prior to your scheduled appointment, you will receive a telephone call from one of [facility] team - your caller ID may say "Unknown caller."  Our staff will confirm medications, vital signs for the day and any symptoms you may be experiencing. Please have this information available prior to the time of visit start. It may also be helpful for you to have a pad of paper and pen handy for any instructions given during your visit. They will also walk you through joining the smartphone meeting if this is a video visit.   CONSENT FOR TELE-HEALTH VISIT - PLEASE REVIEW  I hereby voluntarily request, consent and authorize [facility] and its employed or contracted physicians, physician assistants, nurse practitioners or other licensed health care professionals (the Practitioner), to provide me with telemedicine health care services (the "Services") as deemed necessary by the treating Practitioner. I acknowledge and consent to receive the Services by the Practitioner via telemedicine. I understand that the telemedicine visit will involve communicating with the Practitioner through live audiovisual communication technology and the disclosure of certain medical information by electronic transmission. I acknowledge that I have been given the opportunity to request an in-person assessment or other available alternative prior to the telemedicine visit and am voluntarily participating in the telemedicine visit.  I understand that I have the right to withhold or withdraw my consent to the use of telemedicine in the course of my care  at any time, without affecting my right to future care or treatment, and that the Practitioner or I may terminate the telemedicine visit at any time. I understand that I have the right to inspect all  information obtained and/or recorded in the course of the telemedicine visit and may receive copies of available information for a reasonable fee.  I understand that some of the potential risks of receiving the Services via telemedicine include:  Marland Kitchen. Delay or interruption in medical evaluation due to technological equipment failure or disruption; . Information transmitted may not be sufficient (e.g. poor resolution of images) to allow for appropriate medical decision making by the Practitioner; and/or  . In rare instances, security protocols could fail, causing a breach of personal health information.  Furthermore, I acknowledge that it is my responsibility to provide information about my medical history, conditions and care that is complete and accurate to the best of my ability. I acknowledge that Practitioner's advice, recommendations, and/or decision may be based on factors not within their control, such as incomplete or inaccurate data provided by me or distortions of diagnostic images or specimens that may result from electronic transmissions. I understand that the practice of medicine is not an exact science and that Practitioner makes no warranties or guarantees regarding treatment outcomes. I acknowledge that I will receive a copy of this consent concurrently upon execution via email to the email address I last provided but may also request a printed copy by calling the office of [facility].    I understand that my insurance will be billed for this visit.   I have read or had this consent read to me. . I understand the contents of this consent, which adequately explains the benefits and risks of the Services being provided via telemedicine.  . I have been provided ample opportunity to ask questions regarding this consent and the Services and have had my questions answered to my satisfaction. . I give my informed consent for the services to be provided through the use of telemedicine in my  medical care  By participating in this telemedicine visit I agree to the above.

## 2019-05-01 ENCOUNTER — Encounter: Payer: Self-pay | Admitting: Thoracic Surgery (Cardiothoracic Vascular Surgery)

## 2019-05-05 ENCOUNTER — Other Ambulatory Visit: Payer: Self-pay | Admitting: Cardiovascular Disease

## 2019-05-13 ENCOUNTER — Other Ambulatory Visit: Payer: Self-pay

## 2019-05-13 DIAGNOSIS — Z952 Presence of prosthetic heart valve: Secondary | ICD-10-CM

## 2019-05-13 NOTE — Progress Notes (Signed)
[facility]   MULTIDISCIPLINARY HEART VALVE CLINIC                                       Cardiology Office Note    Date:  05/14/2019   ID:  Jane CareGertrude J Jorgensen, DOB [birth year], MRN [id number]  PCP:  Kaleen MaskElkins, Wilson Oliver, MD  Cardiologist: Dr. Excell Seltzerooper / Dr. Excell Seltzerooper & Cornelius Moraswen (TAVR)  CC: 1 month s/p TAVR   History of Present Illness:  [name] is a 85 y.o. female with a history of HTN, PAF on Eliquis, DMT2, HLD and severe AS s/p TAVR (04/22/19) who presents to clinic for follow up.   She was evaluated by the multidisciplinary valve team and underwent successful TAVR with a 23 mm Edwards Sapien THV via the TF approach on 04/22/19. She had an episode of post op bradycardia and Lopressor 25mg  BID was discontinued. Post op echo showed normally functioning TAVR with elevated mean gradient of 23mm hg and trivial PVL. She was discharged on aspirin and Eliquis.   I saw her for 1 week follow up and BP was more elevated. Her HR was stable in the 70s so Lopressor 25mg  BID was resumed.   Today she presents to clinic for follow up. No CP or SOB. No LE edema, orthopnea or PND. No dizziness or syncope. No blood in stool or urine. No palpitations.    Past Medical History:  Diagnosis Date  . Exogenous obesity   . Hyperlipidemia   . Hypertension   . Hypothyroidism   . 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 Severe aortic stenosis   . Type II diabetes mellitus (HCC)   . Wears glasses     Past Surgical History:  Procedure Laterality Date  . LAPAROSCOPIC CHOLECYSTECTOMY  2008  . RIGHT/LEFT HEART CATH AND CORONARY ANGIOGRAPHY N/A 03/24/2019   Procedure: RIGHT/LEFT HEART CATH AND CORONARY ANGIOGRAPHY;  Surgeon: Tonny Bollmanooper, Michael, MD;  [facility];  Service: Cardiovascular;  Laterality: N/A;  . TEE WITHOUT CARDIOVERSION N/A 04/22/2019    Procedure: TRANSESOPHAGEAL ECHOCARDIOGRAM (TEE);  Surgeon: Tonny Bollmanooper, Michael, MD;  [facility];  Service: Open Heart Surgery;  Laterality: N/A;  . TRANSCATHETER AORTIC VALVE REPLACEMENT, TRANSFEMORAL N/A 04/22/2019   Procedure: TRANSCATHETER AORTIC VALVE REPLACEMENT, TRANSFEMORAL;  Surgeon: Tonny Bollmanooper, Michael, MD;  [facility];  Service: Open Heart Surgery;  Laterality: N/A;    Current Medications: Outpatient Medications Prior to Visit  Medication Sig Dispense Refill  . acetaminophen (TYLENOL) 500 MG tablet Take 500 mg by mouth every 6 (six) hours as needed for mild pain, moderate pain or headache.     Marland Kitchen. amLODipine (NORVASC) 10 MG tablet TAKE 1 TABLET BY MOUTH DAILY 30 tablet 11  . amoxicillin (AMOXIL) 500 MG capsule Take four capsules one hour before dental appointment. 4 capsule 1  . apixaban (ELIQUIS) 5 MG TABS tablet Take 1 tablet (5 mg total) by mouth 2 (two) times daily. 180 tablet 3  . aspirin 81 MG chewable tablet Chew 1 tablet (81 mg total) by mouth daily. 30 tablet 3  . Calcium Carb-Cholecalciferol (CALCIUM 600 + D PO) Take 1 tablet by mouth daily.    . hydrochlorothiazide (HYDRODIURIL) 12.5 MG tablet Take 1 tablet (12.5 mg total) by mouth daily. 90 tablet  2  . hydrochlorothiazide (HYDRODIURIL) 25 MG tablet Take 25 mg by mouth as needed.    Marland Kitchen levothyroxine (SYNTHROID, LEVOTHROID) 75 MCG tablet Take 75 mcg by mouth daily.    Marland Kitchen losartan (COZAAR) 50 MG tablet Take 1 tablet (50 mg total) by mouth daily. 90 tablet 3  . metFORMIN (GLUCOPHAGE) 500 MG tablet Take 500 mg by mouth 2 (two) times daily with a meal.    . metoprolol tartrate (LOPRESSOR) 25 MG tablet Take 1 tablet (25 mg total) by mouth 2 (two) times daily. 180 tablet 3  . omeprazole (PRILOSEC) 40 MG capsule Take 40 mg by mouth as needed.    . rosuvastatin (CRESTOR) 10 MG tablet TAKE 1 TABLET BY MOUTH EVERY OTHER DAY 60 tablet 1  . spironolactone (ALDACTONE) 25 MG tablet Take 0.5 tablets (12.5 mg total)  by mouth daily. 45 tablet 3  . traMADol (ULTRAM) 50 MG tablet Take 50 mg by mouth every 6 (six) hours as needed.     No facility-administered medications prior to visit.      Allergies:   Lisinopril   Social History   Socioeconomic History  . Marital status: Widowed    Spouse name: Not on file  . Number of children: Not on file  . Years of education: Not on file  . Highest education level: Not on file  Occupational History  . Not on file  Social Needs  . Financial resource strain: Not on file  . Food insecurity    Worry: Not on file    Inability: Not on file  . Transportation needs    Medical: Not on file    Non-medical: Not on file  Tobacco Use  . Smoking status: Never Smoker  . Smokeless tobacco: Never Used  Substance and Sexual Activity  . Alcohol use: No  . Drug use: No  . Sexual activity: Not on file  Lifestyle  . Physical activity    Days per week: Not on file    Minutes per session: Not on file  . Stress: Not on file  Relationships  . Social Herbalist on phone: Not on file    Gets together: Not on file    Attends religious service: Not on file    Active member of club or organization: Not on file    Attends meetings of clubs or organizations: Not on file    Relationship status: Not on file  Other Topics Concern  . Not on file  Social History Narrative   6 grandkids   4 great grandkids   2 great great grandkids     Family History:  The patient's family history includes Diabetes in her sister, sister, and sister; Heart attack in her father and sister; Hypertension in her sister; Kidney failure in her sister.      ROS:   Please see the history of present illness.    ROS All other systems reviewed and are negative.   PHYSICAL EXAM:   VS:  BP 130/60   Pulse (!) 55   Ht 5\' 4"  (1.626 m)   Wt 197 lb 9.6 oz (89.6 kg)   LMP  (LMP Unknown)   SpO2 99%   BMI 33.92 kg/m    GEN: Well nourished, well developed, in no acute distress HEENT:  normal Neck: no JVD or masses Cardiac: RRR; 2/6 SEM. No rubs, or gallops,no edema  Respiratory:  clear to auscultation bilaterally, normal work of breathing GI: soft, nontender, nondistended, + BS MS:  no deformity or atrophy Skin: warm and dry, no rash Neuro:  Alert and Oriented x 3, Strength and sensation are intact Psych: euthymic mood, full affect   Wt Readings from Last 3 Encounters:  05/14/19 197 lb 9.6 oz (89.6 kg)  04/30/19 195 lb (88.5 kg)  04/24/19 197 lb 12.8 oz (89.7 kg)      Studies/Labs Reviewed:   EKG:  EKG is NOT ordered today.    Recent Labs: 04/18/2019: ALT 17; B Natriuretic Peptide 171.4 04/23/2019: Hemoglobin 12.7; Magnesium 1.7; Platelets 183 04/24/2019: BUN 18; Creatinine, Ser 1.27; Potassium 4.4; Sodium 136   Lipid Panel    Component Value Date/Time   CHOL 141 08/25/2015 0935   TRIG 113 08/25/2015 0935   HDL 52 08/25/2015 0935   CHOLHDL 2.7 08/25/2015 0935   VLDL 23 08/25/2015 0935   LDLCALC 66 08/25/2015 0935    Additional studies/ records that were reviewed today include:  TAVR: 04/22/19  Procedure:   Transcatheter Aortic Valve Replacement - Percutaneous Transfemoral Approach Edwards Sapien 3 THV (size 23mm, model # 9600TFX, serial # [id number])  Co-Surgeons:Clarence H. Cornelius Moraswen, MD and Tonny BollmanMichael Cooper, MD  Anesthesiologist:Ryan Bradley FerrisEllender, MD  Echocardiographer:Mihai Croitoru, MD  Pre-operative Echo Findings: ? Severe aortic stenosis ? Normalleft ventricular systolic function  Post-operative Echo Findings: ? Noparavalvular leak ? Normalleft ventricular systolic function  _____________   Echo 04/23/19 IMPRESSIONS 1. The left ventricle has hyperdynamic systolic function, with an ejection fraction of >65%. The cavity size was normal. Left ventricular diastolic Doppler parameters are consistent with pseudonormalization. No evidence of left ventricular  regional wall motion abnormalities. 2. The right ventricle has normal systolic function. The cavity was normal. There is no increase in right ventricular wall thickness. 3. Left atrial size was severely dilated. 4. Trivial pericardial effusion is present. 5. No evidence of mitral valve stenosis. Trivial mitral regurgitation. 6. Bioprosthetic aortic valve s/p TAVR. Trivial peri-valvular regurgitation. Mean gradient is elevated at 22 mmHg. AVA 1.68 cm^2. 7. The aortic root is normal in size and structure. 8. Normal IVC size. PA systolic pressure 32 mmHg.  _____________  Echo 05/14/19 IMPRESSIONS    1. The left ventricle has normal systolic function with an ejection fraction of 60-65%. The cavity size was normal. Left ventricular diastolic Doppler parameters are consistent with pseudonormalization. Elevated mean left atrial pressure No evidence of  left ventricular regional wall motion abnormalities.  2. The right ventricle has normal systolic function. The cavity was normal. There is no increase in right ventricular wall thickness. Right ventricular systolic pressure is moderately elevated with an estimated pressure of 52.9 mmHg.  3. Left atrial size was moderately dilated.  4. The pericardial effusion is circumferential.  5. Trivial pericardial effusion is present.  6. Mild thickening of the mitral valve leaflet. Mitral valve regurgitation is mild to moderate by color flow Doppler. The MR jet is centrally-directed.  7. A 23mm an Carolin GuernseyEdwards Edwards Sapien bioprosthetic aortic valve (TAVR) valve is present in the aortic position. Procedure Date: 04/22/19.  8. The aorta is normal in size and structure.  9. The inferior vena cava was dilated in size with >50% respiratory variability.  ASSESSMENT & PLAN:   Severe AS s/p TAVR: echo today shows EF 60%, normally functioning TAVR with a mean gradient of 11 mmHg and no PVL, mod pulm HTN. She has NYHA class I symptoms. SBE prophylaxis  discussed; she has amoxicillin. ASA can be discontinued after 6 months of therapy and she will continue on long term eliquis. Aspirin stop date  12/30. I will see her back in 1 year with an echo  HTN: BP well controlled today. Continue current regimen.  PAF: sounds regular on exam today. Continue Lopressor and Eliquis   Medication Adjustments/Labs and Tests Ordered: Current medicines are reviewed at length with the patient today.  Concerns regarding medicines are outlined above.  Medication changes, Labs and Tests ordered today are listed in the Patient Instructions below. Patient Instructions  Medication Instructions:   Please continue to take you aspirin until December 30th. You will continue on your Eliquis.  Discussed the use of prophylactic antibiotics before dental work and other surgeries. Prescription given for Amoxicillin 2 grams orally one hour before procedure.  Labwork: None ordered.  Testing/Procedures: None ordered.  Follow-Up:  You have a follow up appointment with Tereso NewcomerScott Weaver, PA on Nov 3 @ 10:15am  Any Other Special Instructions Will Be Listed Below (If Applicable).     If you need a refill on your cardiac medications before your next appointment, please call your pharmacy.      Signed, Cline CrockKathryn Delorice Bannister, PA-C  05/14/2019 7:51 PM    [facility] [address], [place], [place]  [address] Phone: [phone]; Fax: [phone]

## 2019-05-14 ENCOUNTER — Encounter: Payer: Self-pay | Admitting: Physician Assistant

## 2019-05-14 ENCOUNTER — Telehealth: Payer: Self-pay | Admitting: Physician Assistant

## 2019-05-14 ENCOUNTER — Ambulatory Visit (INDEPENDENT_AMBULATORY_CARE_PROVIDER_SITE_OTHER): Payer: Medicare Other | Admitting: Physician Assistant

## 2019-05-14 ENCOUNTER — Ambulatory Visit (HOSPITAL_COMMUNITY): Payer: Medicare Other | Attending: Cardiovascular Disease

## 2019-05-14 ENCOUNTER — Other Ambulatory Visit: Payer: Self-pay

## 2019-05-14 VITALS — BP 130/60 | HR 55 | Ht 64.0 in | Wt 197.6 lb

## 2019-05-14 DIAGNOSIS — I350 Nonrheumatic aortic (valve) stenosis: Secondary | ICD-10-CM | POA: Insufficient documentation

## 2019-05-14 DIAGNOSIS — Z952 Presence of prosthetic heart valve: Secondary | ICD-10-CM

## 2019-05-14 DIAGNOSIS — I480 Paroxysmal atrial fibrillation: Secondary | ICD-10-CM | POA: Diagnosis not present

## 2019-05-14 DIAGNOSIS — I10 Essential (primary) hypertension: Secondary | ICD-10-CM

## 2019-05-14 DIAGNOSIS — R001 Bradycardia, unspecified: Secondary | ICD-10-CM

## 2019-05-14 NOTE — Patient Instructions (Addendum)
Medication Instructions:   Please continue to take you aspirin until December 30th. You will continue on your Eliquis.  Discussed the use of prophylactic antibiotics before dental work and other surgeries. Prescription given for Amoxicillin 2 grams orally one hour before procedure.  Labwork: None ordered.  Testing/Procedures: None ordered.  Follow-Up:  You have a follow up appointment with Richardson Dopp, PA on Nov 3 @ 10:15am  Any Other Special Instructions Will Be Listed Below (If Applicable).     If you need a refill on your cardiac medications before your next appointment, please call your pharmacy.

## 2019-05-14 NOTE — Telephone Encounter (Signed)

## 2019-05-15 ENCOUNTER — Telehealth: Payer: Medicare Other | Admitting: Physician Assistant

## 2019-05-30 ENCOUNTER — Encounter: Payer: Self-pay | Admitting: Thoracic Surgery (Cardiothoracic Vascular Surgery)

## 2019-06-09 ENCOUNTER — Other Ambulatory Visit: Payer: Self-pay | Admitting: Cardiovascular Disease

## 2019-06-09 DIAGNOSIS — I119 Hypertensive heart disease without heart failure: Secondary | ICD-10-CM

## 2019-08-12 ENCOUNTER — Other Ambulatory Visit: Payer: Self-pay | Admitting: Cardiovascular Disease

## 2019-08-26 ENCOUNTER — Other Ambulatory Visit: Payer: Self-pay | Admitting: Cardiovascular Disease

## 2019-08-26 ENCOUNTER — Ambulatory Visit: Payer: Medicare Other | Admitting: Physician Assistant

## 2019-08-26 DIAGNOSIS — I480 Paroxysmal atrial fibrillation: Secondary | ICD-10-CM

## 2019-08-27 ENCOUNTER — Other Ambulatory Visit: Payer: Self-pay | Admitting: Cardiovascular Disease

## 2019-08-27 MED ORDER — ROSUVASTATIN CALCIUM 10 MG PO TABS: 10.0000 mg | ORAL_TABLET | ORAL | 2 refills | Status: DC

## 2019-08-27 MED ORDER — LOSARTAN POTASSIUM 50 MG PO TABS: 50.0000 mg | ORAL_TABLET | Freq: Every day | ORAL | 2 refills | Status: DC

## 2019-08-27 NOTE — Telephone Encounter (Signed)
Prescription refill request for Eliquis received.  Last office visit: [name] (05-14-2019) Scr: 1.27 (04-24-2019) Age: 85 y.o. Weight: 89.6 kg  Prescription refill sent.

## 2019-09-01 NOTE — Progress Notes (Signed)
Cardiology Office Note:    Date:  09/02/2019   ID:  [name], DOB 1930/12/01, MRN [id number]  PCP:  Leonard Downing, MD  Cardiologist:  Sherren Mocha, MD  Electrophysiologist:  None   Referring MD: Leonard Downing, *   Chief Complaint  Patient presents with  . Follow-up    Hx of TAVR, CAD, AFib    History of Present Illness:    [name] is a 85 y.o. female with:   Aortic stenosis  S/p TAVR 03/2019  Paroxysmal AFib   CHADS2-VASc=5 (HTN, diab, female, age x 2) >> Apixaban   Coronary artery disease (mild to mod nonobs by cath in 03/2019)  Hypertension   Hyperlipidemia  Diabetes mellitus   Angioedema 2/2 ACE inhibitor (Lisinopril)   [name] was last seen in clinic by Angelena Form, PA-C in 04/2019.  [name] returns for follow-up.  [name] is here alone.  Since last seen, [name] has done well.  [name] has not had chest pain, significant shortness of breath, orthopnea, lower extremity swelling or syncope.  [name] has not had any bleeding issues.    Prior CV studies:   The following studies were reviewed today:    Echocardiogram 05/14/2019 EF 60-65, pseudo-normalization (Gr 2 DD), no RWMA, RVSP 52.9, mod LAE, circumferential eff, mild to mod MR, TAVR ok  Echocardiogram 04/23/2019 EF > 65, pseudonormalization (Gr 2 DD), no RWMA, sever LAE, trivail eff, trivial MR, s/p TAVR with trivial paravalvular regurgitation, mean gradient 22 mmHg, PASP 32  Cardiac catheterization 03/24/2019 OM2 30 RCA 50  Echocardiogram 12/16/2018 Normal LVF, severe AS (mean 40 mmHg), trace AI, mild MR, severe LAE, mod TR mild pulmonary hypertension   Echo 09/24/17 Vigorous LVF, EF 65-70, moderate aortic stenosis (mean 32, peak 59; LVOT/AV mean 0.22), mild MR, mild LAE, PASP 53, small pericardial effusion  Echo 04/09/17 Mild concentric LVH, EF 60-65, normal wall motion, moderate to severe aortic stenosis (mean 32, peak 55; LVOT/AV velocity - 0.17), mild AI, MAC, mild MR,  severe LAE, moderate TR, PASP 50, trivial pericardial effusion  Echo 03/06/16 Mild conc LVH, EF 55-60, no RWMA, Gr 3 DD, mod AS (mena 27), mod AI, mild MR, severe LAE, mild RAE, mild to mod TR, PASP 42  Echo 12/16/14 Mild concentric LVH, EF 60-65%, normal wall motion, grade 2 diastolic dysfunction, moderate to severe aortic stenosis (mean gradient 29 mmHg, peak gradient 57 mmHg), mild AI, MAC, mild MR, mild LAE, mild to moderate TR, PASP 32 mmHg, trivial pericardial effusion   Past Medical History:  Diagnosis Date  . Exogenous obesity   . Hyperlipidemia   . Hypertension   . Hypothyroidism   . 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 Severe aortic stenosis   . Type II diabetes mellitus ([place])   . Wears glasses    Surgical Hx: The patient  has a past surgical history that includes Laparoscopic cholecystectomy (2008); RIGHT/LEFT HEART CATH AND CORONARY ANGIOGRAPHY (N/A, 03/24/2019); Transcatheter aortic valve replacement, transfemoral (N/A, 04/22/2019); and TEE without cardioversion (N/A, 04/22/2019).   Current Medications: Current Meds  Medication Sig  . acetaminophen (TYLENOL) 500 MG tablet Take 500 mg by mouth every 6 (six) hours as needed for mild pain, moderate pain or headache.   Marland Kitchen amLODipine (NORVASC) 10 MG tablet TAKE 1 TABLET BY MOUTH DAILY  . amoxicillin (AMOXIL) 500 MG capsule Take four capsules one hour before  dental appointment.  Marland Kitchen aspirin 81 MG chewable tablet Chew 1 tablet (81 mg total) by mouth daily.  . Calcium Carb-Cholecalciferol (CALCIUM 600 + D PO) Take 1 tablet by mouth daily.  Marland Kitchen ELIQUIS 5 MG TABS tablet TAKE 1 TABLET BY MOUTH TWICE DAILY  . levothyroxine (SYNTHROID, LEVOTHROID) 75 MCG tablet Take 75 mcg by mouth daily.  Marland Kitchen losartan (COZAAR) 50 MG tablet Take 1 tablet (50 mg total) by mouth daily.  . metoprolol tartrate (LOPRESSOR) 25 MG tablet  TAKE 1 TABLET BY MOUTH TWICE DAILY  . omeprazole (PRILOSEC) 40 MG capsule Take 40 mg by mouth as needed.  . rosuvastatin (CRESTOR) 10 MG tablet Take 1 tablet (10 mg total) by mouth every other day.  . spironolactone (ALDACTONE) 25 MG tablet TAKE 1/2 TABLET BY MOUTH DAILY  . [DISCONTINUED] hydrochlorothiazide (HYDRODIURIL) 12.5 MG tablet Take 1 tablet (12.5 mg total) by mouth daily.  . [DISCONTINUED] hydrochlorothiazide (HYDRODIURIL) 25 MG tablet Take 25 mg by mouth as needed.  . [DISCONTINUED] metFORMIN (GLUCOPHAGE) 500 MG tablet Take 500 mg by mouth 2 (two) times daily with a meal.  . [DISCONTINUED] traMADol (ULTRAM) 50 MG tablet Take 50 mg by mouth every 6 (six) hours as needed.     Allergies:   Lisinopril   Social History   Tobacco Use  . Smoking status: Never Smoker  . Smokeless tobacco: Never Used  Substance Use Topics  . Alcohol use: No  . Drug use: No     Family Hx: The patient's family history includes Diabetes in her sister, sister, and sister; Heart attack in her father and sister; Hypertension in her sister; Kidney failure in her sister.  ROS:   Please see the history of present illness.    ROS All other systems reviewed and are negative.   EKGs/Labs/Other Test Reviewed:    EKG:  EKG is npt ordered today.  The ekg ordered today demonstrates n/a  Recent Labs: 04/18/2019: ALT 17; B Natriuretic Peptide 171.4 04/23/2019: Hemoglobin 12.7; Magnesium 1.7; Platelets 183 04/24/2019: BUN 18; Creatinine, Ser 1.27; Potassium 4.4; Sodium 136   Recent Lipid Panel Lab Results  Component Value Date/Time   CHOL 141 08/25/2015 09:35 AM   TRIG 113 08/25/2015 09:35 AM   HDL 52 08/25/2015 09:35 AM   CHOLHDL 2.7 08/25/2015 09:35 AM   LDLCALC 66 08/25/2015 09:35 AM    Physical Exam:    VS:  BP (!) 146/72   Pulse (!) 54   Ht _0  (1.626 m)   Wt 206 lb (93.4 kg)   LMP  (LMP Unknown)   BMI 35.36 kg/m     Wt Readings from Last 3 Encounters:  09/02/19 206 lb (93.4 kg)   05/14/19 197 lb 9.6 oz (89.6 kg)  04/30/19 195 lb (88.5 kg)     Physical Exam  Constitutional: [name] is oriented to person, place, and time. [name] appears well-developed and well-nourished. No distress.  HENT:  Head: Normocephalic and atraumatic.  Eyes: No scleral icterus.  Neck: No JVD present. Carotid bruit is not present. No thyromegaly present.  Cardiovascular: Normal rate and regular rhythm.  Murmur heard.  Early systolic murmur is present with a grade of 1/6 at the upper right sternal border. Pulmonary/Chest: Effort normal and breath sounds normal. [name] has no rales.  Abdominal: Soft. There is no hepatomegaly.  Musculoskeletal:        General: No edema.  Lymphadenopathy:    [name] has no cervical adenopathy.  Neurological: [name] is alert and oriented to person,  place, and time.  Skin: Skin is warm and dry.  Psychiatric: [name] has a normal mood and affect.    ASSESSMENT & PLAN:    1. Aortic valve stenosis, etiology of cardiac valve disease unspecified 2. S/P TAVR (transcatheter aortic valve replacement) Recent echocardiogram in July 2020 demonstrated normal functioning aortic valve prosthesis.  [name] is doing well.  Continue SBE prophylaxis.  Follow-up with Angelena Form, PA-C in June as scheduled and Dr. Burt Knack in 1 year.  3. Paroxysmal atrial fibrillation (HCC) Maintaining sinus rhythm by exam.  [name] is tolerating anticoagulation.  Recent hemoglobin and creatinine normal.  Continue current therapy.  4. Coronary artery disease involving native coronary artery of native heart without angina pectoris Nonobstructive coronary artery disease by cardiac catheterization in June 2020.  [name] is not having anginal symptoms.  Continue statin therapy.  Obtain most recent lipid panel from primary care.  5. Essential hypertension Blood pressure borderline elevated.  I have asked her to continue to monitor blood pressure over the next week and send me those readings for review.  If her pressure  remains above target, consider increasing losartan to 100 mg daily.    Dispo:  Return in about 1 year (around 09/01/2020) for Routine Follow Up, w/ Dr. Burt Knack, (virtual or in-person).   Medication Adjustments/Labs and Tests Ordered: Current medicines are reviewed at length with the patient today.  Concerns regarding medicines are outlined above.  Tests Ordered: No orders of the defined types were placed in this encounter.  Medication Changes: No orders of the defined types were placed in this encounter.   Signed, Richardson Dopp, PA-C  09/02/2019 11:27 AM    [facility] [address], [place], [place]  [address] Phone: [phone]; Fax: [phone]

## 2019-09-02 ENCOUNTER — Ambulatory Visit: Payer: Medicare Other | Admitting: Physician Assistant

## 2019-09-02 ENCOUNTER — Encounter: Payer: Self-pay | Admitting: Physician Assistant

## 2019-09-02 ENCOUNTER — Other Ambulatory Visit: Payer: Self-pay

## 2019-09-02 VITALS — BP 146/72 | HR 54 | Ht 64.0 in | Wt 206.0 lb

## 2019-09-02 DIAGNOSIS — I2510 Atherosclerotic heart disease of native coronary artery without angina pectoris: Secondary | ICD-10-CM

## 2019-09-02 DIAGNOSIS — I350 Nonrheumatic aortic (valve) stenosis: Secondary | ICD-10-CM

## 2019-09-02 DIAGNOSIS — Z952 Presence of prosthetic heart valve: Secondary | ICD-10-CM

## 2019-09-02 DIAGNOSIS — I480 Paroxysmal atrial fibrillation: Secondary | ICD-10-CM | POA: Diagnosis not present

## 2019-09-02 DIAGNOSIS — I10 Essential (primary) hypertension: Secondary | ICD-10-CM

## 2019-09-02 NOTE — Patient Instructions (Signed)
Medication Instructions:  Your physician recommends that you continue on your current medications as directed. Please refer to the Current Medication list given to you today.  *If you need a refill on your cardiac medications before your next appointment, please call your pharmacy*  Lab Work: NONE ORDERED  TODAY   If you have labs (blood work) drawn today and your tests are completely normal, you will receive your results only by: . MyChart Message (if you have MyChart) OR . A paper copy in the mail If you have any lab test that is abnormal or we need to change your treatment, we will call you to review the results.  Testing/Procedures: NONE ORDERED  TODAY   Follow-Up: At [facility], you and your health needs are our priority.  As part of our continuing mission to provide you with exceptional heart care, we have created designated Provider Care Teams.  These Care Teams include your primary Cardiologist (physician) and Advanced Practice Providers (APPs -  Physician Assistants and Nurse Practitioners) who all work together to provide you with the care you need, when you need it.  Your next appointment:   12 months  The format for your next appointment:   In Person  Provider:   You may see Michael Cooper, MD or one of the following Advanced Practice Providers on your designated Care Team:    Scott Weaver, PA-C  Vin Bhagat, PA-C  Janine Hammond, NP   Other Instructions  

## 2020-03-30 ENCOUNTER — Encounter (HOSPITAL_COMMUNITY): Payer: Self-pay

## 2020-03-30 ENCOUNTER — Ambulatory Visit (HOSPITAL_COMMUNITY)
Admission: EM | Admit: 2020-03-30 | Discharge: 2020-03-30 | Disposition: A | Discharge status: Home / Self Care | Payer: Medicare Other | Attending: Family Medicine | Admitting: Family Medicine

## 2020-03-30 ENCOUNTER — Ambulatory Visit (INDEPENDENT_AMBULATORY_CARE_PROVIDER_SITE_OTHER): Payer: Medicare Other

## 2020-03-30 ENCOUNTER — Other Ambulatory Visit: Payer: Self-pay

## 2020-03-30 DIAGNOSIS — M25511 Pain in right shoulder: Secondary | ICD-10-CM

## 2020-03-30 NOTE — ED Triage Notes (Signed)
Patient reports she tripped earlier today and fell on her right shoulder. Unable to abduct shoulder joint.
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
## 2020-03-30 NOTE — Discharge Instructions (Signed)
Take tylenol for pain use ice or heat to area Wear sling for next several days Do motion exercised 2 x a day like I showed you Call your doctor for follow up

## 2020-04-12 ENCOUNTER — Other Ambulatory Visit: Payer: Self-pay | Admitting: Cardiovascular Disease

## 2020-04-12 DIAGNOSIS — I480 Paroxysmal atrial fibrillation: Secondary | ICD-10-CM

## 2020-04-12 NOTE — Telephone Encounter (Signed)
Afib indication, age 86, weight 93kg, SCr 1.27 checked 04/24/19, pt has appt in 1-2 weeks, will recheck BMET and CBC at that time. Currently on correct dose, will send in refill and reassess after updated labs come back.

## 2020-04-20 NOTE — Progress Notes (Signed)
[facility]   MULTIDISCIPLINARY HEART VALVE CLINIC                                       Cardiology Office Note    Date:  04/22/2020   ID:  [name], DOB 03-28-1931, MRN [id number]  PCP:  Kaleen Mask, MD  Cardiologist: Dr. Excell Seltzer / Dr. Excell Seltzer & Cornelius Moras (TAVR)  CC: 1 year s/p TAVR   History of Present Illness:  [name] is a 86 y.o. female with a history of HTN, PAF on Eliquis, DMT2, HLD and severe AS s/p TAVR (04/22/19) who presents to clinic for follow up.   She was evaluated by the multidisciplinary valve team and underwent successful TAVR with a 23 mm Edwards Sapien THV via the TF approach on 04/22/19. Post op echo showed normally functioning TAVR with elevated mean gradient of 34mm hg and trivial PVL. She was discharged on aspirin and Eliquis. 14month echo showed EF 60%, normally functioning TAVR with a mean gradient of 11 mmHg and no PVL, and mod pulm HTN.   She was seen by Tereso Newcomer PA-C in the office in 08/2019 and doing well.   About 3 weeks ago she had a mechanical fall going up the door steps and hit her shoulder. She was evaluated in the ER and was discharged home.   Today she presents to clinic for follow up. No CP or SOB. No LE edema, orthopnea or PND. No dizziness or syncope. No blood in stool or urine. No palpitations.    Past Medical History:  Diagnosis Date  . Exogenous obesity   . Hyperlipidemia   . Hypertension   . Hypothyroidism   . 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 Severe aortic stenosis   . Type II diabetes mellitus (HCC)   . Wears glasses     Past Surgical History:  Procedure Laterality Date  . LAPAROSCOPIC CHOLECYSTECTOMY  2008  . RIGHT/LEFT HEART CATH AND CORONARY ANGIOGRAPHY N/A 03/24/2019   Procedure: RIGHT/LEFT HEART CATH AND CORONARY ANGIOGRAPHY;  Surgeon: Tonny Bollman,  MD;  Location: [facility] INVASIVE CV LAB;  Service: Cardiovascular;  Laterality: N/A;  . TEE WITHOUT CARDIOVERSION N/A 04/22/2019   Procedure: TRANSESOPHAGEAL ECHOCARDIOGRAM (TEE);  Surgeon: Tonny Bollman, MD;  Location: [facility] INVASIVE CV LAB;  Service: Open Heart Surgery;  Laterality: N/A;  . TRANSCATHETER AORTIC VALVE REPLACEMENT, TRANSFEMORAL N/A 04/22/2019   Procedure: TRANSCATHETER AORTIC VALVE REPLACEMENT, TRANSFEMORAL;  Surgeon: Tonny Bollman, MD;  Location: [facility] INVASIVE CV LAB;  Service: Open Heart Surgery;  Laterality: N/A;    Current Medications: Outpatient Medications Prior to Visit  Medication Sig Dispense Refill  . acetaminophen (TYLENOL) 500 MG tablet Take 500 mg by mouth every 6 (six) hours as needed for mild pain, moderate pain or headache.     Marland Kitchen amLODipine (NORVASC) 10 MG tablet TAKE 1 TABLET BY MOUTH DAILY 30 tablet 11  . Calcium Carb-Cholecalciferol (CALCIUM 600 + D PO) Take 1 tablet by mouth daily.    Marland Kitchen ELIQUIS 5 MG TABS tablet TAKE 1 TABLET BY MOUTH TWICE DAILY 180 tablet 0  . levothyroxine (SYNTHROID, LEVOTHROID) 75 MCG tablet Take 75 mcg by mouth daily.    Marland Kitchen losartan (COZAAR) 50 MG tablet Take 1 tablet (50 mg total) by mouth daily. 90 tablet  2  . metoprolol tartrate (LOPRESSOR) 25 MG tablet TAKE 1 TABLET BY MOUTH TWICE DAILY 180 tablet 3  . omeprazole (PRILOSEC) 40 MG capsule Take 40 mg by mouth as needed.    . rosuvastatin (CRESTOR) 10 MG tablet Take 1 tablet (10 mg total) by mouth every other day. 45 tablet 2  . spironolactone (ALDACTONE) 25 MG tablet TAKE 1/2 TABLET BY MOUTH DAILY 45 tablet 3  . amoxicillin (AMOXIL) 500 MG capsule Take four capsules one hour before dental appointment. 4 capsule 1  . aspirin 81 MG chewable tablet Chew 1 tablet (81 mg total) by mouth daily. 30 tablet 3   No facility-administered medications prior to visit.     Allergies:   Lisinopril   Social History   Socioeconomic History  . Marital status: Widowed    Spouse name: Not on file  .  Number of children: Not on file  . Years of education: Not on file  . Highest education level: Not on file  Occupational History  . Not on file  Tobacco Use  . Smoking status: Never Smoker  . Smokeless tobacco: Never Used  Vaping Use  . Vaping Use: Never used  Substance and Sexual Activity  . Alcohol use: No  . Drug use: No  . Sexual activity: Not on file  Other Topics Concern  . Not on file  Social History Narrative   6 grandkids   4 great grandkids   2 great great grandkids   Social Determinants of Corporate investment banker Strain:   . Difficulty of Paying Living Expenses:   Food Insecurity:   . Worried About Programme researcher, broadcasting/film/video in the Last Year:   . Barista in the Last Year:   Transportation Needs:   . Freight forwarder (Medical):   Marland Kitchen Lack of Transportation (Non-Medical):   Physical Activity:   . Days of Exercise per Week:   . Minutes of Exercise per Session:   Stress:   . Feeling of Stress :   Social Connections:   . Frequency of Communication with Friends and Family:   . Frequency of Social Gatherings with Friends and Family:   . Attends Religious Services:   . Active Member of Clubs or Organizations:   . Attends Banker Meetings:   Marland Kitchen Marital Status:      Family History:  The patient's family history includes Diabetes in her sister, sister, and sister; Heart attack in her father and sister; Hypertension in her sister; Kidney failure in her sister.      ROS:   Please see the history of present illness.    ROS All other systems reviewed and are negative.   PHYSICAL EXAM:   VS:  BP 134/68   Pulse 64   Ht 5\' 4"  (1.626 m)   Wt 207 lb (93.9 kg)   LMP  (LMP Unknown)   SpO2 98%   BMI 35.53 kg/m    GEN: Well nourished, well developed, in no acute distress HEENT: normal Neck: no JVD or masses Cardiac: RRR; 2/6 SEM @ LUSB. No rubs, or gallops, trace bilateral LE edema  Respiratory:  clear to auscultation bilaterally, normal work  of breathing GI: soft, nontender, nondistended, + BS MS: no deformity or atrophy Skin: warm and dry, no rash Neuro:  Alert and Oriented x 3, Strength and sensation are intact Psych: euthymic mood, full affect   Wt Readings from Last 3 Encounters:  04/21/20 207 lb (93.9 kg)  09/02/19 206 lb (93.4 kg)  05/14/19 197 lb 9.6 oz (89.6 kg)      Studies/Labs Reviewed:   EKG:  EKG is ordered today. Sinus with TWI in lateral leads. HR 64    Recent Labs: 04/21/2020: BUN 24; Creatinine, Ser 1.46; Hemoglobin 13.4; Platelets 198; Potassium 4.3; Sodium 137   Lipid Panel    Component Value Date/Time   CHOL 141 08/25/2015 0935   TRIG 113 08/25/2015 0935   HDL 52 08/25/2015 0935   CHOLHDL 2.7 08/25/2015 0935   VLDL 23 08/25/2015 0935   LDLCALC 66 08/25/2015 0935    Additional studies/ records that were reviewed today include:  TAVR: 04/22/19  Procedure:   Transcatheter Aortic Valve Replacement - Percutaneous Transfemoral Approach Edwards Sapien 3 THV (size 23mm, model # 9600TFX, serial # [id number])  Co-Surgeons:Clarence H. Cornelius Moraswen, MD and Tonny BollmanMichael Cooper, MD  Anesthesiologist:Ryan Bradley FerrisEllender, MD  Echocardiographer:Mihai Croitoru, MD  Pre-operative Echo Findings: ? Severe aortic stenosis ? Normalleft ventricular systolic function  Post-operative Echo Findings: ? Noparavalvular leak ? Normalleft ventricular systolic function  _____________   Echo 04/23/19 IMPRESSIONS 1. The left ventricle has hyperdynamic systolic function, with an ejection fraction of >65%. The cavity size was normal. Left ventricular diastolic Doppler parameters are consistent with pseudonormalization. No evidence of left ventricular regional wall motion abnormalities. 2. The right ventricle has normal systolic function. The cavity was normal. There is no increase in right ventricular wall thickness. 3. Left atrial  size was severely dilated. 4. Trivial pericardial effusion is present. 5. No evidence of mitral valve stenosis. Trivial mitral regurgitation. 6. Bioprosthetic aortic valve s/p TAVR. Trivial peri-valvular regurgitation. Mean gradient is elevated at 22 mmHg. AVA 1.68 cm^2. 7. The aortic root is normal in size and structure. 8. Normal IVC size. PA systolic pressure 32 mmHg.  _____________  Echo 05/14/19 IMPRESSIONS    1. The left ventricle has normal systolic function with an ejection fraction of 60-65%. The cavity size was normal. Left ventricular diastolic Doppler parameters are consistent with pseudonormalization. Elevated mean left atrial pressure No evidence of  left ventricular regional wall motion abnormalities.  2. The right ventricle has normal systolic function. The cavity was normal. There is no increase in right ventricular wall thickness. Right ventricular systolic pressure is moderately elevated with an estimated pressure of 52.9 mmHg.  3. Left atrial size was moderately dilated.  4. The pericardial effusion is circumferential.  5. Trivial pericardial effusion is present.  6. Mild thickening of the mitral valve leaflet. Mitral valve regurgitation is mild to moderate by color flow Doppler. The MR jet is centrally-directed.  7. A 23mm an Carolin GuernseyEdwards Edwards Sapien bioprosthetic aortic valve (TAVR) valve is present in the aortic position. Procedure Date: 04/22/19.  8. The aorta is normal in size and structure.  9. The inferior vena cava was dilated in size with >50% respiratory variability.  __________________  Echo 04/21/2020 IMPRESSIONS    1. Left ventricular ejection fraction, by estimation, is 65 to 70%. The left ventricle has normal function. The left ventricle has no regional wall motion abnormalities. Left ventricular diastolic parameters are consistent with Grade II diastolic  dysfunction (pseudonormalization). Elevated left atrial pressure.  2. Right ventricular  systolic function is normal. The right ventricular size is normal. There is moderately elevated pulmonary artery systolic pressure.  3. Left atrial size was moderately dilated.  4. The mitral valve is normal in structure. Mild mitral valve regurgitation.  5. The aortic valve has been repaired/replaced. Aortic valve regurgitation is mild. There is  a 23 mm Edwards Sapien prosthetic (TAVR) valve present in the aortic position. Echo findings are consistent with perivalvular leak of the aortic prosthesis.  Aortic valve mean gradient measures 13.0 mmHg. Aortic valve Vmax measures 2.64 m/s.  6. The inferior vena cava is normal in size with greater than 50% respiratory variability, suggesting right atrial pressure of 3 mmHg.  Comparison(s): Prior images reviewed side by side. There is new mild perivalvular leak at the lateral aspect of the annulus, towards the left coronary ostium. This was not appreciated on the postop images on 04/22/2019 or on the follow up images from  07/01/202 or 05/14/2019. Prosthetic valve gradients are not significantly changed.  Conclusion(s)/Recommendation(s): The aortic valve area and dimensionless index could not be accurately estimated on the current study due to erroneous location of LVOT pulsed Doppler sampling.  ASSESSMENT & PLAN:   Severe AS s/p TAVR: echo today shows EF 60%, normally functioning TAVR with a mean gradient of 13 mmHg and new mild perivalvular leak at the lateral aspect of the annulus, towards the left coronary ostium. She has NYHA class I symptoms. SBE prophylaxis discussed; I have RX'd amoxicillin. Continue on Eliquis alone. She will continue regular follow up with Dr. Excell Seltzer.   HTN: BP 134/68. No changes made. Continue current medications.   PAF: maintaining sinus today. Will check routine labs   Medication Adjustments/Labs and Tests Ordered: Current medicines are reviewed at length with the patient today.  Concerns regarding medicines are  outlined above.  Medication changes, Labs and Tests ordered today are listed in the Patient Instructions below. Patient Instructions  Medication Instructions:  Your provider recommends that you continue on your current medications as directed. Please refer to the Current Medication list given to you today.   *If you need a refill on your cardiac medications before your next appointment, please call your pharmacy*  Lab Work: TODAY! BMET, CBC If you have labs (blood work) drawn today and your tests are completely normal, you will receive your results only by: Marland Kitchen MyChart Message (if you have MyChart) OR . A paper copy in the mail If you have any lab test that is abnormal or we need to change your treatment, we will call you to review the results.  Follow-Up: At [facility], you and your health needs are our priority.  As part of our continuing mission to provide you with exceptional heart care, we have created designated Provider Care Teams.  These Care Teams include your primary Cardiologist (physician) and Advanced Practice Providers (APPs -  Physician Assistants and Nurse Practitioners) who all work together to provide you with the care you need, when you need it. Your next appointment:   12 month(s) The format for your next appointment:   In Person Provider:   You may see Tonny Bollman, MD or one of the following Advanced Practice Providers on your designated Care Team:    Tereso Newcomer, PA-C  Chelsea Aus, PA-C      Signed, Cline Crock, [place]  04/22/2020 10:44 AM    [facility] [address] [place], [place], [place]  [address] Phone: [phone]; Fax: [phone]

## 2020-04-21 ENCOUNTER — Encounter: Payer: Self-pay | Admitting: Physician Assistant

## 2020-04-21 ENCOUNTER — Ambulatory Visit (INDEPENDENT_AMBULATORY_CARE_PROVIDER_SITE_OTHER): Payer: Medicare Other | Admitting: Physician Assistant

## 2020-04-21 ENCOUNTER — Ambulatory Visit (HOSPITAL_COMMUNITY): Payer: Medicare Other | Attending: Cardiovascular Disease

## 2020-04-21 ENCOUNTER — Other Ambulatory Visit: Payer: Self-pay

## 2020-04-21 VITALS — BP 134/68 | HR 64 | Ht 64.0 in | Wt 207.0 lb

## 2020-04-21 DIAGNOSIS — I10 Essential (primary) hypertension: Secondary | ICD-10-CM

## 2020-04-21 DIAGNOSIS — Z952 Presence of prosthetic heart valve: Secondary | ICD-10-CM

## 2020-04-21 DIAGNOSIS — I480 Paroxysmal atrial fibrillation: Secondary | ICD-10-CM

## 2020-04-21 NOTE — Patient Instructions (Signed)
Medication Instructions:  Your provider recommends that you continue on your current medications as directed. Please refer to the Current Medication list given to you today.   *If you need a refill on your cardiac medications before your next appointment, please call your pharmacy*  Lab Work: TODAY! BMET, CBC If you have labs (blood work) drawn today and your tests are completely normal, you will receive your results only by: Marland Kitchen MyChart Message (if you have MyChart) OR . A paper copy in the mail If you have any lab test that is abnormal or we need to change your treatment, we will call you to review the results.  Follow-Up: At [facility], you and your health needs are our priority.  As part of our continuing mission to provide you with exceptional heart care, we have created designated Provider Care Teams.  These Care Teams include your primary Cardiologist (physician) and Advanced Practice Providers (APPs -  Physician Assistants and Nurse Practitioners) who all work together to provide you with the care you need, when you need it. Your next appointment:   12 month(s) The format for your next appointment:   In Person Provider:   You may see Tonny Bollman, MD or one of the following Advanced Practice Providers on your designated Care Team:    Tereso Newcomer, PA-C  [place], [place]

## 2020-04-22 ENCOUNTER — Other Ambulatory Visit: Payer: Self-pay | Admitting: Physician Assistant

## 2020-04-22 LAB — CBC WITH DIFFERENTIAL/PLATELET
Basophils Absolute: 0.1 10*3/uL (ref 0.0–0.2)
Basos: 1 %
EOS (ABSOLUTE): 0.1 10*3/uL (ref 0.0–0.4)
Eos: 1 %
Hematocrit: 40.8 % (ref 34.0–46.6)
Hemoglobin: 13.4 g/dL (ref 11.1–15.9)
Immature Grans (Abs): 0 10*3/uL (ref 0.0–0.1)
Immature Granulocytes: 0 %
Lymphocytes Absolute: 2.1 10*3/uL (ref 0.7–3.1)
Lymphs: 34 %
MCH: 29.6 pg (ref 26.6–33.0)
MCHC: 32.8 g/dL (ref 31.5–35.7)
MCV: 90 fL (ref 79–97)
Monocytes Absolute: 0.8 10*3/uL (ref 0.1–0.9)
Monocytes: 12 %
Neutrophils Absolute: 3.2 10*3/uL (ref 1.4–7.0)
Neutrophils: 52 %
Platelets: 198 10*3/uL (ref 150–450)
RBC: 4.53 x10E6/uL (ref 3.77–5.28)
RDW: 12.5 % (ref 11.7–15.4)
WBC: 6.3 10*3/uL (ref 3.4–10.8)

## 2020-04-22 LAB — BASIC METABOLIC PANEL
BUN/Creatinine Ratio: 16 (ref 12–28)
BUN: 24 mg/dL (ref 8–27)
CO2: 24 mmol/L (ref 20–29)
Calcium: 10.1 mg/dL (ref 8.7–10.3)
Chloride: 100 mmol/L (ref 96–106)
Creatinine, Ser: 1.46 mg/dL — ABNORMAL HIGH (ref 0.57–1.00)
GFR calc Af Amer: 37 mL/min/{1.73_m2} — ABNORMAL LOW (ref >59)
GFR calc non Af Amer: 32 mL/min/{1.73_m2} — ABNORMAL LOW (ref >59)
Glucose: 124 mg/dL — ABNORMAL HIGH (ref 65–99)
Potassium: 4.3 mmol/L (ref 3.5–5.2)
Sodium: 137 mmol/L (ref 134–144)

## 2020-04-22 MED ORDER — AMOXICILLIN 500 MG PO CAPS: ORAL_CAPSULE | ORAL | 12 refills | Status: DC

## 2020-04-23 ENCOUNTER — Telehealth: Payer: Self-pay

## 2020-04-23 DIAGNOSIS — I480 Paroxysmal atrial fibrillation: Secondary | ICD-10-CM

## 2020-04-23 NOTE — Telephone Encounter (Signed)
-----   Message from Janetta Hora, [place] sent at 04/22/2020 10:28 AM EDT ----- Discussed with Margaretmary Dys RPH and serum creat cut off is 1.5 not 1.4, so pt qualifies for 5mg  dosing still. Plan to repeat BMET in 3 months since she is so close to the cut off.

## 2020-04-23 NOTE — Telephone Encounter (Signed)
Reviewed results with patient who verbalized understanding.   Repeat BMET scheduled 10/1. The patient was grateful for call and agrees with treatment plan.

## 2020-05-13 ENCOUNTER — Other Ambulatory Visit: Payer: Self-pay | Admitting: Cardiovascular Disease

## 2020-06-14 ENCOUNTER — Other Ambulatory Visit: Payer: Self-pay | Admitting: Cardiovascular Disease

## 2020-07-19 ENCOUNTER — Other Ambulatory Visit: Payer: Self-pay | Admitting: Cardiovascular Disease

## 2020-07-19 DIAGNOSIS — I480 Paroxysmal atrial fibrillation: Secondary | ICD-10-CM

## 2020-07-19 DIAGNOSIS — I119 Hypertensive heart disease without heart failure: Secondary | ICD-10-CM

## 2020-07-19 NOTE — Telephone Encounter (Signed)
Pt's age 86, wt 93.9 kg, SCr 1.46, CrCl 40.24, last ov w/ KT 04/21/20.

## 2020-07-23 ENCOUNTER — Other Ambulatory Visit: Payer: Medicare Other | Admitting: *Deleted

## 2020-07-23 ENCOUNTER — Telehealth: Payer: Self-pay

## 2020-07-23 ENCOUNTER — Other Ambulatory Visit: Payer: Self-pay

## 2020-07-23 DIAGNOSIS — I480 Paroxysmal atrial fibrillation: Secondary | ICD-10-CM

## 2020-07-23 LAB — BASIC METABOLIC PANEL
BUN/Creatinine Ratio: 14 (ref 12–28)
BUN: 23 mg/dL (ref 8–27)
CO2: 21 mmol/L (ref 20–29)
Calcium: 10.2 mg/dL (ref 8.7–10.3)
Chloride: 100 mmol/L (ref 96–106)
Creatinine, Ser: 1.66 mg/dL — ABNORMAL HIGH (ref 0.57–1.00)
GFR calc Af Amer: 32 mL/min/{1.73_m2} — ABNORMAL LOW (ref >59)
GFR calc non Af Amer: 28 mL/min/{1.73_m2} — ABNORMAL LOW (ref >59)
Glucose: 167 mg/dL — ABNORMAL HIGH (ref 65–99)
Potassium: 4.7 mmol/L (ref 3.5–5.2)
Sodium: 139 mmol/L (ref 134–144)

## 2020-07-23 MED ORDER — APIXABAN 2.5 MG PO TABS: 2.5000 mg | ORAL_TABLET | Freq: Two times a day (BID) | ORAL | 3 refills | Status: DC

## 2020-07-23 NOTE — Telephone Encounter (Signed)
The patient has been notified of the result and verbalized understanding.  All questions (if any) were answered. Theresia Majors, RN 07/23/2020 5:00 PM

## 2020-07-23 NOTE — Telephone Encounter (Signed)
-----   Message from Janetta Hora, [place] sent at 07/23/2020  4:57 PM EDT ----- Based on these labs she should be on the lower dose of Eliquis 2.5mg  BID. She can cut the pills in half If needed.

## 2020-08-18 ENCOUNTER — Other Ambulatory Visit: Payer: Self-pay | Admitting: Physician Assistant

## 2020-09-09 IMAGING — CT CT HEAR MORPH WITH CTA COR WITH SCORE WITH CA WITH CONTRAST AND
2 of 10 series · 8 of 20 positions shown, 9 images · IV contrast (APPLIED)
Comparison: None.

Addendum:
CLINICAL DATA: 87-year-old female with severe aortic stenosis being
evaluated for a TAVR procedure.

EXAM:
Cardiac TAVR CT
TECHNIQUE: The patient was scanned on a Phillips Force scanner. A 120 kV
retrospective scan was triggered in the descending thoracic aorta at
111 HU's. Gantry rotation speed was 250 msecs and collimation was .6
mm. No beta blockade or nitro were given. The 3D data set was
reconstructed in 5% intervals of the R-R cycle. Systolic and
diastolic phases were analyzed on a dedicated work station using
MPR, MIP and VRT modes. The patient received 80 cc of contrast.

[Series 8: 0-90% · axial · 0.39mm/px · z∈[+1287,+1431]mm · 4 of 3990 slices shown, 5 images]
[im 798/3990  vessel]
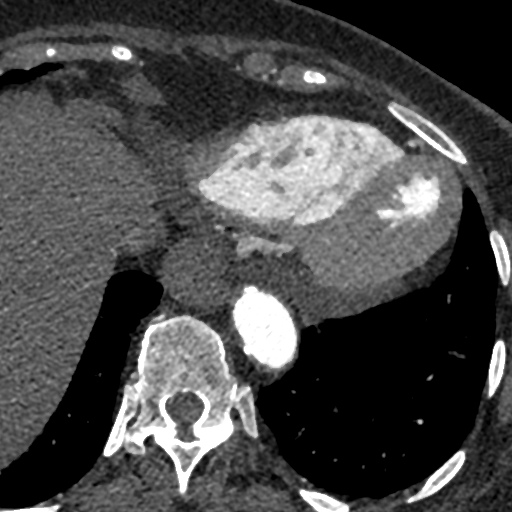
[im 798/3990  lung]
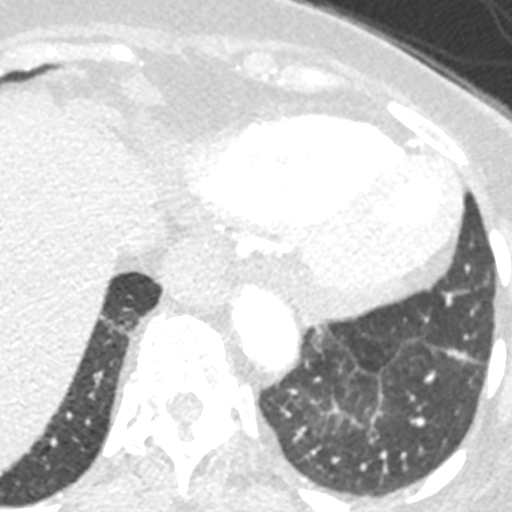
[im 1596/3990  vessel]
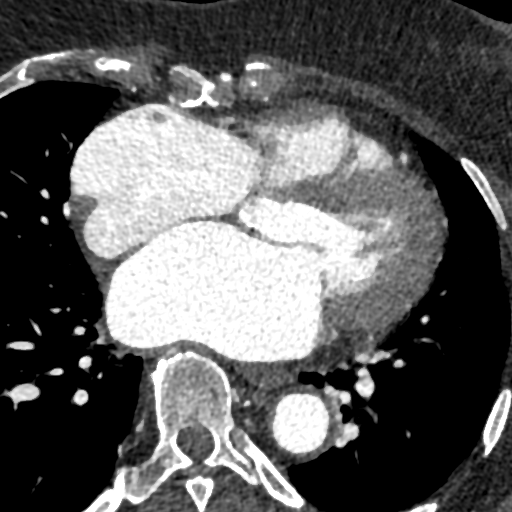
[im 2394/3990  vessel]
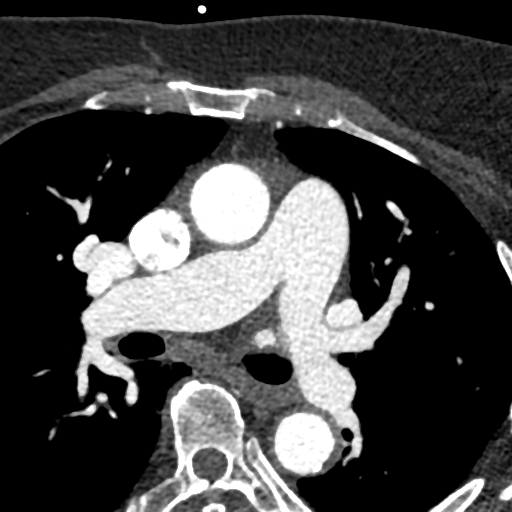
[im 3192/3990  vessel]
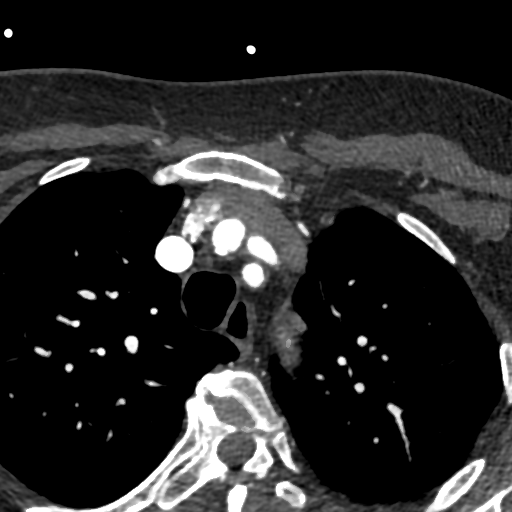

[Series 9: 5-95% · axial · 0.39mm/px · z∈[+1287,+1431]mm · 4 of 3990 slices shown]
[im 798/3990  vessel]
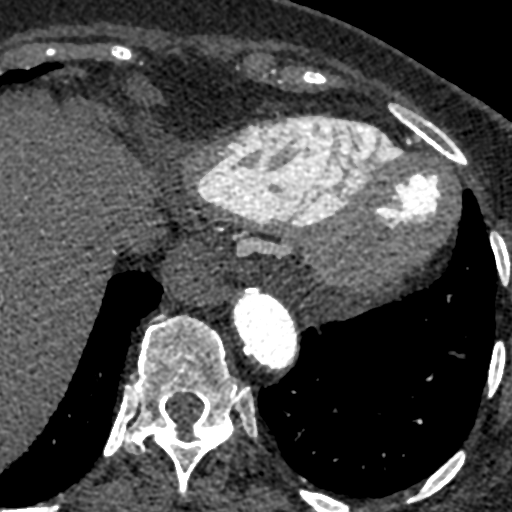
[im 1596/3990  vessel]
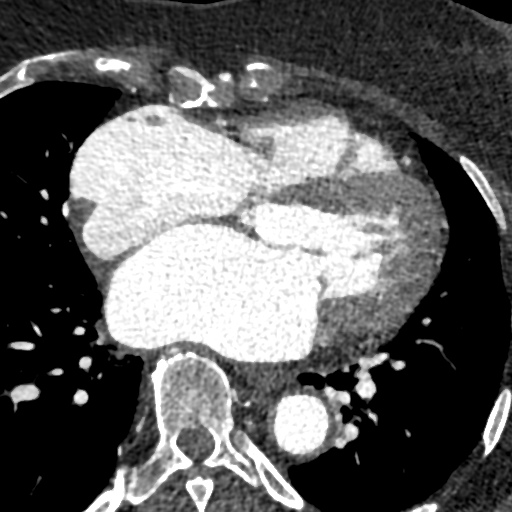
[im 2394/3990  vessel]
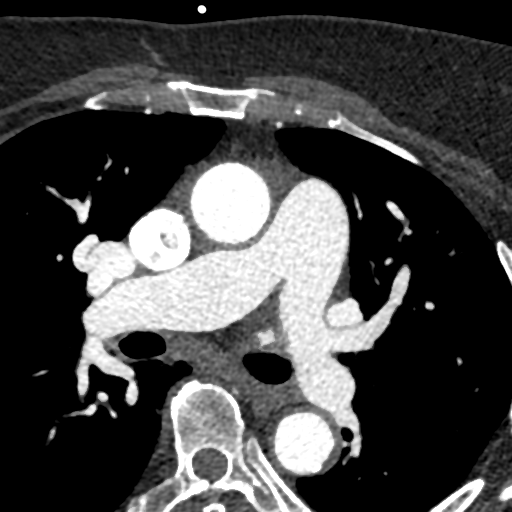
[im 3192/3990  vessel]
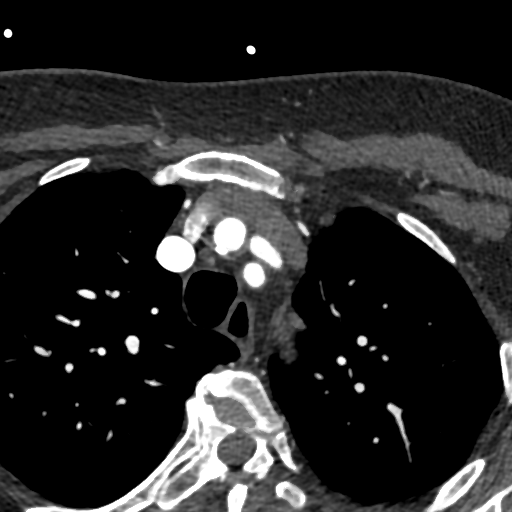

[8 of 20 positions shown; findings below may reference images not displayed]

FINDINGS: Aortic Valve: Trileaflet aortic valve with severely thickened and
calcified leaflets and minimal calcifications extending into the
LVOT.

Aorta: Normal size with mild diffuse atherosclerotic plaque and
calcifications and no dissection.

Sinotubular Junction: 27 x 26 mm

Ascending Thoracic Aorta: 33 x 32 mm

Aortic Arch: 26 x 26 mm

Descending Thoracic Aorta: 23 x 23 mm

Sinus of Valsalva Measurements:

Non-coronary:  29 mm

Right -coronary:  31 mm

Left -coronary:  32 mm

Coronary Artery Height above Annulus:

Left Main: 9.6 mm

Right Coronary: 11.7 mm

Virtual Basal Annulus Measurements:

Maximum/Minimum Diameter: 25.6 x 21.1 mm

Mean Diameter: 22.5 mm

Perimeter: 72.9 mm

Area: 396 mm2

Optimum Fluoroscopic Angle for Delivery:  LAO 6 GNJHIJHIJCFUH 8.
IMPRESSION: 1. Trileaflet aortic valve with severely thickened and calcified
leaflets and minimal calcifications extending into the LVOT. Annular
measurements are suitable for delivery of a 23 mm Edwards-SAPIEN 3
valve.

2. Aortic valve calcium score is 9972 and consistent with severe
aortic stenosis.

3. Sufficient coronary to annulus distance.

4. Optimum Fluoroscopic Angle for Delivery: LAO 6 GNJHIJHIJCFUH 8.

5. No thrombus in the left atrial appendage.

6. Moderately dilated pulmonary artery consistent with pulmonary
hypertension.

EXAM:
OVER-READ INTERPRETATION  CT CHEST

The following report is an over-read performed by radiologist Dr.
over-read does not include interpretation of cardiac or coronary
anatomy or pathology. The coronary calcium score and cardiac CTA
interpretation by the cardiologist is attached.
FINDINGS: Extracardiac findings will be described separately under dictation
for contemporaneously obtained CTA chest, abdomen and pelvis.
IMPRESSION: Please see separate dictation for contemporaneously obtained CTA
chest, abdomen and pelvis 03/31/2019 for full description of
relevant extracardiac findings.

*** End of Addendum ***
FINDINGS: Aortic Valve: Trileaflet aortic valve with severely thickened and
calcified leaflets and minimal calcifications extending into the
LVOT.

Aorta: Normal size with mild diffuse atherosclerotic plaque and
calcifications and no dissection.

Sinotubular Junction: 27 x 26 mm

Ascending Thoracic Aorta: 33 x 32 mm

Aortic Arch: 26 x 26 mm

Descending Thoracic Aorta: 23 x 23 mm

Sinus of Valsalva Measurements:

Non-coronary:  29 mm

Right -coronary:  31 mm

Left -coronary:  32 mm

Coronary Artery Height above Annulus:

Left Main: 9.6 mm

Right Coronary: 11.7 mm

Virtual Basal Annulus Measurements:

Maximum/Minimum Diameter: 25.6 x 21.1 mm

Mean Diameter: 22.5 mm

Perimeter: 72.9 mm

Area: 396 mm2

Optimum Fluoroscopic Angle for Delivery:  LAO 6 GNJHIJHIJCFUH 8.
IMPRESSION: 1. Trileaflet aortic valve with severely thickened and calcified
leaflets and minimal calcifications extending into the LVOT. Annular
measurements are suitable for delivery of a 23 mm Edwards-SAPIEN 3
valve.

2. Aortic valve calcium score is 9972 and consistent with severe
aortic stenosis.

3. Sufficient coronary to annulus distance.

4. Optimum Fluoroscopic Angle for Delivery: LAO 6 GNJHIJHIJCFUH 8.

5. No thrombus in the left atrial appendage.

6. Moderately dilated pulmonary artery consistent with pulmonary
hypertension.

## 2020-09-27 IMAGING — CR CHEST - 2 VIEW
2 series · 2 of 2 positions shown · non-contrast
Comparison: Chest x-ray 04/03/2016, CT 03/31/2019

CLINICAL DATA: 87-year-old female with upcoming TAVR

EXAM:
CHEST - 2 VIEW

[w chest pa]
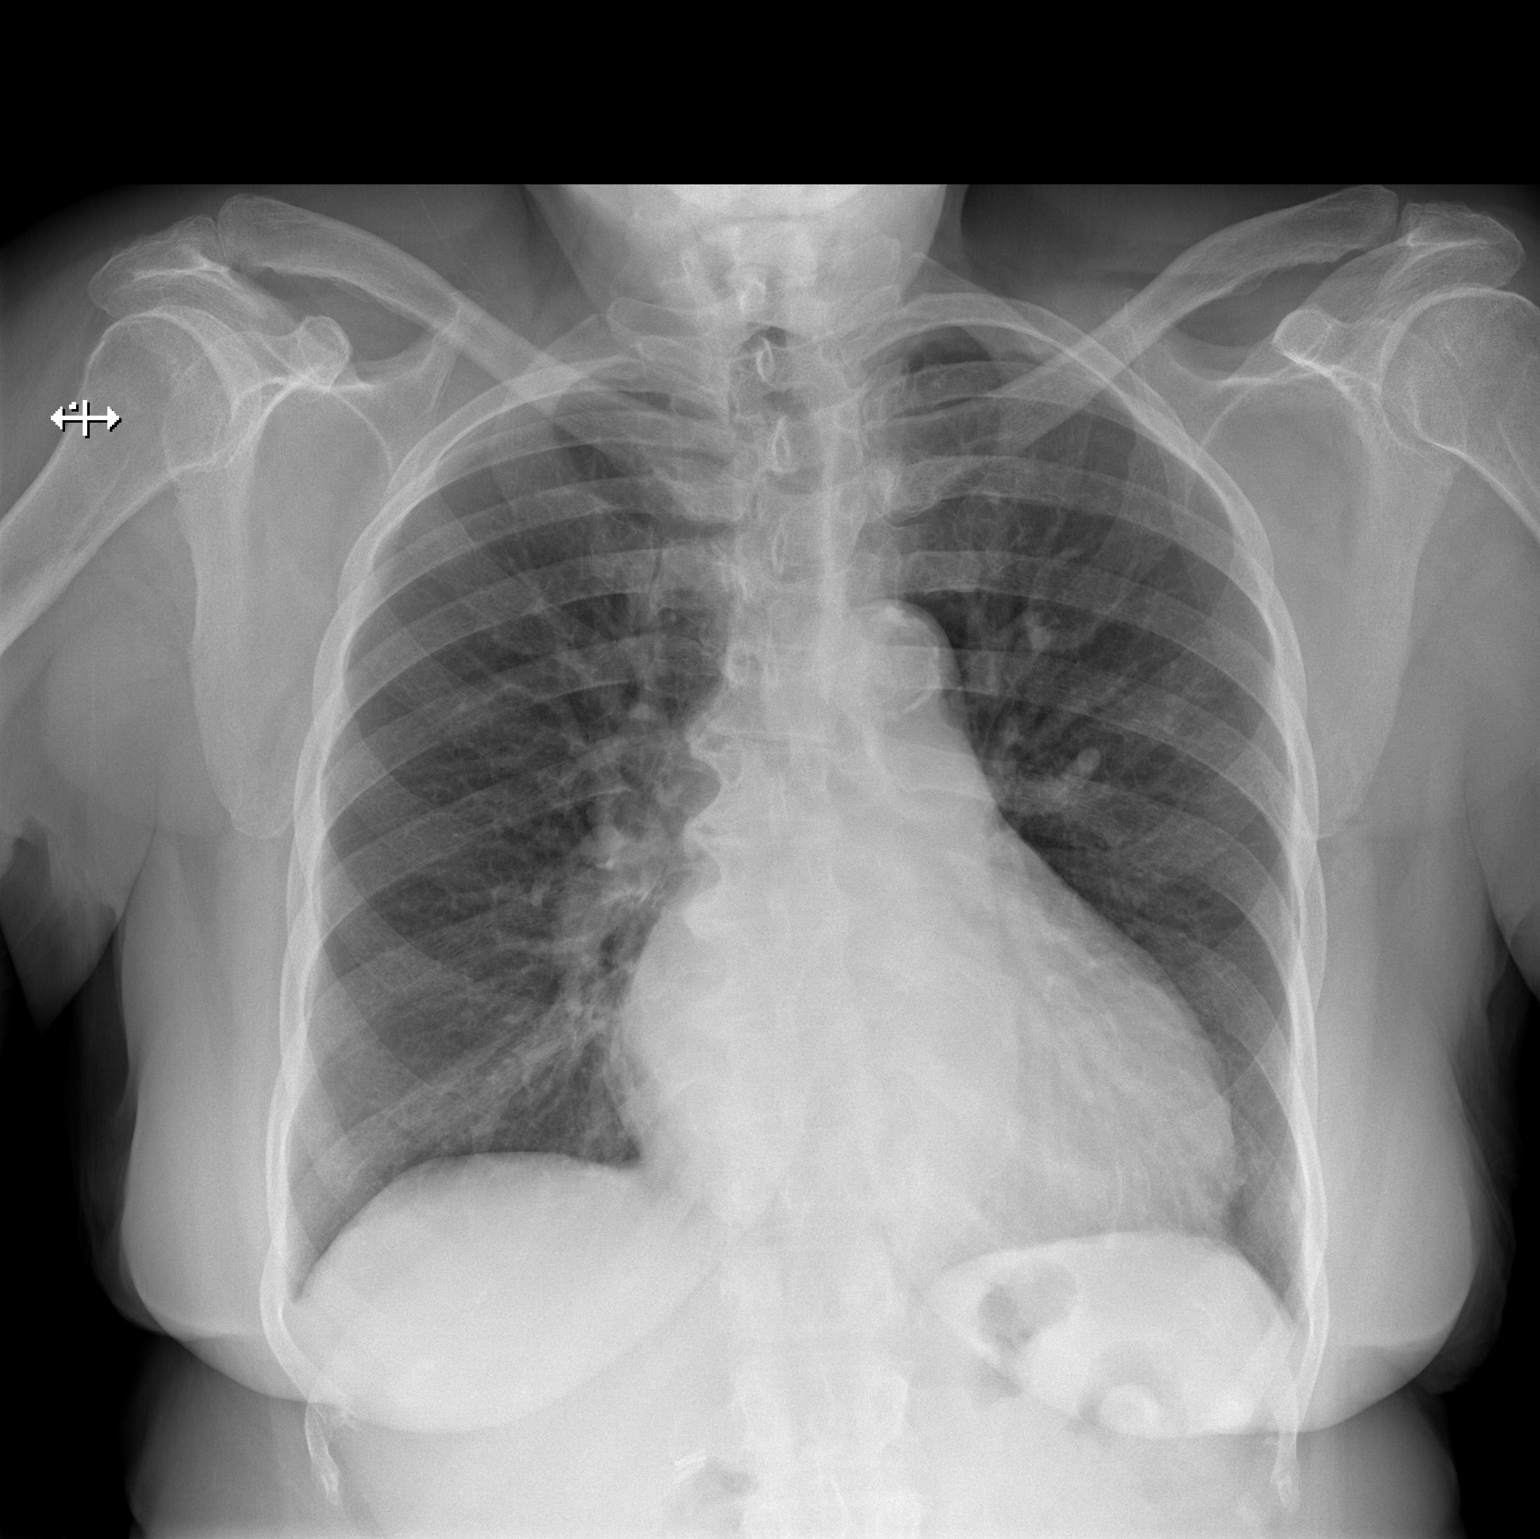

[w chest lat]
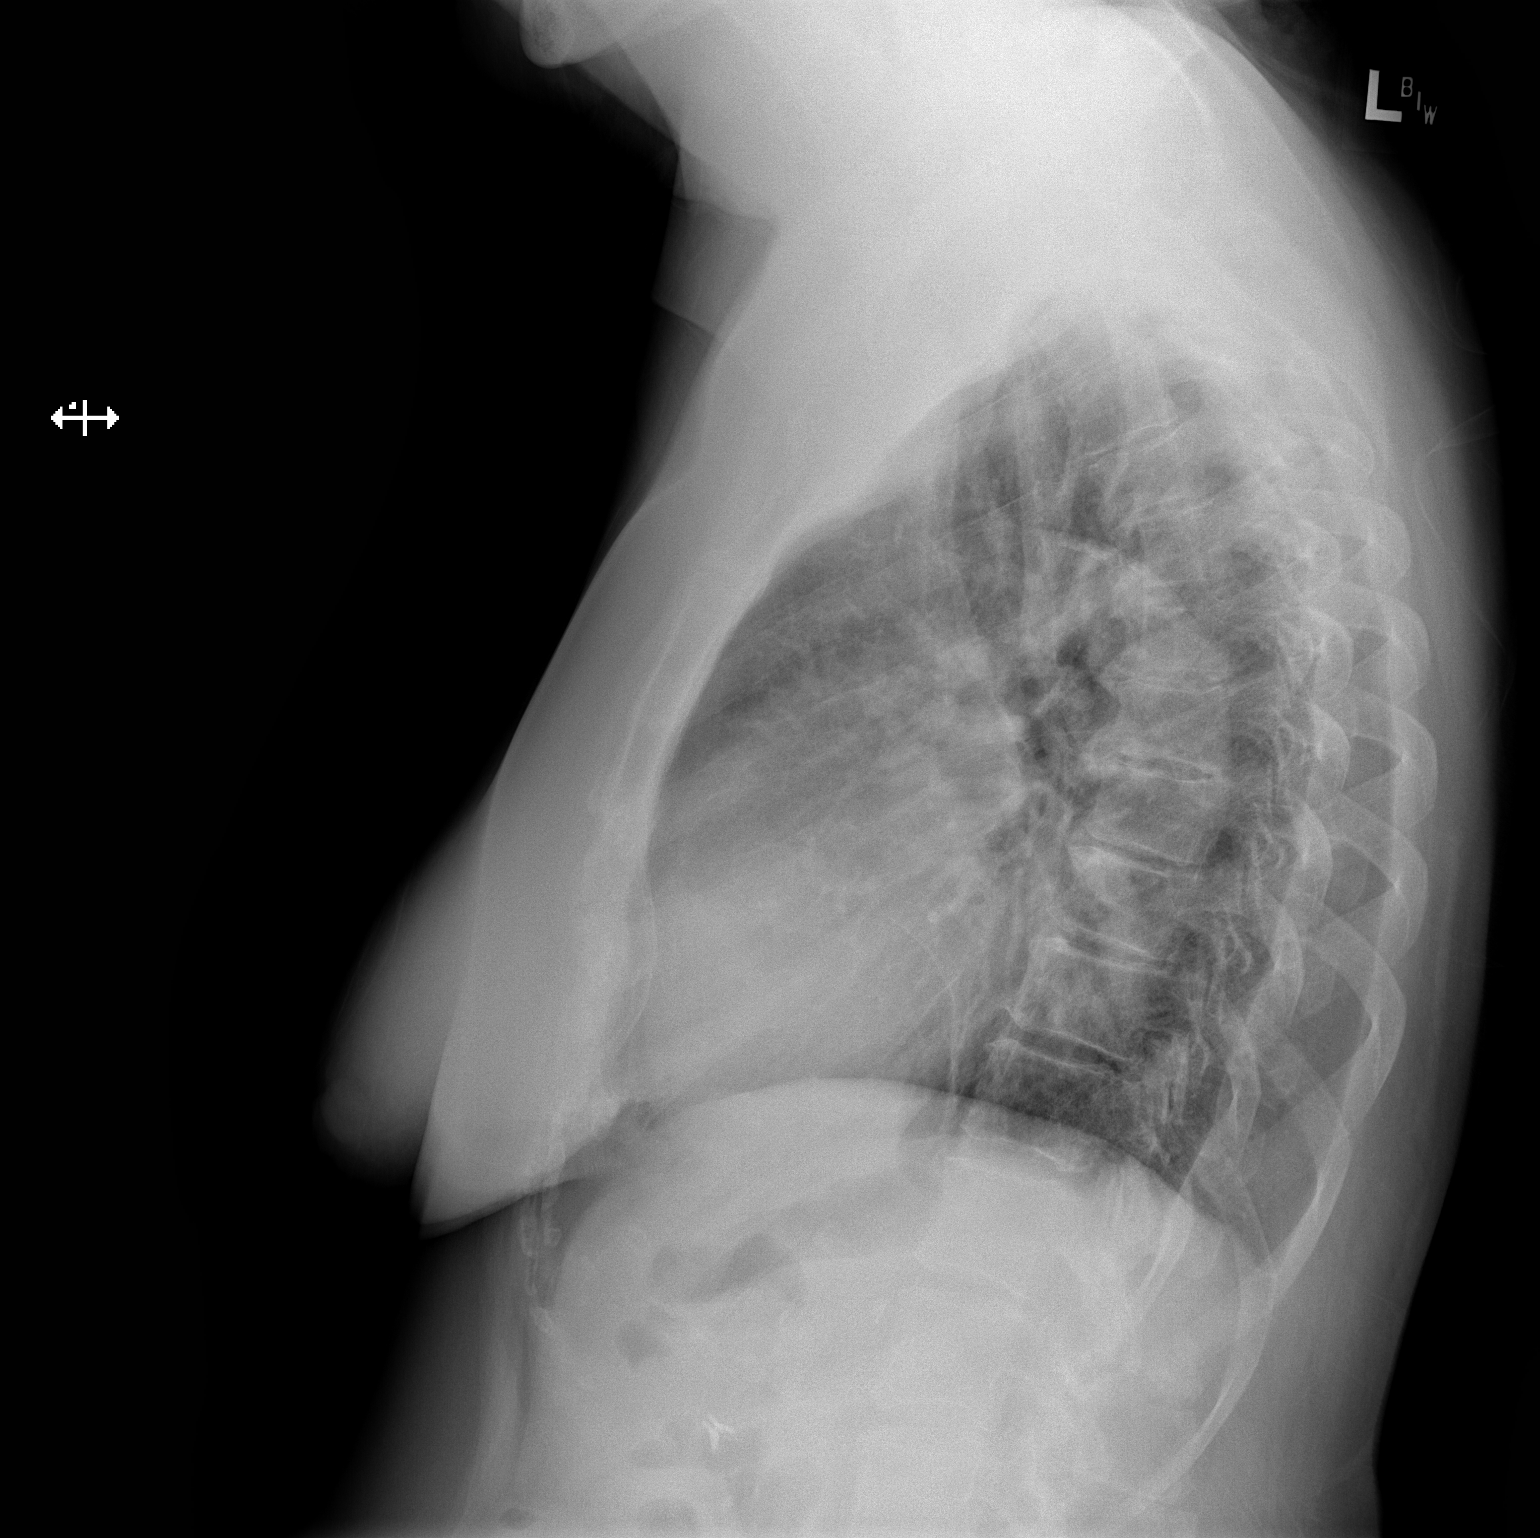

[2 of 2 positions shown; findings below may reference images not displayed]

FINDINGS: Cardiomediastinal silhouette unchanged in size and contour.
Calcification of the aortic arch.

No pneumothorax or pleural effusion. No confluent airspace disease.
Similar appearance of coarsened interstitial markings.

Mild degenerative changes of the spine.  No displaced fracture.
IMPRESSION: Chronic lung changes without evidence of acute cardiopulmonary
disease.

## 2020-12-01 ENCOUNTER — Other Ambulatory Visit: Payer: Self-pay | Admitting: Cardiovascular Disease

## 2021-02-11 ENCOUNTER — Telehealth: Payer: Self-pay | Admitting: Cardiovascular Disease

## 2021-02-11 NOTE — Telephone Encounter (Signed)
*  STAT* If patient is at the pharmacy, call can be transferred to refill team.   1. Which medications need to be refilled? (please list name of each medication and dose if known) rosuvastatin (CRESTOR) 10 MG tablet  2. Which pharmacy/location (including street and city if local pharmacy) is medication to be sent to? [facility] - [place] - [address] [place] [address]  3. Do they need a 30 day or 90 day supply? 90

## 2021-02-11 NOTE — Telephone Encounter (Signed)
Pt's medication was already sent to pt's pharmacy as requested. Called pharmacy and they are getting pt's medication ready for pt to pick up.

## 2021-03-11 ENCOUNTER — Other Ambulatory Visit: Payer: Self-pay | Admitting: Family Medicine

## 2021-03-14 ENCOUNTER — Other Ambulatory Visit: Payer: Medicare Other

## 2021-03-14 ENCOUNTER — Other Ambulatory Visit: Payer: Self-pay | Admitting: Family Medicine

## 2021-03-14 DIAGNOSIS — M25519 Pain in unspecified shoulder: Secondary | ICD-10-CM

## 2021-03-16 ENCOUNTER — Ambulatory Visit
Admission: RE | Admit: 2021-03-16 | Discharge: 2021-03-16 | Disposition: A | Discharge status: Home / Self Care | Payer: Medicare Other | Source: Ambulatory Visit | Attending: Family Medicine | Admitting: Family Medicine

## 2021-03-16 ENCOUNTER — Other Ambulatory Visit: Payer: Self-pay

## 2021-03-16 DIAGNOSIS — M25519 Pain in unspecified shoulder: Secondary | ICD-10-CM

## 2021-03-17 ENCOUNTER — Other Ambulatory Visit: Payer: Self-pay

## 2021-03-17 ENCOUNTER — Telehealth: Payer: Self-pay | Admitting: Physician Assistant

## 2021-03-17 MED ORDER — ROSUVASTATIN CALCIUM 10 MG PO TABS: 10.0000 mg | ORAL_TABLET | ORAL | 0 refills | Status: DC

## 2021-03-17 NOTE — Telephone Encounter (Signed)
*  STAT* If patient is at the pharmacy, call can be transferred to refill team.   1. Which medications need to be refilled? (please list name of each medication and dose if known)   rosuvastatin (CRESTOR) 10 MG tablet     2. Which pharmacy/location (including street and city if local pharmacy) is medication to be sent to? [facility] - [place], [place] - [address]  3. Do they need a 30 day or 90 day supply? 90 day supply   Pt is all out of this medication

## 2021-03-19 ENCOUNTER — Other Ambulatory Visit: Payer: Self-pay | Admitting: Cardiovascular Disease

## 2021-04-18 NOTE — Progress Notes (Addendum)
Cardiology Office Note:    Date:  04/19/2021   ID:  [name], DOB 1931/09/06, MRN [id number]  PCP:  Kaleen Mask, MD   [facility] Providers Cardiologist:  Tonny Bollman, MD Cardiology APP:  Kennon Rounds      Referring MD: Kaleen Mask, *   Chief Complaint:  Follow-up (A. fib, CAD, history of TAVR)    Patient Profile:    [name] is a 87 y.o. female with:  Aortic stenosis S/p TAVR 03/2019 Paroxysmal AFib  CHADS2-VASc=5 (HTN, diab, female, age x 2) >> Apixaban Coronary artery disease (mild to mod nonobs by cath in 03/2019) Hypertension Hyperlipidemia Diabetes mellitus Angioedema 2/2 ACE inhibitor (Lisinopril)   Prior CV studies: Echocardiogram 04/21/20 EF 65-70, no RWMA, GRII DD, normal RVSF, moderately elevated PASP (RVSP 49.5), moderate LAE, mild MR, s/p TAVR with mild PVL and mean gradient 13 mmHg  Echocardiogram 05/14/2019 EF 60-65, pseudo-normalization (Gr 2 DD), no RWMA, RVSP 52.9, mod LAE, circumferential eff, mild to mod MR, TAVR ok   Echocardiogram 04/23/2019 EF > 65, pseudonormalization (Gr 2 DD), no RWMA, sever LAE, trivail eff, trivial MR, s/p TAVR with trivial paravalvular regurgitation, mean gradient 22 mmHg, PASP 32   Cardiac catheterization 03/24/2019 OM2 30 RCA 50    History of Present Illness: [name] was last seen by Cline Crock, PA-C in 6/21.  She returns for f/u.  She is here alone.  She notes that several weeks ago, she started having left arm pain.  This was fairly constant.  She saw primary Hebert and had an MRI done.  This demonstrated multilevel degenerative changes, canal stenosis greatest at C4-C5 and foraminal narrowing greatest on the left from C4-C5 through C7-T1.  It was felt that her arm pain was likely related to her cervical disc disease.  She was sent to a neurosurgeon and placed on Tylenol about 3 weeks ago.  Since then, she has not had any further pain.  She never had chest  discomfort, shortness of breath, syncope.  She did not have any associated jaw pain, nausea or diaphoresis.  Her arm pain was not related to exertion.  It seems to be more related to positional changes.  She has not had orthopnea, leg edema.    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    amLODipine (NORVASC) 10 MG tablet TAKE 1 TABLET BY MOUTH DAILY   amoxicillin (AMOXIL) 500 MG capsule Take four capsules one hour before dental appointment.   apixaban (ELIQUIS) 2.5 MG TABS tablet Take 1 tablet (2.5 mg total) by mouth 2 (two) times daily.   Calcium Carb-Cholecalciferol (CALCIUM 600 + D PO) Take 1 tablet by mouth daily.   latanoprost (XALATAN) 0.005 % ophthalmic solution SMARTSIG:In Eye(s)   levothyroxine (SYNTHROID, LEVOTHROID) 75 MCG tablet Take 75 mcg by mouth daily.   losartan (COZAAR) 50 MG tablet TAKE 1 TABLET BY MOUTH DAILY   metFORMIN (GLUCOPHAGE) 1000  MG tablet Patient takes 1 1/2 tablet by mouth two times daily   metoprolol tartrate (LOPRESSOR) 25 MG tablet TAKE 1 TABLET BY MOUTH TWICE DAILY   omeprazole (PRILOSEC) 40 MG capsule Take 40 mg by mouth as needed.   rosuvastatin (CRESTOR) 10 MG tablet Take 1 tablet (10 mg total) by mouth every other day.   spironolactone (ALDACTONE) 25 MG tablet TAKE 1/2 TABLET BY MOUTH DAILY     Allergies:   Lisinopril   Social History   Tobacco Use   Smoking status: Never   Smokeless tobacco: Never  Vaping Use   Vaping Use: Never used  Substance  Use Topics   Alcohol use: No   Drug use: No     Family Hx: The patient's family history includes Diabetes in her sister, sister, and sister; Heart attack in her father and sister; Hypertension in her sister; Kidney failure in her sister.  Review of Systems  Gastrointestinal:  Negative for hematochezia.  Genitourinary:  Negative for hematuria.    EKGs/Labs/Other Test Reviewed:    EKG:  EKG is  ordered today.  The ekg ordered today demonstrates NSR, HR 62, left bundle branch block, QTC 464  Recent Labs: 04/21/2020: Hemoglobin 13.4; Platelets 198 07/23/2020: BUN 23; Creatinine, Ser 1.66; Potassium 4.7; Sodium 139   Recent Lipid Panel Lab Results  Component Value Date/Time   CHOL 141 08/25/2015 09:35 AM   TRIG 113 08/25/2015 09:35 AM   HDL 52 08/25/2015 09:35 AM   LDLCALC 66 08/25/2015 09:35 AM      Risk Assessment/Calculations:    CHA2DS2-VASc Score = 6  This indicates a 9.7% annual risk of stroke. The patient's score is based upon: CHF History: No HTN History: Yes Diabetes History: Yes Stroke History: No Vascular Disease History: Yes Age Score: 2 Gender Score: 1    Physical Exam:    VS:  BP (!) 148/70   Pulse 62   Ht 5\' 4"  (1.626 m)   Wt 199 lb (90.3 kg)   LMP  (LMP Unknown)   SpO2 97%   BMI 34.16 kg/m     Wt Readings from Last 3 Encounters:  04/19/21 199 lb (90.3 kg)  04/21/20 207 lb (93.9 kg)  09/02/19 206 lb (93.4 kg)     Constitutional:      Appearance: Healthy appearance. Not in distress.  Neck:     Vascular: No JVR. JVD normal.  Pulmonary:     Effort: Pulmonary effort is normal.     Breath sounds: No wheezing. No rales.  Cardiovascular:     Normal rate. Regular rhythm. Normal S1. Normal S2.      Murmurs: There is a grade 2/6 systolic murmur at the ULSB.  Edema:    Ankle: bilateral trace edema of the ankle. Abdominal:     Palpations: Abdomen is soft. There is no hepatomegaly.  Skin:    General: Skin is warm and dry.  Neurological:      Mental Status: Alert and oriented to person, place and time.     Cranial Nerves: Cranial nerves are intact.         ASSESSMENT & PLAN:    1. Aortic valve stenosis 2. S/P TAVR (transcatheter aortic valve replacement) Echocardiogram in 6/21 demonstrated normally functioning aortic valve prosthesis with mean gradient 13 and mild perivalvular leak.  Continue SBE prophylaxis.  Obtain follow-up echocardiogram as noted below.  3. Coronary artery disease without angina pectoris 4.  Left bundle branch block She had mild to moderate nonobstructive disease  by cardiac catheterization in June 2020.  Her electrocardiogram today demonstrates new left bundle branch block.  She had fairly constant left arm pain for several weeks.  She did have cervical disc disease on MRI and her symptoms resolved after starting on Tylenol several weeks ago.  She never had chest pain with this or shortness of breath.  She currently feels at her baseline.  Given the new left bundle branch block, I need to make sure that her arm pain was not an ischemic event.  I will obtain an echocardiogram to rule out LV dysfunction.  I will also obtain a Myoview to rule out ischemia/infarction.  I will have her follow-up with Dr. Excell Seltzer or me in 4-6 weeks.  Obtain most recent fasting lipids from primary Hebert.  Continue rosuvastatin.  She is not on aspirin as she is on Apixaban.  5. Essential hypertension Borderline control.  I will try to provide her with a blood pressure cuff today.  Continue current medications.  I have asked her to monitor blood pressure for the next couple weeks and notify us if her pressure is running too high.  6. Paroxysmal atrial fibrillation (HCC) Maintaining sinus rhythm.  Creatinine has been >1.5 in the past and she is over 70 years old.  She has been appropriate on 2.5 mg twice daily of apixaban.  Obtain most recent labs from primary Hebert (BMET, CBC).   Shared Decision Making/Informed Consent The risks [chest  pain, shortness of breath, cardiac arrhythmias, dizziness, blood pressure fluctuations, myocardial infarction, stroke/transient ischemic attack, nausea, vomiting, allergic reaction, radiation exposure, metallic taste sensation and life-threatening complications (estimated to be 1 in 10,000)], benefits (risk stratification, diagnosing coronary artery disease, treatment guidance) and alternatives of a nuclear stress test were discussed in detail with [name] and she agrees to proceed.    Dispo:  Return in about 4 weeks (around 05/17/2021) for Follow up after testing, w/ Dr. Excell Seltzer, or Tereso Newcomer, PA-C.   Medication Adjustments/Labs and Tests Ordered: Current medicines are reviewed at length with the patient today.  Concerns regarding medicines are outlined above.  Tests Ordered: Orders Placed This Encounter  Procedures   Cardiac Stress Test: Informed Consent Details: Physician/Practitioner Attestation; Transcribe to consent form and obtain patient signature   MYOCARDIAL PERFUSION IMAGING   EKG 12-Lead   ECHOCARDIOGRAM COMPLETE   Medication Changes: No orders of the defined types were placed in this encounter.   Signed, Tereso Newcomer, PA-C  04/19/2021 11:33 AM    [facility] [address] [place], [place], [place]  [address] Phone: [phone]; Fax: [phone]

## 2021-04-19 ENCOUNTER — Encounter: Payer: Self-pay | Admitting: Physician Assistant

## 2021-04-19 ENCOUNTER — Other Ambulatory Visit: Payer: Self-pay

## 2021-04-19 ENCOUNTER — Ambulatory Visit (INDEPENDENT_AMBULATORY_CARE_PROVIDER_SITE_OTHER): Payer: Medicare Other | Admitting: Physician Assistant

## 2021-04-19 VITALS — BP 148/70 | HR 62 | Ht 64.0 in | Wt 199.0 lb

## 2021-04-19 DIAGNOSIS — I350 Nonrheumatic aortic (valve) stenosis: Secondary | ICD-10-CM

## 2021-04-19 DIAGNOSIS — Z952 Presence of prosthetic heart valve: Secondary | ICD-10-CM

## 2021-04-19 DIAGNOSIS — I2510 Atherosclerotic heart disease of native coronary artery without angina pectoris: Secondary | ICD-10-CM | POA: Diagnosis not present

## 2021-04-19 DIAGNOSIS — I10 Essential (primary) hypertension: Secondary | ICD-10-CM

## 2021-04-19 DIAGNOSIS — I447 Left bundle-branch block, unspecified: Secondary | ICD-10-CM | POA: Diagnosis not present

## 2021-04-19 DIAGNOSIS — I480 Paroxysmal atrial fibrillation: Secondary | ICD-10-CM

## 2021-04-19 NOTE — Patient Instructions (Signed)
Medication Instructions:   Your physician recommends that you continue on your current medications as directed. Please refer to the Current Medication list given to you today.  *If you need a refill on your cardiac medications before your next appointment, please call your pharmacy*   Lab Work: None ordered   If you have labs (blood work) drawn today and your tests are completely normal, you will receive your results only by: MyChart Message (if you have MyChart) OR A paper copy in the mail If you have any lab test that is abnormal or we need to change your treatment, we will call you to review the results.   Testing/Procedures: Your physician has requested that you have an echocardiogram. Echocardiography is a painless test that uses sound waves to create images of your heart. It provides your doctor with information about the size and shape of your heart and how well your heart's chambers and valves are working. This procedure takes approximately one hour. There are no restrictions for this procedure.   Your physician has requested that you have a lexiscan myoview. For further information please visit [email or web address] Please follow instruction sheet, as given.   Follow-Up: At [facility], you and your health needs are our priority.  As part of our continuing mission to provide you with exceptional heart care, we have created designated Provider Care Teams.  These Care Teams include your primary Cardiologist (physician) and Advanced Practice Providers (APPs -  Physician Assistants and Nurse Practitioners) who all work together to provide you with the care you need, when you need it.  We recommend signing up for the patient portal called "MyChart".  Sign up information is provided on this After Visit Summary.  MyChart is used to connect with patients for Virtual Visits (Telemedicine).  Patients are able to view lab/test results, encounter notes, upcoming appointments, etc.  Non-urgent  messages can be sent to your provider as well.   To learn more about what you can do with MyChart, go to [email or web address].    Your next appointment:   4 -6week(s)  The format for your next appointment:   In Person  Provider:   You may see Tonny Bollman, MD or one of the following Advanced Practice Providers on your designated Care Team:   Tereso Newcomer, PA-C Chelsea Aus, [place]   Other Instructions Keep check on your blood pressure. If your readings are above 140/80 3 consistent times in a row, please call our office.

## 2021-04-27 ENCOUNTER — Telehealth: Payer: Self-pay | Admitting: Physician Assistant

## 2021-04-27 NOTE — Telephone Encounter (Signed)
Received labs from primary care dated 02/24/2021.  Creatinine 1.07, K+ on 4.1.  Patient has been on apixaban 2.5 mg twice daily given creatinine over 1.5 in the past.  Since her recent creatinine was <1.5, we need to recheck this to see if her apixaban dose needs to be adjusted.  PLAN: Obtain BMET Tereso Newcomer, [place] 04/27/2021 5:38 PM

## 2021-04-28 ENCOUNTER — Other Ambulatory Visit: Payer: Self-pay | Admitting: *Deleted

## 2021-04-28 DIAGNOSIS — Z5181 Encounter for therapeutic drug level monitoring: Secondary | ICD-10-CM

## 2021-05-02 ENCOUNTER — Other Ambulatory Visit: Payer: Self-pay | Admitting: Physician Assistant

## 2021-05-03 ENCOUNTER — Telehealth (HOSPITAL_COMMUNITY): Payer: Self-pay | Admitting: *Deleted

## 2021-05-03 NOTE — Telephone Encounter (Signed)
Patient given detailed instructions per Myocardial Perfusion Study Information Sheet for the test on 05/09/21 at 1030. Patient notified to arrive 15 minutes early and that it is imperative to arrive on time for appointment to keep from having the test rescheduled.  If you need to cancel or reschedule your appointment, please call the office within 24 hours of your appointment. . Patient verbalized understanding.Gerardo Territo, Adelene Idler No mychart

## 2021-05-09 ENCOUNTER — Ambulatory Visit (HOSPITAL_COMMUNITY): Payer: Medicare Other | Attending: Internal Medicine

## 2021-05-09 ENCOUNTER — Encounter: Payer: Self-pay | Admitting: Physician Assistant

## 2021-05-09 ENCOUNTER — Ambulatory Visit (HOSPITAL_BASED_OUTPATIENT_CLINIC_OR_DEPARTMENT_OTHER): Payer: Medicare Other

## 2021-05-09 ENCOUNTER — Other Ambulatory Visit: Payer: Self-pay

## 2021-05-09 ENCOUNTER — Other Ambulatory Visit: Payer: Medicare Other | Admitting: *Deleted

## 2021-05-09 DIAGNOSIS — I447 Left bundle-branch block, unspecified: Secondary | ICD-10-CM | POA: Insufficient documentation

## 2021-05-09 DIAGNOSIS — Z5181 Encounter for therapeutic drug level monitoring: Secondary | ICD-10-CM

## 2021-05-09 DIAGNOSIS — I2510 Atherosclerotic heart disease of native coronary artery without angina pectoris: Secondary | ICD-10-CM | POA: Insufficient documentation

## 2021-05-09 DIAGNOSIS — Z952 Presence of prosthetic heart valve: Secondary | ICD-10-CM | POA: Diagnosis not present

## 2021-05-09 LAB — BASIC METABOLIC PANEL
BUN/Creatinine Ratio: 15 (ref 12–28)
BUN: 19 mg/dL (ref 8–27)
CO2: 22 mmol/L (ref 20–29)
Calcium: 9.8 mg/dL (ref 8.7–10.3)
Chloride: 99 mmol/L (ref 96–106)
Creatinine, Ser: 1.25 mg/dL — ABNORMAL HIGH (ref 0.57–1.00)
Glucose: 147 mg/dL — ABNORMAL HIGH (ref 65–99)
Potassium: 4.2 mmol/L (ref 3.5–5.2)
Sodium: 140 mmol/L (ref 134–144)
eGFR: 42 mL/min/{1.73_m2} — ABNORMAL LOW (ref >59)

## 2021-05-09 LAB — MYOCARDIAL PERFUSION IMAGING
LV dias vol: 92 mL (ref 46–106)
LV sys vol: 31 mL
Peak HR: 62 {beats}/min
Rest HR: 48 {beats}/min
SDS: 2
SRS: 0
SSS: 2
TID: 0.9

## 2021-05-09 LAB — ECHOCARDIOGRAM COMPLETE
AR max vel: 0.79 cm2
AV Area VTI: 0.78 cm2
AV Area mean vel: 0.85 cm2
AV Mean grad: 12.7 mmHg
AV Peak grad: 23.9 mmHg
Ao pk vel: 2.44 m/s
Area-P 1/2: 4.06 cm2
S' Lateral: 3.2 cm

## 2021-05-09 MED ORDER — TECHNETIUM TC 99M TETROFOSMIN IV KIT
10.6000 | PACK | Freq: Once | INTRAVENOUS | Status: AC | PRN
Start: 2021-05-09 — End: 2021-05-09
  Administered 2021-05-09: 10.6 via INTRAVENOUS
  Filled 2021-05-09: qty 11

## 2021-05-09 MED ORDER — TECHNETIUM TC 99M TETROFOSMIN IV KIT
30.9000 | PACK | Freq: Once | INTRAVENOUS | Status: AC | PRN
  Administered 2021-05-09: 30.9 via INTRAVENOUS
  Filled 2021-05-09: qty 31

## 2021-05-09 MED ORDER — REGADENOSON 0.4 MG/5ML IV SOLN
0.4000 mg | Freq: Once | INTRAVENOUS | Status: AC
  Administered 2021-05-09: 0.4 mg via INTRAVENOUS

## 2021-05-12 NOTE — Telephone Encounter (Signed)
Lab Results  Component Value Date   CREATININE 1.25 (H) 05/09/2021    See result note from 05/09/21. Apixaban dose increased to 5 mg twice daily. Tereso Newcomer, PA-C    05/12/2021 8:12 AM

## 2021-05-16 ENCOUNTER — Other Ambulatory Visit: Payer: Self-pay | Admitting: Cardiovascular Disease

## 2021-05-24 ENCOUNTER — Other Ambulatory Visit: Payer: Self-pay | Admitting: *Deleted

## 2021-05-24 DIAGNOSIS — Z5181 Encounter for therapeutic drug level monitoring: Secondary | ICD-10-CM

## 2021-05-24 MED ORDER — APIXABAN 2.5 MG PO TABS: 5.0000 mg | ORAL_TABLET | Freq: Two times a day (BID) | ORAL | 3 refills | Status: DC

## 2021-06-09 ENCOUNTER — Other Ambulatory Visit: Payer: Self-pay | Admitting: Cardiovascular Disease

## 2021-06-30 ENCOUNTER — Other Ambulatory Visit: Payer: Self-pay | Admitting: Cardiovascular Disease

## 2021-07-04 ENCOUNTER — Other Ambulatory Visit: Payer: Self-pay | Admitting: Cardiovascular Disease

## 2021-07-04 NOTE — Telephone Encounter (Signed)
Prescription refill request for Eliquis received. Indication:Afib  Last office visit:04/19/21 Alben Spittle)  Scr: 1.25 (7/18/222)  Age: 87 Weight: 90.3kg  Appropriate dose and refill sent to requested pharmacy.

## 2021-07-06 ENCOUNTER — Other Ambulatory Visit: Payer: Self-pay

## 2021-07-06 ENCOUNTER — Other Ambulatory Visit: Payer: Medicare Other | Admitting: *Deleted

## 2021-07-06 DIAGNOSIS — Z5181 Encounter for therapeutic drug level monitoring: Secondary | ICD-10-CM

## 2021-07-06 LAB — BASIC METABOLIC PANEL
BUN/Creatinine Ratio: 16 (ref 12–28)
BUN: 19 mg/dL (ref 8–27)
CO2: 23 mmol/L (ref 20–29)
Calcium: 10.1 mg/dL (ref 8.7–10.3)
Chloride: 101 mmol/L (ref 96–106)
Creatinine, Ser: 1.22 mg/dL — ABNORMAL HIGH (ref 0.57–1.00)
Glucose: 137 mg/dL — ABNORMAL HIGH (ref 65–99)
Potassium: 3.8 mmol/L (ref 3.5–5.2)
Sodium: 141 mmol/L (ref 134–144)
eGFR: 43 mL/min/{1.73_m2} — ABNORMAL LOW (ref >59)

## 2021-07-06 LAB — CBC
Hematocrit: 41.1 % (ref 34.0–46.6)
Hemoglobin: 13.7 g/dL (ref 11.1–15.9)
MCH: 29.4 pg (ref 26.6–33.0)
MCHC: 33.3 g/dL (ref 31.5–35.7)
MCV: 88 fL (ref 79–97)
Platelets: 205 10*3/uL (ref 150–450)
RBC: 4.66 x10E6/uL (ref 3.77–5.28)
RDW: 12.2 % (ref 11.7–15.4)
WBC: 5.4 10*3/uL (ref 3.4–10.8)

## 2021-07-11 ENCOUNTER — Other Ambulatory Visit: Payer: Self-pay | Admitting: *Deleted

## 2021-08-02 NOTE — Progress Notes (Deleted)
Cardiology Office Note:    Date:  08/02/2021   ID:  [name], DOB Mar 11, 1931, MRN [id number]  PCP:  Kaleen Mask, MD   [facility] Providers Cardiologist:  Tonny Bollman, MD Cardiology APP:  Beatrice Lecher, PA-C { Click to update primary MD,subspecialty MD or APP then REFRESH:1}  *** Referring MD: Kaleen Mask, *   Chief Complaint:  No chief complaint on file. {Click here for Visit Info    :1}   Patient Profile:   [name] is a 87 y.o. female with:  Aortic stenosis S/p TAVR 03/2019 Paroxysmal AFib  CHADS2-VASc=5 (HTN, diab, female, age x 2) >> Apixaban Coronary artery disease (mild to mod nonobs by cath in 03/2019) Hypertension Hyperlipidemia Diabetes mellitus Angioedema 2/2 ACE inhibitor (Lisinopril)    Prior CV studies: GATED SPECT MYO PERF W/LEXISCAN STRESS 1D 05/09/2021 Narrative  The left ventricular ejection fraction is hyperdynamic (>65%).  Nuclear stress EF: 67%.  There was no ST segment deviation noted during stress.  The study is normal.  This is a low risk study.   ECHO COMPLETE WO IMAGING ENHANCING AGENT 05/09/2021 1. Left ventricular ejection fraction, by estimation, is 55 to 60%. The left ventricle has normal function. The left ventricle has no regional wall motion abnormalities. There is mild concentric left ventricular hypertrophy. Left ventricular diastolic parameters are consistent with Grade II diastolic dysfunction (pseudonormalization). 2. Right ventricular systolic function is normal. The right ventricular size is normal. There is moderately elevated pulmonary artery systolic pressure. The estimated right ventricular systolic pressure is 45.5 mmHg. 3. Left atrial size was moderately dilated. 4. A small pericardial effusion is present. The pericardial effusion is circumferential. 5. The mitral valve is normal in structure. Mild mitral valve regurgitation. 6. The aortic valve has been repaired/replaced by  a 23 mm Sapien Valve. Aortic valve regurgitation is mild, paravalular at the 3 o'clock position. Aortic valve mean gradient measures 12.7 mmHg. Aortic valve Vmax measures 2.44 m/s. Aortic valve acceleration time measures 90 msec. 7. The inferior vena cava is normal in size with greater than 50% respiratory variability, suggesting right atrial pressure of 3 mmHg. estimated right ventricular systolic pressure is 45.5 mmHg. Aortic Valve: The aortic valve has been repaired/replaced. Aortic valve regurgitation is mild. Aortic valve mean gradient measures 12.7 mmHg. Aortic valve peak gradient measures 23.9 mmHg. Aortic valve area, by VTI measures 0.78 cm.  Echocardiogram 04/21/20 EF 65-70, no RWMA, GRII DD, normal RVSF, moderately elevated PASP (RVSP 49.5), moderate LAE, mild MR, s/p TAVR with mild PVL and mean gradient 13 mmHg   Echocardiogram 05/14/2019 EF 60-65, pseudo-normalization (Gr 2 DD), no RWMA, RVSP 52.9, mod LAE, circumferential eff, mild to mod MR, TAVR ok   Echocardiogram 04/23/2019 EF > 65, pseudonormalization (Gr 2 DD), no RWMA, sever LAE, trivail eff, trivial MR, s/p TAVR with trivial paravalvular regurgitation, mean gradient 22 mmHg, PASP 32   Cardiac catheterization 03/24/2019 OM2 30 RCA 50   History of Present Illness: Ms. Salmon was last seen in June.  She had a new Left Bundle Branch Block on EKG and was set up for a Myoview and echocardiogram.  The Myoview was low risk and the echocardiogram showed normal EF, stable TAVR gradients with mild PVL and mod pulmonary hypertension.   She returns for f/u.  ***    Past Medical History:  Diagnosis Date   Exogenous obesity    Hyperlipidemia    Hypertension    Hypothyroidism    LBBB (left bundle branch  block)    Echo 7/22: EF 55-60, no RWMA, mild LVH, GRII DD, normal RVSF, RVSP 45.5 (moderately elevated pulmonary artery systolic pressure), moderate LAE, small pericardial effusion (circumferential), mild MR, s/p TAVR with mild  paravalvular leak and mean gradient 12.7 mmHg // Myoview 7/22: EF 67, normal perfusion, low risk   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   23 mm Edwards Sapien 3 transcatheter heart valve placed via percutaneous right transfemoral approach    Severe aortic stenosis    Type II diabetes mellitus (HCC)    Wears glasses    Current Medications: No outpatient medications have been marked as taking for the 08/03/21 encounter (Appointment) with Tereso Newcomer T, PA-C.    Allergies:   Lisinopril   Social History   Tobacco Use   Smoking status: Never   Smokeless tobacco: Never  Vaping Use   Vaping Use: Never used  Substance Use Topics   Alcohol use: No   Drug use: No    Family Hx: The patient's family history includes Diabetes in her sister, sister, and sister; Heart attack in her father and sister; Hypertension in her sister; Kidney failure in her sister.  ROS   EKGs/Labs/Other Test Reviewed:    EKG:  EKG is *** ordered today.  The ekg ordered today demonstrates ***  Recent Labs: 07/06/2021: BUN 19; Creatinine, Ser 1.22; Hemoglobin 13.7; Platelets 205; Potassium 3.8; Sodium 141   Recent Lipid Panel Lab Results  Component Value Date/Time   CHOL 141 08/25/2015 09:35 AM   TRIG 113 08/25/2015 09:35 AM   HDL 52 08/25/2015 09:35 AM   LDLCALC 66 08/25/2015 09:35 AM     Risk Assessment/Calculations:   {Does this patient have ATRIAL FIBRILLATION?:[id number]}      Physical Exam:    VS:  LMP  (LMP Unknown)     Wt Readings from Last 3 Encounters:  05/09/21 199 lb (90.3 kg)  04/19/21 199 lb (90.3 kg)  04/21/20 207 lb (93.9 kg)    Physical Exam ***     ASSESSMENT & PLAN:   {Select for Dx:25819} 1. Aortic valve stenosis 2. S/P TAVR (transcatheter aortic valve replacement) Echocardiogram in 6/21 demonstrated normally functioning aortic valve prosthesis with mean gradient 13 and mild perivalvular leak.  Continue SBE prophylaxis.  Obtain follow-up  echocardiogram as noted below.   3. Coronary artery disease without angina pectoris 4.  Left bundle branch block She had mild to moderate nonobstructive disease by cardiac catheterization in June 2020.  Her electrocardiogram today demonstrates new left bundle branch block.  She had fairly constant left arm pain for several weeks.  She did have cervical disc disease on MRI and her symptoms resolved after starting on Tylenol several weeks ago.  She never had chest pain with this or shortness of breath.  She currently feels at her baseline.  Given the new left bundle branch block, I need to make sure that her arm pain was not an ischemic event.  I will obtain an echocardiogram to rule out LV dysfunction.  I will also obtain a Myoview to rule out ischemia/infarction.  I will have her follow-up with Dr. Excell Seltzer or me in 4-6 weeks.  Obtain most recent fasting lipids from primary care.  Continue rosuvastatin.  She is not on aspirin as she is on Apixaban.   5. Essential hypertension Borderline control.  I will try to provide her with a blood pressure cuff today.  Continue current medications.  I have asked her to monitor blood pressure for  the next couple weeks and notify us if her pressure is running too high.   6. Paroxysmal atrial fibrillation (HCC) Maintaining sinus rhythm.  Creatinine has been >1.5 in the past and she is over 34 years old.  She has been appropriate on 2.5 mg twice daily of apixaban.  Obtain most recent labs from primary care (BMET, CBC).    {Are you ordering a CV Procedure (e.g. stress test, cath, DCCV, TEE, etc)?   Press F2        :[id number]}   Dispo:  No follow-ups on file.   Medication Adjustments/Labs and Tests Ordered: Current medicines are reviewed at length with the patient today.  Concerns regarding medicines are outlined above.  Tests Ordered: No orders of the defined types were placed in this encounter.  Medication Changes: No orders of the defined types were placed in  this encounter.  Signed, Tereso Newcomer, PA-C  08/02/2021 10:28 PM    [facility] [address] [place], [place], [place]  [address] Phone: [phone]; Fax: [phone]

## 2021-08-03 ENCOUNTER — Ambulatory Visit: Payer: Medicare Other | Admitting: Physician Assistant

## 2021-08-03 DIAGNOSIS — I480 Paroxysmal atrial fibrillation: Secondary | ICD-10-CM

## 2021-08-03 DIAGNOSIS — I350 Nonrheumatic aortic (valve) stenosis: Secondary | ICD-10-CM

## 2021-08-03 DIAGNOSIS — I2510 Atherosclerotic heart disease of native coronary artery without angina pectoris: Secondary | ICD-10-CM

## 2021-08-03 DIAGNOSIS — I10 Essential (primary) hypertension: Secondary | ICD-10-CM

## 2021-08-03 DIAGNOSIS — E7800 Pure hypercholesterolemia, unspecified: Secondary | ICD-10-CM

## 2021-08-03 DIAGNOSIS — Z952 Presence of prosthetic heart valve: Secondary | ICD-10-CM

## 2021-08-03 DIAGNOSIS — I447 Left bundle-branch block, unspecified: Secondary | ICD-10-CM

## 2021-08-08 ENCOUNTER — Other Ambulatory Visit: Payer: Self-pay | Admitting: Physician Assistant

## 2021-09-19 ENCOUNTER — Other Ambulatory Visit: Payer: Self-pay | Admitting: Physician Assistant

## 2021-09-21 ENCOUNTER — Other Ambulatory Visit: Payer: Self-pay | Admitting: Physician Assistant

## 2021-12-06 LAB — HM DIABETES EYE EXAM

## 2022-02-21 ENCOUNTER — Other Ambulatory Visit: Payer: Self-pay | Admitting: Cardiovascular Disease

## 2022-02-21 NOTE — Telephone Encounter (Signed)
Pt last saw Tereso Newcomer 04/19/21, pt is overdue for follow-up per last OV note.  Cancelled appt in 10/22 and did not r/s.  Will need at least a yearly follow-up in June to continue refill medications.  Will refill x 1 only and send a message to schedulers to contact pt for appt.  Also placed note on refill must see MD for future refills. Last labs 07/06/21 Creat 1.22, age 88, weight 90.3kg, based on specified criteria pt is on appropriate dosage of Eliquis 5mg  BID.  Will refill rx.  ?

## 2022-02-27 ENCOUNTER — Ambulatory Visit (HOSPITAL_COMMUNITY)
Admission: EM | Admit: 2022-02-27 | Discharge: 2022-02-27 | Disposition: A | Discharge status: Home / Self Care | Payer: Medicare Other | Attending: Internal Medicine | Admitting: Internal Medicine

## 2022-02-27 ENCOUNTER — Encounter (HOSPITAL_COMMUNITY): Payer: Self-pay | Admitting: Emergency Medicine

## 2022-02-27 DIAGNOSIS — R42 Dizziness and giddiness: Secondary | ICD-10-CM | POA: Diagnosis not present

## 2022-02-27 LAB — CBG MONITORING, ED: Glucose-Capillary: 141 mg/dL — ABNORMAL HIGH (ref 70–99)

## 2022-02-27 NOTE — ED Triage Notes (Signed)
Pt is present today with c/o dizziness. Pt states her sx started this morning  ?
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
## 2022-02-27 NOTE — Discharge Instructions (Signed)
You were seen today for dizziness. I have ruled out urgent causes to your dizziness and am reassured by your physical exam, CBG, and EKG today. There are no obvious causes to why you experienced dizziness today, but I suspect this was related to mild dehydration. Continue to drink plenty of water and eat a well balanced diet. ? ?Purchase compression stockings to wear to decrease the swelling in your legs. Please also make an appointment with your primary care provider for evaluation of your diarrhea that is possibly related to your metformin use.  ? ?If you develop any new or worsening symptoms or do not improve in the next 2 to 3 days, please return.  If your symptoms are severe, please go to the emergency room.  Follow-up with your primary care provider for further evaluation and management of your symptoms as well as ongoing wellness visits.  I hope you feel better! ?

## 2022-03-13 ENCOUNTER — Other Ambulatory Visit: Payer: Self-pay | Admitting: Physician Assistant

## 2022-03-16 ENCOUNTER — Encounter: Payer: Self-pay | Admitting: Internal Medicine

## 2022-03-16 ENCOUNTER — Ambulatory Visit (INDEPENDENT_AMBULATORY_CARE_PROVIDER_SITE_OTHER): Payer: Medicare Other | Admitting: Internal Medicine

## 2022-03-16 VITALS — BP 185/103 | HR 81 | Temp 98.6°F | Ht 64.0 in | Wt 202.1 lb

## 2022-03-16 DIAGNOSIS — E7800 Pure hypercholesterolemia, unspecified: Secondary | ICD-10-CM

## 2022-03-16 DIAGNOSIS — Z0000 Encounter for general adult medical examination without abnormal findings: Secondary | ICD-10-CM

## 2022-03-16 DIAGNOSIS — E1122 Type 2 diabetes mellitus with diabetic chronic kidney disease: Secondary | ICD-10-CM

## 2022-03-16 DIAGNOSIS — I129 Hypertensive chronic kidney disease with stage 1 through stage 4 chronic kidney disease, or unspecified chronic kidney disease: Secondary | ICD-10-CM

## 2022-03-16 DIAGNOSIS — N1831 Chronic kidney disease, stage 3a: Secondary | ICD-10-CM

## 2022-03-16 DIAGNOSIS — E039 Hypothyroidism, unspecified: Secondary | ICD-10-CM

## 2022-03-16 DIAGNOSIS — Z7901 Long term (current) use of anticoagulants: Secondary | ICD-10-CM

## 2022-03-16 DIAGNOSIS — I350 Nonrheumatic aortic (valve) stenosis: Secondary | ICD-10-CM

## 2022-03-16 DIAGNOSIS — E0590 Thyrotoxicosis, unspecified without thyrotoxic crisis or storm: Secondary | ICD-10-CM

## 2022-03-16 DIAGNOSIS — I4819 Other persistent atrial fibrillation: Secondary | ICD-10-CM

## 2022-03-16 DIAGNOSIS — E119 Type 2 diabetes mellitus without complications: Secondary | ICD-10-CM

## 2022-03-16 DIAGNOSIS — B351 Tinea unguium: Secondary | ICD-10-CM

## 2022-03-16 DIAGNOSIS — I10 Essential (primary) hypertension: Secondary | ICD-10-CM

## 2022-03-16 LAB — GLUCOSE, CAPILLARY: Glucose-Capillary: 137 mg/dL — ABNORMAL HIGH (ref 70–99)

## 2022-03-16 LAB — POCT GLYCOSYLATED HEMOGLOBIN (HGB A1C)

## 2022-03-16 MED ORDER — LOSARTAN POTASSIUM-HCTZ 100-12.5 MG PO TABS: 1.0000 | ORAL_TABLET | Freq: Every day | ORAL | 2 refills | Status: DC

## 2022-03-16 NOTE — Assessment & Plan Note (Addendum)
Blood pressure today 192/109 and repeat 185/103.  Patient is presenting to establish care since her PCP is nearing retirement age.  She was told to discontinue amlodipine last few months due to concern for the side effect of lower extremity swelling.  Currently she is only taking losartan 50 mg daily and metoprolol 25 mg twice daily.  She manages her own medications at home.  Last BMP in our system from September 2022 which showed evidence of possible CKD and normal electrolytes.  She does have some signs of volume overload on exam including 1+ pitting edema bilateral lower extremities and trace crackles right lower lung base for hypervolemia might also be contributing to increased blood pressure.  Currently blood pressure is poorly controlled on antihypertensive regimen.  Suspect hypervolemia to elevated BPs.  Patient will need to follow-up with cardiology for repeat echo given signs of volume overload on exam.  Currently not in acute decompensated heart failure exacerbation. She has an appointment with cardiology 6/14. Plan: -Start losartan- hydrochlorothiazide 100-12.5 mg daily, stop other losartan prescription -Continue metoprolol 25 mg twice daily -Check BMP today  ADDENDUM: BMP with elevated creatine stable from prior at 1.28 which seems to be round her recent baseline. Suspect CKD 3a secondary to poorly controlled hypertension.

## 2022-03-16 NOTE — Patient Instructions (Signed)
Thank you, [name] for allowing [place] to provide your care today. Today we discussed:  Diabetes: -We will check your hemoglobin A1c today, if it is high we may have you start medication called Jardiance. I will call you with results -I will give information on healthy eating for patients with diabetes -Please obtain the records from your visits with your eye doctor, their office can fax it to [place] [phone] (fax #). -I will refer you to a podiatrist  High Blood pressure: -Your blood pressure was 192/109 today, very high. We will stop your losartan and start a combo medicine: Losartan-HCTZ (hyzaar) -We will also check your kidney function  Swelling: -Please follow up with your cardiologist in July, let him know you are having shortness of breath and leg swelling, he will want to repeat your echocardiogram to look at your heart function. -The HCTZ medicine in the combo pill can help some with swelling.   We will check your thyroid, kidney function and blood sugars today:  I have ordered the following labs for you:   Lab Orders         BMP8+Anion Gap         TSH         POC Hbg A1C      I will call if any are abnormal. All of your labs can be accessed through "My Chart".   My Chart Access: [email or web address]  Please follow-up in one month for blood pressure check.  Please make sure to arrive 15 minutes prior to your next appointment. If you arrive late, you may be asked to reschedule.    We look forward to seeing you next time. Please call our clinic at [phone] if you have any questions or concerns. The best time to call is Monday-Friday from 9am-4pm, but there is someone available 24/7. If after hours or the weekend, call the main hospital number and ask for the Internal Medicine Resident On-Call. If you need medication refills, please notify your pharmacy one week in advance and they will send [place] a request.   Thank you  for letting us take part in your care. Wishing you the best!  Ellison Carwin, MD 03/16/2022, 10:42 AM IM Resident, PGY-1
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
## 2022-03-16 NOTE — Assessment & Plan Note (Signed)
CHA2DS2-VASc 5. Regular rate and rhythm on exam today.  Patient takes metoprolol 25 mg twice daily and Eliquis 5 mg twice daily.  She has not had recent falls.  She did present to the ED for dizziness 5/08 and EKG showed a flutter with left bundle branch block though this is consistent with prior EKGs. Has follow up with Cardiology 6/14.  Plan: -Continue to follow with Cardiology -Continue Metoprolol, Eliquis as above

## 2022-03-17 ENCOUNTER — Encounter: Payer: Self-pay | Admitting: Internal Medicine

## 2022-03-17 DIAGNOSIS — E0590 Thyrotoxicosis, unspecified without thyrotoxic crisis or storm: Secondary | ICD-10-CM | POA: Insufficient documentation

## 2022-03-17 DIAGNOSIS — Z0000 Encounter for general adult medical examination without abnormal findings: Secondary | ICD-10-CM | POA: Insufficient documentation

## 2022-03-17 DIAGNOSIS — N1831 Chronic kidney disease, stage 3a: Secondary | ICD-10-CM | POA: Insufficient documentation

## 2022-03-17 DIAGNOSIS — E039 Hypothyroidism, unspecified: Secondary | ICD-10-CM | POA: Insufficient documentation

## 2022-03-17 LAB — BMP8+ANION GAP
Anion Gap: 16 mmol/L (ref 10.0–18.0)
BUN/Creatinine Ratio: 17 (ref 12–28)
BUN: 22 mg/dL (ref 8–27)
CO2: 21 mmol/L (ref 20–29)
Calcium: 10.1 mg/dL (ref 8.7–10.3)
Chloride: 103 mmol/L (ref 96–106)
Creatinine, Ser: 1.28 mg/dL — ABNORMAL HIGH (ref 0.57–1.00)
Glucose: 147 mg/dL — ABNORMAL HIGH (ref 70–99)
Potassium: 4.3 mmol/L (ref 3.5–5.2)
Sodium: 140 mmol/L (ref 134–144)
eGFR: 40 mL/min/{1.73_m2} — ABNORMAL LOW (ref >59)

## 2022-03-17 LAB — TSH: TSH: 7.19 u[IU]/mL — ABNORMAL HIGH (ref 0.450–4.500)

## 2022-03-17 NOTE — Assessment & Plan Note (Signed)
Last hemoglobin A1c from 2020 7.2%, patient reports her PCP told her most recent hemoglobin A1c was around 6.  She is currently not on medications and is managing her diabetes through lifestyle modifications.  No polydipsia or polyuria.  Sees an eye doctor yearly.  Hemoglobin A1c was performed today however no result was generated.  Will need to follow-up with lab about this.  Plan: -Will need to obtain updated hemoglobin A1c to guide further management -Discussed possibility of starting SGLT2 inhibitor if patient's hemoglobin A1c remains ~7 or above -Foot exam completed today documented -Patient and her daughter encouraged to obtain records from her last visit for our documentation

## 2022-03-17 NOTE — Assessment & Plan Note (Signed)
Patient presents to establish care and reports history of hypothyroidism on Synthroid 75 mg daily.  She reports adherence.  Last TSH from 2017 was 14.56.  Patient denies recent weight gain, cold intolerance, fatigue, reports chronic constipation.  Will obtain TSH today.  Addendum: TSH elevated at 7.190.  May need to increase Synthroid at next appointment.  She has a follow-up appointment with her PCP 6/21.

## 2022-03-17 NOTE — Assessment & Plan Note (Signed)
Last lipid panel in our system from November 2016 with total cholesterol 141, HDL 52, LDL 66, triglycerides 113.  Patient currently taking Crestor 10 mg daily.  Given patient is 40 and tolerating Crestor 10, we will continue without changes.

## 2022-03-17 NOTE — Assessment & Plan Note (Signed)
Pneumovax, shingles, COVID-vaccine and tetanus booster.  Please records of her last eye exam.  Hemoglobin A1c was obtained this visit though did not result for unclear reasons.

## 2022-03-17 NOTE — Assessment & Plan Note (Signed)
She follows with [place]. Last OV 04/2021. Has follow up scheduled 04/05/22. Last echocardiogram 04/2021.  EF 0000000, grade 2 diastolic dysfunction, moderately elevated pulmonary artery pressure, mild perivalvular AR.  Encourage patient to follow-up with cardiology.  She would benefit from repeat echocardiogram given signs of volume overload on exam.

## 2022-03-18 ENCOUNTER — Other Ambulatory Visit: Payer: Self-pay

## 2022-03-18 ENCOUNTER — Emergency Department (HOSPITAL_COMMUNITY): Payer: Medicare Other

## 2022-03-18 ENCOUNTER — Emergency Department (HOSPITAL_COMMUNITY)
Admission: EM | Admit: 2022-03-18 | Discharge: 2022-03-18 | Disposition: A | Discharge status: Home / Self Care | Payer: Medicare Other | Attending: Emergency Medicine | Admitting: Emergency Medicine

## 2022-03-18 ENCOUNTER — Encounter (HOSPITAL_COMMUNITY): Payer: Self-pay

## 2022-03-18 DIAGNOSIS — I509 Heart failure, unspecified: Secondary | ICD-10-CM | POA: Diagnosis not present

## 2022-03-18 DIAGNOSIS — Z79899 Other long term (current) drug therapy: Secondary | ICD-10-CM | POA: Diagnosis not present

## 2022-03-18 DIAGNOSIS — N189 Chronic kidney disease, unspecified: Secondary | ICD-10-CM | POA: Insufficient documentation

## 2022-03-18 DIAGNOSIS — I130 Hypertensive heart and chronic kidney disease with heart failure and stage 1 through stage 4 chronic kidney disease, or unspecified chronic kidney disease: Secondary | ICD-10-CM | POA: Insufficient documentation

## 2022-03-18 DIAGNOSIS — I4891 Unspecified atrial fibrillation: Secondary | ICD-10-CM | POA: Insufficient documentation

## 2022-03-18 DIAGNOSIS — Z7901 Long term (current) use of anticoagulants: Secondary | ICD-10-CM | POA: Insufficient documentation

## 2022-03-18 DIAGNOSIS — I10 Essential (primary) hypertension: Secondary | ICD-10-CM

## 2022-03-18 DIAGNOSIS — R0602 Shortness of breath: Secondary | ICD-10-CM | POA: Diagnosis present

## 2022-03-18 LAB — COMPREHENSIVE METABOLIC PANEL
ALT: 22 U/L (ref 0–44)
AST: 27 U/L (ref 15–41)
Albumin: 3.7 g/dL (ref 3.5–5.0)
Alkaline Phosphatase: 35 U/L — ABNORMAL LOW (ref 38–126)
Anion gap: 11 (ref 5–15)
BUN: 24 mg/dL — ABNORMAL HIGH (ref 8–23)
CO2: 23 mmol/L (ref 22–32)
Calcium: 10 mg/dL (ref 8.9–10.3)
Chloride: 103 mmol/L (ref 98–111)
Creatinine, Ser: 1.48 mg/dL — ABNORMAL HIGH (ref 0.44–1.00)
GFR, Estimated: 34 mL/min — ABNORMAL LOW (ref >60)
Glucose, Bld: 114 mg/dL — ABNORMAL HIGH (ref 70–99)
Potassium: 4.1 mmol/L (ref 3.5–5.1)
Sodium: 137 mmol/L (ref 135–145)
Total Bilirubin: 0.6 mg/dL (ref 0.3–1.2)
Total Protein: 6.9 g/dL (ref 6.5–8.1)

## 2022-03-18 LAB — CBC WITH DIFFERENTIAL/PLATELET
Abs Immature Granulocytes: 0.01 10*3/uL (ref 0.00–0.07)
Basophils Absolute: 0.1 10*3/uL (ref 0.0–0.1)
Basophils Relative: 1 %
Eosinophils Absolute: 0.2 10*3/uL (ref 0.0–0.5)
Eosinophils Relative: 4 %
HCT: 42 % (ref 36.0–46.0)
Hemoglobin: 13.7 g/dL (ref 12.0–15.0)
Immature Granulocytes: 0 %
Lymphocytes Relative: 39 %
Lymphs Abs: 2.2 10*3/uL (ref 0.7–4.0)
MCH: 30.6 pg (ref 26.0–34.0)
MCHC: 32.6 g/dL (ref 30.0–36.0)
MCV: 93.8 fL (ref 80.0–100.0)
Monocytes Absolute: 0.8 10*3/uL (ref 0.1–1.0)
Monocytes Relative: 14 %
Neutro Abs: 2.4 10*3/uL (ref 1.7–7.7)
Neutrophils Relative %: 42 %
Platelets: 201 10*3/uL (ref 150–400)
RBC: 4.48 MIL/uL (ref 3.87–5.11)
RDW: 13.4 % (ref 11.5–15.5)
WBC: 5.6 10*3/uL (ref 4.0–10.5)
nRBC: 0 % (ref 0.0–0.2)

## 2022-03-18 LAB — URINALYSIS, ROUTINE W REFLEX MICROSCOPIC
Bacteria, UA: NONE SEEN
Bilirubin Urine: NEGATIVE
Glucose, UA: NEGATIVE mg/dL
Ketones, ur: NEGATIVE mg/dL
Nitrite: NEGATIVE
Protein, ur: 100 mg/dL — AB
Specific Gravity, Urine: 1.011 (ref 1.005–1.030)
pH: 5 (ref 5.0–8.0)

## 2022-03-18 LAB — PROTIME-INR
INR: 1.4 — ABNORMAL HIGH (ref 0.8–1.2)
Prothrombin Time: 17.1 seconds — ABNORMAL HIGH (ref 11.4–15.2)

## 2022-03-18 LAB — TROPONIN I (HIGH SENSITIVITY)
Troponin I (High Sensitivity): 5 ng/L (ref <18)
Troponin I (High Sensitivity): 5 ng/L (ref <18)

## 2022-03-18 LAB — BRAIN NATRIURETIC PEPTIDE: B Natriuretic Peptide: 485.6 pg/mL — ABNORMAL HIGH (ref 0.0–100.0)

## 2022-03-18 LAB — MAGNESIUM: Magnesium: 1.9 mg/dL (ref 1.7–2.4)

## 2022-03-18 MED ORDER — POTASSIUM CHLORIDE CRYS ER 20 MEQ PO TBCR
20.0000 meq | EXTENDED_RELEASE_TABLET | Freq: Once | ORAL | Status: AC
  Administered 2022-03-18: 20 meq via ORAL
  Filled 2022-03-18: qty 1

## 2022-03-18 MED ORDER — FUROSEMIDE 10 MG/ML IJ SOLN
20.0000 mg | Freq: Once | INTRAMUSCULAR | Status: AC
  Administered 2022-03-18: 20 mg via INTRAVENOUS
  Filled 2022-03-18: qty 2

## 2022-03-18 NOTE — ED Provider Notes (Signed)
[facility] EMERGENCY DEPARTMENT Provider Note   CSN: [id number] Arrival date & time: 03/18/22  1456     History {Add pertinent medical, surgical, social history, OB history to HPI:1} Chief Complaint  Patient presents with   Shortness of Breath   Hypertension    [name] is a 88 y.o. female.  She has a history of A-fib on anticoagulation, hypertension, aortic stenosis, CKD.  She has had high blood pressure for the last 2 weeks.  Cause her to feel short of breath with exertion and dizziness like she might pass out.  She saw a new primary care doctor Thursday and they added a new blood pressure medicine.  Noted to have some swelling on her legs.  She was referred to cardiology and has not seen them yet.  Today her blood pressure was 180s over 110s, still symptomatic with shortness of breath.  Also has some upper back pain when she lays in her chair although not having any now.  This has been going on for 2 days.  She denies any chest pain abdominal pain vomiting diarrhea.  She was a little constipated yesterday.  No urinary symptoms.  No recent falls.  Unclear if sticking to low-salt diet  The history is provided by the patient and a relative.  Shortness of Breath Severity:  Moderate Onset quality:  Gradual Duration:  2 weeks Timing:  Intermittent Progression:  Unchanged Chronicity:  New Relieved by:  Nothing Worsened by:  Activity Ineffective treatments:  Rest Associated symptoms: cough   Associated symptoms: no abdominal pain, no chest pain, no diaphoresis, no fever, no headaches, no hemoptysis, no rash, no sore throat, no sputum production and no vomiting   Risk factors: no recent alcohol use and no tobacco use   Hypertension This is a chronic problem. The problem occurs constantly. The problem has not changed since onset.Associated symptoms include shortness of breath. Pertinent negatives include no chest pain, no abdominal pain and no headaches.  Nothing aggravates the symptoms. Nothing relieves the symptoms. Treatments tried: New medication. The treatment provided no relief.      Home Medications Prior to Admission medications   Medication Sig Start Date End Date Taking? Authorizing Provider  acetaminophen (TYLENOL) 500 MG tablet Take 500 mg by mouth every 6 (six) hours as needed for mild pain, moderate pain or headache.     [provider]  amoxicillin (AMOXIL) 500 MG capsule Take four capsules one hour before dental appointment. 04/22/20   Eileen Stanford, PA-C  apixaban (ELIQUIS) 5 MG TABS tablet Take 1 tablet (5 mg total) by mouth 2 (two) times daily. Overdue for follow-up, MUST see MD for FUTURE refills. 02/21/22   Sherren Mocha, MD  Calcium Carb-Cholecalciferol (CALCIUM 600 + D PO) Take 1 tablet by mouth daily.    [provider]  latanoprost (XALATAN) 0.005 % ophthalmic solution SMARTSIG:In Eye(s) 02/14/21   [provider]  levothyroxine (SYNTHROID, LEVOTHROID) 75 MCG tablet Take 75 mcg by mouth daily. 08/08/16   [provider]  losartan-hydrochlorothiazide (HYZAAR) 100-12.5 MG tablet Take 1 tablet by mouth daily. 03/16/22   Wayland Denis, MD  metoprolol tartrate (LOPRESSOR) 25 MG tablet Take 1 tablet (25 mg total) by mouth 2 (two) times daily. Please keep upcoming appointment for future refills. Thank you. 03/13/22   Richardson Dopp T, PA-C  rosuvastatin (CRESTOR) 10 MG tablet TAKE 1 TABLET BY MOUTH EVERY OTHER DAY 06/30/21   Sherren Mocha, MD      Allergies  Lisinopril    Review of Systems   Review of Systems  Constitutional:  Negative for diaphoresis and fever.  HENT:  Negative for sore throat.   Eyes:  Negative for visual disturbance.  Respiratory:  Positive for cough and shortness of breath. Negative for hemoptysis and sputum production.   Cardiovascular:  Positive for leg swelling. Negative for chest pain.  Gastrointestinal:  Negative for abdominal pain and vomiting.   Genitourinary:  Negative for dysuria.  Musculoskeletal:  Positive for back pain.  Skin:  Negative for rash.  Neurological:  Positive for dizziness. Negative for headaches.   Physical Exam Updated Vital Signs BP (!) 194/122   Pulse 80   Temp 97.8 F (36.6 C)   Resp 18   Ht 5\' 4"  (1.626 m)   Wt 91.6 kg   LMP  (LMP Unknown)   SpO2 97%   BMI 34.67 kg/m  Physical Exam Vitals and nursing note reviewed.  Constitutional:      General: She is not in acute distress.    Appearance: She is well-developed.  HENT:     Head: Normocephalic and atraumatic.  Eyes:     Conjunctiva/sclera: Conjunctivae normal.  Cardiovascular:     Rate and Rhythm: Normal rate and regular rhythm.     Heart sounds: Murmur heard.  Pulmonary:     Effort: Pulmonary effort is normal. No respiratory distress.     Breath sounds: Normal breath sounds.  Abdominal:     Palpations: Abdomen is soft.     Tenderness: There is no abdominal tenderness.  Musculoskeletal:        General: No swelling.     Cervical back: Neck supple.     Right lower leg: No tenderness. Edema present.     Left lower leg: No tenderness. Edema present.     Comments: She has trace edema bilateral lower extremities without cords appreciated  Skin:    General: Skin is warm and dry.     Capillary Refill: Capillary refill takes less than 2 seconds.  Neurological:     General: No focal deficit present.     Mental Status: She is alert.    ED Results / Procedures / Treatments   Labs (all labs ordered are listed, but only abnormal results are displayed) Labs Reviewed - No data to display  EKG None  Radiology No results found.  Procedures Procedures  {Document cardiac monitor, telemetry assessment procedure when appropriate:1}  Medications Ordered in ED Medications - No data to display  ED Course/ Medical Decision Making/ A&P                           Medical Decision Making Amount and/or Complexity of Data Reviewed Labs:  ordered. Radiology: ordered.  This patient complains of ***; this involves an extensive number of treatment Options and is a complaint that carries with it a high risk of complications and morbidity. The differential includes ***  I ordered, reviewed and interpreted labs, which included *** I ordered medication *** and reviewed PMP when indicated. I ordered imaging studies which included *** and I independently    visualized and interpreted imaging which showed *** Additional history obtained from *** Previous records obtained and reviewed *** I consulted *** and discussed lab and imaging findings and discussed disposition.  Cardiac monitoring reviewed, *** Social determinants considered, *** Critical Interventions: ***  After the interventions stated above, I reevaluated the patient and found *** Admission and further testing considered, ***    {  Document critical care time when appropriate:1} {Document review of labs and clinical decision tools ie heart score, Chads2Vasc2 etc:1}  {Document your independent review of radiology images, and any outside records:1} {Document your discussion with family members, caretakers, and with consultants:1} {Document social determinants of health affecting pt's care:1} {Document your decision making why or why not admission, treatments were needed:1} Final Clinical Impression(s) / ED Diagnoses Final diagnoses:  None    Rx / DC Orders ED Discharge Orders     None

## 2022-03-18 NOTE — Discharge Instructions (Signed)
You were seen in the emergency department for elevated blood pressure and increased shortness of breath.  Your labs showed some extra fluid on your lungs and you were given a dose of fluid medication with improvement in your symptoms.  Please continue your regular medications and follow-up with your primary care doctor and your cardiologist.  Low-salt diet.  Return to the emergency department if any worsening or concerning symptoms.  Please follow daily weights and record for your doctor.

## 2022-03-18 NOTE — ED Triage Notes (Signed)
Pt arrived POV from home c/o HTN and feeling dizzy and SHOB. Pt also endorses some back pain between her shoulder blades.

## 2022-03-19 ENCOUNTER — Observation Stay (HOSPITAL_COMMUNITY)
Admission: EM | Admit: 2022-03-19 | Discharge: 2022-03-22 | Disposition: A | Discharge status: Home / Self Care | Payer: Medicare Other | Attending: Internal Medicine | Admitting: Internal Medicine

## 2022-03-19 ENCOUNTER — Emergency Department (HOSPITAL_COMMUNITY): Payer: Medicare Other

## 2022-03-19 ENCOUNTER — Other Ambulatory Visit: Payer: Self-pay

## 2022-03-19 ENCOUNTER — Encounter (HOSPITAL_COMMUNITY): Payer: Self-pay | Admitting: Emergency Medicine

## 2022-03-19 DIAGNOSIS — E039 Hypothyroidism, unspecified: Secondary | ICD-10-CM | POA: Insufficient documentation

## 2022-03-19 DIAGNOSIS — I5021 Acute systolic (congestive) heart failure: Secondary | ICD-10-CM | POA: Diagnosis not present

## 2022-03-19 DIAGNOSIS — I480 Paroxysmal atrial fibrillation: Secondary | ICD-10-CM | POA: Diagnosis not present

## 2022-03-19 DIAGNOSIS — I5030 Unspecified diastolic (congestive) heart failure: Secondary | ICD-10-CM

## 2022-03-19 DIAGNOSIS — Z79899 Other long term (current) drug therapy: Secondary | ICD-10-CM | POA: Diagnosis not present

## 2022-03-19 DIAGNOSIS — Z950 Presence of cardiac pacemaker: Secondary | ICD-10-CM

## 2022-03-19 DIAGNOSIS — I4892 Unspecified atrial flutter: Secondary | ICD-10-CM

## 2022-03-19 DIAGNOSIS — I509 Heart failure, unspecified: Secondary | ICD-10-CM

## 2022-03-19 DIAGNOSIS — I5022 Chronic systolic (congestive) heart failure: Secondary | ICD-10-CM | POA: Insufficient documentation

## 2022-03-19 DIAGNOSIS — E1122 Type 2 diabetes mellitus with diabetic chronic kidney disease: Secondary | ICD-10-CM | POA: Diagnosis not present

## 2022-03-19 DIAGNOSIS — N1831 Chronic kidney disease, stage 3a: Secondary | ICD-10-CM | POA: Diagnosis not present

## 2022-03-19 DIAGNOSIS — J189 Pneumonia, unspecified organism: Secondary | ICD-10-CM | POA: Insufficient documentation

## 2022-03-19 DIAGNOSIS — R001 Bradycardia, unspecified: Secondary | ICD-10-CM

## 2022-03-19 DIAGNOSIS — I10 Essential (primary) hypertension: Secondary | ICD-10-CM

## 2022-03-19 DIAGNOSIS — R42 Dizziness and giddiness: Secondary | ICD-10-CM | POA: Diagnosis present

## 2022-03-19 DIAGNOSIS — I130 Hypertensive heart and chronic kidney disease with heart failure and stage 1 through stage 4 chronic kidney disease, or unspecified chronic kidney disease: Secondary | ICD-10-CM | POA: Diagnosis not present

## 2022-03-19 LAB — CBC WITH DIFFERENTIAL/PLATELET
Abs Immature Granulocytes: 0.01 10*3/uL (ref 0.00–0.07)
Basophils Absolute: 0.1 10*3/uL (ref 0.0–0.1)
Basophils Relative: 1 %
Eosinophils Absolute: 0.2 10*3/uL (ref 0.0–0.5)
Eosinophils Relative: 4 %
HCT: 44.8 % (ref 36.0–46.0)
Hemoglobin: 14.5 g/dL (ref 12.0–15.0)
Immature Granulocytes: 0 %
Lymphocytes Relative: 48 %
Lymphs Abs: 2.7 10*3/uL (ref 0.7–4.0)
MCH: 30 pg (ref 26.0–34.0)
MCHC: 32.4 g/dL (ref 30.0–36.0)
MCV: 92.6 fL (ref 80.0–100.0)
Monocytes Absolute: 0.8 10*3/uL (ref 0.1–1.0)
Monocytes Relative: 14 %
Neutro Abs: 1.9 10*3/uL (ref 1.7–7.7)
Neutrophils Relative %: 33 %
Platelets: 204 10*3/uL (ref 150–400)
RBC: 4.84 MIL/uL (ref 3.87–5.11)
RDW: 13.5 % (ref 11.5–15.5)
WBC: 5.6 10*3/uL (ref 4.0–10.5)
nRBC: 0 % (ref 0.0–0.2)

## 2022-03-19 LAB — TROPONIN I (HIGH SENSITIVITY)
Troponin I (High Sensitivity): 5 ng/L (ref <18)
Troponin I (High Sensitivity): 5 ng/L (ref <18)

## 2022-03-19 LAB — COMPREHENSIVE METABOLIC PANEL
ALT: 23 U/L (ref 0–44)
AST: 28 U/L (ref 15–41)
Albumin: 3.9 g/dL (ref 3.5–5.0)
Alkaline Phosphatase: 40 U/L (ref 38–126)
Anion gap: 10 (ref 5–15)
BUN: 23 mg/dL (ref 8–23)
CO2: 28 mmol/L (ref 22–32)
Calcium: 10.2 mg/dL (ref 8.9–10.3)
Chloride: 100 mmol/L (ref 98–111)
Creatinine, Ser: 1.73 mg/dL — ABNORMAL HIGH (ref 0.44–1.00)
GFR, Estimated: 28 mL/min — ABNORMAL LOW (ref >60)
Glucose, Bld: 125 mg/dL — ABNORMAL HIGH (ref 70–99)
Potassium: 4.1 mmol/L (ref 3.5–5.1)
Sodium: 138 mmol/L (ref 135–145)
Total Bilirubin: 0.8 mg/dL (ref 0.3–1.2)
Total Protein: 7.7 g/dL (ref 6.5–8.1)

## 2022-03-19 LAB — URINALYSIS, ROUTINE W REFLEX MICROSCOPIC
Bilirubin Urine: NEGATIVE
Glucose, UA: NEGATIVE mg/dL
Ketones, ur: NEGATIVE mg/dL
Nitrite: NEGATIVE
Protein, ur: 30 mg/dL — AB
Specific Gravity, Urine: 1.006 (ref 1.005–1.030)
pH: 6 (ref 5.0–8.0)

## 2022-03-19 LAB — BRAIN NATRIURETIC PEPTIDE: B Natriuretic Peptide: 420.9 pg/mL — ABNORMAL HIGH (ref 0.0–100.0)

## 2022-03-19 MED ORDER — INSULIN ASPART 100 UNIT/ML IJ SOLN
0.0000 [IU] | Freq: Three times a day (TID) | INTRAMUSCULAR | Status: DC
  Administered 2022-03-20 – 2022-03-21 (×3): 2 [IU] via SUBCUTANEOUS
  Administered 2022-03-21: 3 [IU] via SUBCUTANEOUS
  Administered 2022-03-22: 2 [IU] via SUBCUTANEOUS

## 2022-03-19 MED ORDER — CEFTRIAXONE SODIUM 1 G IJ SOLR
1.0000 g | Freq: Once | INTRAMUSCULAR | Status: AC
  Administered 2022-03-19: 1 g via INTRAVENOUS
  Filled 2022-03-19: qty 10

## 2022-03-19 MED ORDER — LABETALOL HCL 5 MG/ML IV SOLN
10.0000 mg | Freq: Once | INTRAVENOUS | Status: AC
  Administered 2022-03-19: 10 mg via INTRAVENOUS
  Filled 2022-03-19: qty 4

## 2022-03-19 MED ORDER — ACETAMINOPHEN 500 MG PO TABS
1000.0000 mg | ORAL_TABLET | Freq: Four times a day (QID) | ORAL | Status: DC | PRN
  Administered 2022-03-19 – 2022-03-20 (×2): 1000 mg via ORAL
  Filled 2022-03-19 (×2): qty 2

## 2022-03-19 MED ORDER — LEVOTHYROXINE SODIUM 75 MCG PO TABS
75.0000 ug | ORAL_TABLET | Freq: Every day | ORAL | Status: DC
  Administered 2022-03-20 – 2022-03-22 (×3): 75 ug via ORAL
  Filled 2022-03-19 (×3): qty 1

## 2022-03-19 MED ORDER — LATANOPROST 0.005 % OP SOLN
1.0000 [drp] | Freq: Every day | OPHTHALMIC | Status: DC
  Administered 2022-03-20 – 2022-03-21 (×2): 1 [drp] via OPHTHALMIC
  Filled 2022-03-19: qty 2.5

## 2022-03-19 MED ORDER — METOPROLOL TARTRATE 25 MG PO TABS
25.0000 mg | ORAL_TABLET | Freq: Two times a day (BID) | ORAL | Status: DC
  Administered 2022-03-19: 25 mg via ORAL
  Filled 2022-03-19 (×2): qty 1

## 2022-03-19 MED ORDER — FUROSEMIDE 10 MG/ML IJ SOLN
40.0000 mg | Freq: Once | INTRAMUSCULAR | Status: AC
  Administered 2022-03-19: 40 mg via INTRAVENOUS
  Filled 2022-03-19: qty 4

## 2022-03-19 MED ORDER — SODIUM CHLORIDE 0.9 % IV SOLN
500.0000 mg | Freq: Once | INTRAVENOUS | Status: AC
  Administered 2022-03-19: 500 mg via INTRAVENOUS
  Filled 2022-03-19: qty 5

## 2022-03-19 MED ORDER — ROSUVASTATIN CALCIUM 5 MG PO TABS
10.0000 mg | ORAL_TABLET | ORAL | Status: DC
  Administered 2022-03-20 – 2022-03-22 (×2): 10 mg via ORAL
  Filled 2022-03-19 (×2): qty 2

## 2022-03-19 MED ORDER — APIXABAN 5 MG PO TABS
5.0000 mg | ORAL_TABLET | Freq: Two times a day (BID) | ORAL | Status: DC
  Administered 2022-03-19 – 2022-03-22 (×6): 5 mg via ORAL
  Filled 2022-03-19 (×6): qty 1

## 2022-03-19 NOTE — ED Triage Notes (Signed)
Pt arrives c/o ongoing HTN x2-3 weeks. Pt got lasix here yesterday. Daughter reports BP [id number] systolic, EMS reports [id number] initially, now 164/118. Denies CP, SOB. Occasional dizziness on standing, none today. No orthostatic changes. No neuro symptoms. Other VS WNL. 12 lead shows aflutter/LBBB.

## 2022-03-19 NOTE — ED Provider Notes (Signed)
[facility] EMERGENCY DEPARTMENT Provider Note   CSN: [id number] Arrival date & time: 03/19/22  1604     History  No chief complaint on file.   [name] is a 88 y.o. female.  HPI 88 year old female presents with uncontrolled hypertension.  Patient was seen here yesterday for similar symptoms.  EMS was called because granddaughter checked her blood pressure and it was [id number] systolic per the paramedic.  Patient denies acute complaints at first though tells me she did have a slight headache today which got better with Tylenol.  When asked about shortness of breath she initially denies it but then does admit that minimal exertion makes her short of breath.  It is better than yesterday but still bothersome.  No chest pain.  No focal weakness or numbness or visual complaints.  She reports compliance with her blood pressure medicines which were recently adjusted on 5/23.  Has a past medical history significant for severe aortic stenosis status post aortic valve replacement  Home Medications Prior to Admission medications   Medication Sig Start Date End Date Taking? Authorizing Provider  acetaminophen (TYLENOL) 500 MG tablet Take 500 mg by mouth every 6 (six) hours as needed for mild pain, moderate pain or headache.     [provider]  amoxicillin (AMOXIL) 500 MG capsule Take four capsules one hour before dental appointment. 04/22/20   Eileen Stanford, PA-C  apixaban (ELIQUIS) 5 MG TABS tablet Take 1 tablet (5 mg total) by mouth 2 (two) times daily. Overdue for follow-up, MUST see MD for FUTURE refills. 02/21/22   Sherren Mocha, MD  Calcium Carb-Cholecalciferol (CALCIUM 600 + D PO) Take 1 tablet by mouth daily.    [provider]  latanoprost (XALATAN) 0.005 % ophthalmic solution SMARTSIG:In Eye(s) 02/14/21   [provider]  levothyroxine (SYNTHROID, LEVOTHROID) 75 MCG tablet Take 75 mcg by mouth daily. 08/08/16   [provider]   losartan-hydrochlorothiazide (HYZAAR) 100-12.5 MG tablet Take 1 tablet by mouth daily. 03/16/22   Wayland Denis, MD  metoprolol tartrate (LOPRESSOR) 25 MG tablet Take 1 tablet (25 mg total) by mouth 2 (two) times daily. Please keep upcoming appointment for future refills. Thank you. 03/13/22   Richardson Dopp T, PA-C  rosuvastatin (CRESTOR) 10 MG tablet TAKE 1 TABLET BY MOUTH EVERY OTHER DAY 06/30/21   Sherren Mocha, MD      Allergies    Lisinopril    Review of Systems   Review of Systems  Respiratory:  Positive for shortness of breath.   Cardiovascular:  Positive for leg swelling. Negative for chest pain.  Neurological:  Positive for headaches. Negative for weakness and numbness.   Physical Exam Updated Vital Signs BP (!) 179/109   Pulse 63   Temp 97.9 F (36.6 C)   Resp (!) 26   LMP  (LMP Unknown)   SpO2 98%  Physical Exam Vitals and nursing note reviewed.  Constitutional:      Appearance: She is well-developed.  HENT:     Head: Normocephalic and atraumatic.  Eyes:     Pupils: Pupils are equal, round, and reactive to light.  Cardiovascular:     Rate and Rhythm: Normal rate and regular rhythm.     Heart sounds: Normal heart sounds.  Pulmonary:     Effort: Pulmonary effort is normal.     Breath sounds: Normal breath sounds.  Abdominal:     Palpations: Abdomen is soft.     Tenderness: There is no abdominal tenderness.  Musculoskeletal:     Right lower leg: Edema present.     Left lower leg: Edema present.     Comments: Mild non-pitting edema to BLE  Skin:    General: Skin is warm and dry.  Neurological:     Mental Status: She is alert.     Comments: No slurred speech or facial droop. 5/5 strength in all 4 extremities    ED Results / Procedures / Treatments   Labs (all labs ordered are listed, but only abnormal results are displayed) Labs Reviewed  COMPREHENSIVE METABOLIC PANEL - Abnormal; Notable for the following components:      Result Value   Glucose, Bld 125  (*)    Creatinine, Ser 1.73 (*)    GFR, Estimated 28 (*)    All other components within normal limits  BRAIN NATRIURETIC PEPTIDE - Abnormal; Notable for the following components:   B Natriuretic Peptide 420.9 (*)    All other components within normal limits  CULTURE, BLOOD (ROUTINE X 2)  CULTURE, BLOOD (ROUTINE X 2)  CBC WITH DIFFERENTIAL/PLATELET  TROPONIN I (HIGH SENSITIVITY)  TROPONIN I (HIGH SENSITIVITY)    EKG EKG Interpretation  Date/Time:  Sunday Mar 19 2022 16:11:47 EDT Ventricular Rate:  82 PR Interval:  229 QRS Duration: 162 QT Interval:  420 QTC Calculation: 491 R Axis:   117 Text Interpretation: Sinus rhythm Prolonged PR interval Nonspecific intraventricular conduction delay Abnormal lateral Q waves Probable anteroseptal infarct, recent Confirmed by Sherwood Gambler [id number]) on 03/19/2022 5:05:18 PM  Radiology DG Chest 2 View  Result Date: 03/19/2022 CLINICAL DATA:  Shortness of breath. EXAM: CHEST - 2 VIEW COMPARISON:  AP chest 03/18/2022 chest two views 04/18/2019 FINDINGS: Cardiac silhouette is again markedly enlarged. Postsurgical changes are seen of aortic valve stent prosthesis. Moderate calcification within the aortic arch. There is heterogeneous opacification overlying the heart and localized to the left lower lobe on lateral view suspicious for atelectasis versus pneumonia. This is new compared to lateral radiograph 04/18/2019. No definite pleural effusion. No pneumothorax. Moderate multilevel degenerative disc changes of the thoracic spine. IMPRESSION: 1. Markedly enlarged cardiac silhouette. 2. Left lower lobe heterogeneous airspace opacification, possible atelectasis versus pneumonia. Electronically Signed   By: Yvonne Kendall M.D.   On: 03/19/2022 16:58   DG Chest Port 1 View  Result Date: 03/18/2022 CLINICAL DATA:  Shortness of breath EXAM: PORTABLE CHEST 1 VIEW COMPARISON:  04/22/2019 FINDINGS: Transverse diameter of heart is increased. Stent is noted in the  region of aortic valve. Central pulmonary vessels are prominent. There are no signs of alveolar pulmonary edema. There is blunting of lateral CP angles. There is no focal consolidation. There is no pneumothorax. IMPRESSION: Cardiomegaly. Central pulmonary vessels are prominent suggesting mild CHF. Minimal bilateral pleural effusions. There is no focal pulmonary consolidation. Electronically Signed   By: Elmer Picker M.D.   On: 03/18/2022 16:25    Procedures Procedures    Medications Ordered in ED Medications  cefTRIAXone (ROCEPHIN) 1 g in sodium chloride 0.9 % 100 mL IVPB (has no administration in time range)  azithromycin (ZITHROMAX) 500 mg in sodium chloride 0.9 % 250 mL IVPB (has no administration in time range)  labetalol (NORMODYNE) injection 10 mg (10 mg Intravenous Given 03/19/22 1834)    ED Course/ Medical Decision Making/ A&P                           Medical Decision Making Amount and/or Complexity of Data Reviewed  Independent Historian: EMS    Details: daughter External Data Reviewed: notes. Labs: ordered. Radiology: ordered and independent interpretation performed. ECG/medicine tests: ordered and independent interpretation performed.  Risk Prescription drug management. Decision regarding hospitalization.   Patient is well-appearing but does have elevated blood pressure.  I reviewed the notes from earlier in the week as well as yesterday.  Patient does not have any strokelike symptoms and the headache she had earlier has resolved.  Chest x-ray images viewed by myself and there is either pneumonia versus atelectasis in the left lower lung.  However when I talked to patient she endorses a chronic but not new or worsening cough.  Given this with the x-ray findings I think it be reasonable to treat for pneumonia.  She does not have an elevated WBC on interpretation of her labs but I think the x-ray is concerning enough and she seems to have symptoms so we will give Rocephin  and azithromycin.  Otherwise ECG does not show acute ischemia on my interpretation.  Labs are otherwise relatively unremarkable besides a mild bump in her creatinine.  I have given her a small dose of labetalol as her blood pressure went back up to 200.  I have consulted the internal medicine teaching service for admission.        Final Clinical Impression(s) / ED Diagnoses Final diagnoses:  Uncontrolled hypertension  Community acquired pneumonia of left lower lobe of lung    Rx / DC Orders ED Discharge Orders     None         Sherwood Gambler, MD 03/19/22 2011

## 2022-03-19 NOTE — Progress Notes (Signed)
03/19/2022  2200 Received pt to room 4E-19 from ED with DX Pneumonia.  Pt is A&O, no C/O voiced.  Tele monitor applied and CCMD notified.  CHG bath given.  Oriented to room, call light and bed.  Call bell in reach, family at bedside. Kathryne Hitch

## 2022-03-19 NOTE — ED Notes (Signed)
Admitting MD at bedside.

## 2022-03-19 NOTE — ED Notes (Signed)
Transport put in for pt  °
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
## 2022-03-20 ENCOUNTER — Other Ambulatory Visit: Payer: Self-pay

## 2022-03-20 ENCOUNTER — Observation Stay (HOSPITAL_BASED_OUTPATIENT_CLINIC_OR_DEPARTMENT_OTHER): Payer: Medicare Other

## 2022-03-20 DIAGNOSIS — I5021 Acute systolic (congestive) heart failure: Secondary | ICD-10-CM | POA: Diagnosis not present

## 2022-03-20 DIAGNOSIS — I110 Hypertensive heart disease with heart failure: Secondary | ICD-10-CM | POA: Diagnosis not present

## 2022-03-20 DIAGNOSIS — R0609 Other forms of dyspnea: Secondary | ICD-10-CM | POA: Diagnosis not present

## 2022-03-20 DIAGNOSIS — Z79899 Other long term (current) drug therapy: Secondary | ICD-10-CM | POA: Diagnosis not present

## 2022-03-20 DIAGNOSIS — I5030 Unspecified diastolic (congestive) heart failure: Secondary | ICD-10-CM

## 2022-03-20 DIAGNOSIS — J189 Pneumonia, unspecified organism: Secondary | ICD-10-CM | POA: Diagnosis not present

## 2022-03-20 DIAGNOSIS — I4892 Unspecified atrial flutter: Secondary | ICD-10-CM | POA: Diagnosis not present

## 2022-03-20 LAB — ECHOCARDIOGRAM COMPLETE
AR max vel: 1.54 cm2
AV Area VTI: 1.43 cm2
AV Area mean vel: 1.4 cm2
AV Mean grad: 7 mmHg
AV Peak grad: 13 mmHg
Ao pk vel: 1.81 m/s
Height: 64 in
S' Lateral: 3.6 cm
Single Plane A4C EF: 39.6 %
Weight: 3153.46 oz

## 2022-03-20 LAB — CBC
HCT: 43.8 % (ref 36.0–46.0)
Hemoglobin: 14.4 g/dL (ref 12.0–15.0)
MCH: 29.9 pg (ref 26.0–34.0)
MCHC: 32.9 g/dL (ref 30.0–36.0)
MCV: 90.9 fL (ref 80.0–100.0)
Platelets: 193 10*3/uL (ref 150–400)
RBC: 4.82 MIL/uL (ref 3.87–5.11)
RDW: 13.4 % (ref 11.5–15.5)
WBC: 5.7 10*3/uL (ref 4.0–10.5)
nRBC: 0 % (ref 0.0–0.2)

## 2022-03-20 LAB — BASIC METABOLIC PANEL
Anion gap: 12 (ref 5–15)
BUN: 24 mg/dL — ABNORMAL HIGH (ref 8–23)
CO2: 23 mmol/L (ref 22–32)
Calcium: 9.8 mg/dL (ref 8.9–10.3)
Chloride: 105 mmol/L (ref 98–111)
Creatinine, Ser: 1.56 mg/dL — ABNORMAL HIGH (ref 0.44–1.00)
GFR, Estimated: 32 mL/min — ABNORMAL LOW (ref >60)
Glucose, Bld: 135 mg/dL — ABNORMAL HIGH (ref 70–99)
Potassium: 4 mmol/L (ref 3.5–5.1)
Sodium: 140 mmol/L (ref 135–145)

## 2022-03-20 LAB — GLUCOSE, CAPILLARY
Glucose-Capillary: 120 mg/dL — ABNORMAL HIGH (ref 70–99)
Glucose-Capillary: 142 mg/dL — ABNORMAL HIGH (ref 70–99)
Glucose-Capillary: 126 mg/dL — ABNORMAL HIGH (ref 70–99)
Glucose-Capillary: 149 mg/dL — ABNORMAL HIGH (ref 70–99)

## 2022-03-20 MED ORDER — METOPROLOL TARTRATE 12.5 MG HALF TABLET: 12.5000 mg | ORAL_TABLET | Freq: Two times a day (BID) | ORAL | Status: DC

## 2022-03-20 MED ORDER — LOSARTAN POTASSIUM 50 MG PO TABS
100.0000 mg | ORAL_TABLET | Freq: Every day | ORAL | Status: DC
  Administered 2022-03-20: 100 mg via ORAL
  Filled 2022-03-20 (×2): qty 2

## 2022-03-20 MED ORDER — HYDROCHLOROTHIAZIDE 12.5 MG PO TABS
12.5000 mg | ORAL_TABLET | Freq: Every day | ORAL | Status: DC
Start: 2022-03-20 — End: 2022-03-21
  Administered 2022-03-20: 12.5 mg via ORAL
  Filled 2022-03-20: qty 1

## 2022-03-20 MED ORDER — LOSARTAN POTASSIUM-HCTZ 100-12.5 MG PO TABS: 1.0000 | ORAL_TABLET | Freq: Every day | ORAL | Status: DC

## 2022-03-20 MED ORDER — ASPIRIN-ACETAMINOPHEN-CAFFEINE 250-250-65 MG PO TABS
1.0000 | ORAL_TABLET | Freq: Once | ORAL | Status: AC
  Administered 2022-03-21: 1 via ORAL
  Filled 2022-03-20: qty 1

## 2022-03-20 NOTE — Evaluation (Signed)
Occupational Therapy Evaluation Patient Details Name: [name] MRN: [id number] DOB: 1931/08/25 Today's Date: 03/20/2022   History of Present Illness Patient is a 88 yo female admitted on 03/19/22 due to dizziness and SOB. Presenting with acute exacerbation of CHF and uncontrolled HTN. PMH includes paroxysmal a fib, CKD stage 3.   Clinical Impression   Prior to this admission, patient living independently, still driving, and requiring no assist with ADLs or IADLs. Patient's son would check on her daily and if he didn't visit, would call on the days he was unable to make it to the house. Patient independent with bed mobility and set up for ADLs currently, unable to assess transfers due to ECHO needing to complete assessment (please refer to PT note). Of note, BP assessed in sitting EOB at 188/74 (106) and HR as low as 31 (asymptomatic throughout) with RN notified after session. OT not recommending services post discharge, OT will continue to follow acutely.      Recommendations for follow up therapy are one component of a multi-disciplinary discharge planning process, led by the attending physician.  Recommendations may be updated based on patient status, additional functional criteria and insurance authorization.   Follow Up Recommendations  No OT follow up    Assistance Recommended at Discharge PRN  Patient can return home with the following Assistance with cooking/housework;Assist for transportation;Help with stairs or ramp for entrance    Functional Status Assessment  Patient has had a recent decline in their functional status and demonstrates the ability to make significant improvements in function in a reasonable and predictable amount of time.  Equipment Recommendations  Other (comment) (Will continue to assess but likely will not require DME)    Recommendations for Other Services       Precautions / Restrictions Precautions Precautions: Fall;Other (comment) Precaution  Comments: watch bp Restrictions Weight Bearing Restrictions: No      Mobility Bed Mobility Overal bed mobility: Modified Independent                  Transfers                   General transfer comment: unable to assess due to ECHO needing to do procedure      Balance Overall balance assessment: No apparent balance deficits (not formally assessed)                                         ADL either performed or assessed with clinical judgement   ADL Overall ADL's : Needs assistance/impaired Eating/Feeding: Set up;Sitting   Grooming: Set up;Sitting   Upper Body Bathing: Set up;Sitting   Lower Body Bathing: Set up;Sitting/lateral leans   Upper Body Dressing : Set up;Sitting   Lower Body Dressing: Set up;Sitting/lateral leans   Toilet Transfer: Min guard;Ambulation   Toileting- Clothing Manipulation and Hygiene: Min guard;Sitting/lateral lean       Functional mobility during ADLs: Min guard General ADL Comments: Patient presenting with minimal decreased activity tolerance, with varying HR and high BP     Vision Baseline Vision/History: 1 Wears glasses (Readers) Ability to See in Adequate Light: 0 Adequate Patient Visual Report: No change from baseline       Perception     Praxis      Pertinent Vitals/Pain Pain Assessment Pain Assessment: No/denies pain     Hand Dominance Right   Extremity/Trunk Assessment  Upper Extremity Assessment Upper Extremity Assessment: Generalized weakness   Lower Extremity Assessment Lower Extremity Assessment: Defer to PT evaluation   Cervical / Trunk Assessment Cervical / Trunk Assessment: Kyphotic (minimally)   Communication Communication Communication: No difficulties   Cognition Arousal/Alertness: Awake/alert Behavior During Therapy: WFL for tasks assessed/performed Overall Cognitive Status: Within Functional Limits for tasks assessed                                        General Comments  BP assessed ar 188/74 (106) in sitting, HR as low as 31 seated EOB    Exercises     Shoulder Instructions      Home Living Family/patient expects to be discharged to:: Private residence Living Arrangements: Alone;Other (Comment) (has been living with daughter for past 3 days) Available Help at Discharge: Family;Available PRN/intermittently Type of Home: House Home Access: Stairs to enter [employer] of Steps: 3 at the front, with a rail Entrance Stairs-Rails: Right;Left;Can reach both Home Layout: One level     Bathroom Shower/Tub: Teacher, early years/pre: Standard Bathroom Accessibility: Yes   Home Equipment: None          Prior Functioning/Environment Prior Level of Function : Independent/Modified Independent;Driving             Mobility Comments: no falls reported, moved in with daughter in the last 3 days ADLs Comments: still driving, completing all ADLs and IADLs independently, likes to garden        OT Problem List: Decreased strength;Decreased activity tolerance      OT Treatment/Interventions: Self-care/ADL training;Therapeutic exercise;Energy conservation;DME and/or AE instruction;Manual therapy;Patient/family education;Balance training;Therapeutic activities    OT Goals(Current goals can be found in the care plan section) Acute Rehab OT Goals Patient Stated Goal: to feel better OT Goal Formulation: With patient Time For Goal Achievement: 04/03/22 Potential to Achieve Goals: Good ADL Goals Pt Will Perform Lower Body Bathing: Independently Pt Will Perform Lower Body Dressing: Independently Pt Will Transfer to Toilet: ambulating Additional ADL Goal #1: Patient will demonstrate ability to complete functional task in standing for 3-5 minutes without need for seated rest break in order to promote independence with ADLs and IADLs.  OT Frequency: Min 2X/week    Co-evaluation              AM-PAC OT  "6 Clicks" Daily Activity     Outcome Measure Help from another person eating meals?: None Help from another person taking care of personal grooming?: None Help from another person toileting, which includes using toliet, bedpan, or urinal?: A Little Help from another person bathing (including washing, rinsing, drying)?: A Little Help from another person to put on and taking off regular upper body clothing?: A Little Help from another person to put on and taking off regular lower body clothing?: A Little 6 Click Score: 20   End of Session Nurse Communication: Mobility status;Other (comment) (BP and HR)  Activity Tolerance: Patient tolerated treatment well Patient left: in bed;with call bell/phone within reach;with family/visitor present  OT Visit Diagnosis: Muscle weakness (generalized) (M62.81)                Time: [id number] OT Time Calculation (min): 13 min Charges:  OT General Charges $OT Visit: 1 Visit OT Evaluation $OT Eval Moderate Complexity: 1 Mod  Corinne Ports E. Jaela Yepez, OTR/L Acute Rehabilitation Services [phone] [place] 03/20/2022,  1:05 PM

## 2022-03-20 NOTE — Progress Notes (Signed)
Subjective:  Overnight:NAEON  States doing well. No chest pain, shortness of breath or headaches. States dizziness has resolved. No acute complaints endorsed.   Objective:  Vital signs in last 24 hours: Vitals:   03/19/22 2245 03/19/22 2248 03/19/22 2349 03/20/22 0455  BP: (!) 192/94 (!) 188/106 (!) 155/87 (!) 182/66  Pulse: 78 78 (!) 41 70  Resp:  18 (!) 23 20  Temp:   97.8 F (36.6 C) 97.8 F (36.6 C)  TempSrc:   Oral Oral  SpO2:   97% 99%  Weight:      Height:       Supplemental O2: Room Air SpO2: 99 % Filed Weights   03/19/22 2213  Weight: 89.4 kg    Intake/Output Summary (Last 24 hours) at 03/20/2022 [id number] Last data filed at 03/20/2022 [id number] Gross per 24 hour  Intake --  Output 600 ml  Net -600 ml   Net IO Since Admission: -600 mL [03/20/22 0708] Physical Exam General: Elderly female in NAD laying in bed comfortably HENT: NCAT Lungs:  CTAB, no wheeze, no rales, no rhonchi Cardiovascular: Bradycardic, sinus rhythm, systolic murmur present, 2+ radial pulses, trace LE edema Abdomen: No ttp, normal bowel sounds MSK: no asymmetry Skin: no lesions on exposed skin Neuro: alert and oriented x4 Psych: normal mood and normal affect  Diagnostics CBC unremarkable. BMP shows normal electrolytes, creatinine elevated at 1.56, BUN at 24.   Assessment/Plan: [name] is a 88 y.o. with PMH of paroxysmal a. Fib, HFpEF, HTN presenting with dizziness and dyspnea found to have elevated blood pressures on admission, elevated BNP found to have CHF exacerbation.   Principal Problem:   Acute exacerbation of CHF (congestive heart failure) ([place])  #HFpEF (EF 50-60% 7/22) exacerbation #Hypertension #CAD Overall dyspnea and dizziness improved. Only 600 cc output noted after IV lasix 40 mg in the ED. She was hypertensive in 170s/60s.  We will restart her home losartan-HCTZ 100-12.5 mg daily and hold her metoprolol given her bradycardia. Given low gfr she may benefit from po  lasix as part of her regimen. Other agents including SGLT2, BiDil can be considered in this patient. Her last echocardiogram was about 1 year ago. She has one pending this admission.  -f/u Echocardiogram -Strict I's and O's -Daily weights -continue rosuvastatin 10 mg   #Dizziness Patient describes dizziness with standing over the last 2 weeks. She endorsed improvement this am. Orthostatics were negative but she is no longer complaining of dizziness. Could be secondary to long standing uncontrolled hypertension. It could be due to BPPV so we will evaluate her for this.  -PT/OT evaluation -CTM  #AKI on CKD stage IIIa Baseline creatinine around 1.3.  Creatinine was elevated at 1.73 from 1.28 on presentation but improved to 1.48 this am. Most likely secondary to decreased renal perfusion 2/2 to congestion. UA shows proteinuria and small leukocytes but no nitrites.  -Avoid nephrotoxic medications -Trend BMP -strict Is and Os    #Aortic stenosis status post TAVR TAVR in June 2020. She follows with [facility] heart care and last office visit was July 2022.  Last echo 7/22 and showed EF of 55 to 60% with moderately elevated pulmonary artery pressure, mild perivalvular aortic regurg. Repeat echo pending.  -fu repeat echo   #Paroxysmal atrial fibrillation #Left bundle branch block EKG showed sinus rhythm with prolonged PR interval at 229. Medications include metoprolol tartrate 25 mg twice daily and Eliquis 5 mg twice daily.  LBBB was first seen on EKG in July 2022.  Myoview stress test completed after this and was normal.  -Will hold metoprolol today given bradycardia and potentially cut dose in half.  -CTM pts heart rate on tele   #Type 2 diabetes mellitus HgbA1c of 7.2 on 03/16/22.  She does not take any medications for her diabetes. SGLT2 inhibitor will help with this along with her other co-morbidities. -Continue SSI -CTM  #Hypothyroidism TSH of 7 at PCP visit last week.  To early to repeat  again. Home medications include levothyroxine 75 mcg. -Continue levothyroxine 75 mcg` -We will recommend repeating outpatient   #Glaucoma Patient is include Latanoprost drops. -continue home drops    Diet: Carbs/HH diet IVF: None VTE: Eliquis Code: DNR PT/OT recs: Pending Prior to Admission Living Arrangement:Home Anticipated Discharge Location: Home Barriers to Discharge: Medical workup Dispo: Anticipated discharge in approximately 1 day(s).   Idamae Schuller, MD Tillie Rung. [facility] Internal Medicine Residency, PGY-1  Pager: [phone] After 5 pm and on weekends: Please call the on-call pager

## 2022-03-20 NOTE — TOC Initial Note (Signed)
Transition of Care [facility]) - Initial/Assessment Note    Patient Details  Name: [name] MRN: [id number] Date of Birth: 01-Apr-1931  Transition of Care [facility]) CM/SW Contact:    Ninfa Meeker, RN Phone Number: 03/20/2022, 2:17 PM  Clinical Narrative:                 Transition of Care Screening Note:  Transition of Care Department [facility]) has reviewed patient and no TOC needs have been identified at this time. We will continue to monitor patient advancement through Interdisciplinary progressions. If new patient transition needs arise, please place a consult.          Patient Goals and CMS Choice        Expected Discharge Plan and Services                                                Prior Living Arrangements/Services                       Activities of Daily Living      Permission Sought/Granted                  Emotional Assessment              Admission diagnosis:  Acute exacerbation of CHF (congestive heart failure) ([employer]) [I50.9] Uncontrolled hypertension [I10] Community acquired pneumonia of left lower lobe of lung [J18.9] Patient Active Problem List   Diagnosis Date Noted   Acute exacerbation of CHF (congestive heart failure) ([place]) 03/19/2022   Stage 3a chronic kidney disease (CKD) ([place]) 03/17/2022   Healthcare maintenance 03/17/2022   Hyperthyroidism 03/17/2022   Severe aortic stenosis s/p TAVR 04/22/2019   Persistent atrial fibrillation 04/03/2016   Hypertension 11/19/2013   Type II diabetes mellitus ([place]) 03/05/2013   Hyperlipidemia    LVH (left ventricular hypertrophy)    Aortic stenosis    PCP:  Lottie Mussel, MD Pharmacy:   [facility] [place] [address] Phone: [phone] Fax: [phone]     Social Determinants of Health (SDOH) Interventions    Readmission Risk Interventions     View : No data  to display.

## 2022-03-20 NOTE — Progress Notes (Signed)
  Echocardiogram 2D Echocardiogram has been performed.  [name] 03/20/2022, 12:31 PM

## 2022-03-20 NOTE — Progress Notes (Signed)
Pt HR sustaining in 30-40s AF/AFL.  Will fluctuate up to 70s, but mostly bradycardic.  Pt asymptomatic.  BP 166/57.  Echo just completed.  IM Residents notified.

## 2022-03-20 NOTE — Progress Notes (Incomplete)
Heart Failure Stewardship Pharmacist Progress Note   PCP: Mercie Eon, MD PCP-Cardiologist: Tonny Bollman, MD    HPI:  88 yo female  Current HF Medications: {HF MEDS CURRENT:26665}  Prior to admission HF Medications: {HF MEDS PTA:26664}  Pertinent Lab Values: Serum creatinine ***, BUN ***, Potassium ***, Sodium ***, BNP ***, Magnesium ***, A1c ***, Digoxin ***  Creat  Date Value Ref Range Status  07/17/2016 1.48 (H) 0.60 - 0.88 mg/dL Final    Comment:      For patients > or = 86 years of age: The upper reference limit for Creatinine is approximately 13% higher for people identified as African-American.     07/07/2016 1.64 (H) 0.60 - 0.88 mg/dL Final    Comment:      For patients > or = 86 years of age: The upper reference limit for Creatinine is approximately 13% higher for people identified as African-American.      Creatinine, Ser  Date Value Ref Range Status  03/20/2022 1.56 (H) 0.44 - 1.00 mg/dL Final  73/53/2992 4.26 (H) 0.44 - 1.00 mg/dL Final   BUN  Date Value Ref Range Status  03/20/2022 24 (H) 8 - 23 mg/dL Final  83/41/9622 23 8 - 23 mg/dL Final  29/79/8921 22 8 - 27 mg/dL Final  19/41/7408 19 8 - 27 mg/dL Final   Potassium  Date Value Ref Range Status  03/20/2022 4.0 3.5 - 5.1 mmol/L Final  03/19/2022 4.1 3.5 - 5.1 mmol/L Final   Sodium  Date Value Ref Range Status  03/20/2022 140 135 - 145 mmol/L Final  03/19/2022 138 135 - 145 mmol/L Final  03/16/2022 140 134 - 144 mmol/L Final  07/06/2021 141 134 - 144 mmol/L Final   B Natriuretic Peptide  Date Value Ref Range Status  03/19/2022 420.9 (H) 0.0 - 100.0 pg/mL Final    Comment:    Performed at [facility], [address] [place], [place] [address]  03/18/2022 485.6 (H) 0.0 - 100.0 pg/mL Final    Comment:    Performed at [facility], [address], [place], [place] [address]   Magnesium  Date Value Ref Range Status  03/18/2022 1.9 1.7 - 2.4 mg/dL Final    Comment:     Performed at [facility], [address] [place], [place] [address]  04/23/2019 1.7 1.7 - 2.4 mg/dL Final    Comment:    Performed at [facility], [address] [place], [place] [address]   Hgb A1c MFr Bld  Date Value Ref Range Status  04/18/2019 7.2 (H) 4.8 - 5.6 % Final    Comment:    (NOTE)         Prediabetes: 5.7 - 6.4         Diabetes: >6.4         Glycemic control for adults with diabetes: <7.0     Vital Signs: Weight: *** lbs (admission weight: *** lbs) Discharge weight 05/27 in ED 201.5 lbs Blood pressure: ***  Heart rate: ***  I/O: -***L yesterday; net -***L  Net IO Since Admission: -600 mL [03/20/22 1250]   Temp:  [97.8 F (36.6 C)-98.5 F (36.9 C)] 97.9 F (36.6 C) (05/29 0744) Pulse Rate:  [41-81] 76 (05/29 0846) Resp:  [17-27] 17 (05/29 0846) BP: (152-210)/(65-128) 177/84 (05/29 0846) SpO2:  [94 %-100 %] 95 % (05/29 0846) Weight:  [89.4 kg (197 lb 1.5 oz)] 89.4 kg (197 lb 1.5 oz) (05/28 2213) Filed Weights  03/19/22 2213  Weight: 89.4 kg (197 lb 1.5 oz)    Intake/Output Summary (Last 24 hours) at 03/20/2022 1250 Last data filed at 03/20/2022 0516 Gross per 24 hour  Intake --  Output 500 ml  Net -500 ml      Medication Assistance / Insurance Benefits Check: Does the patient have prescription insurance?  {Yes/No/Pending:24180} Type of insurance plan: ***  Does the patient qualify for medication assistance through manufacturers or grants?   {Yes/No/Pending:24180} Eligible grants and/or patient assistance programs: *** Medication assistance applications in progress: ***  Medication assistance applications approved: *** Approved medication assistance renewals will be completed by: ***  Outpatient Pharmacy:  Prior to admission outpatient pharmacy: *** Is the patient willing to use [facility] at discharge? {Yes/No/Pending:24180} Is the patient willing to transition their outpatient pharmacy to utilize a [facility] outpatient  pharmacy?   {Yes/No/Pending:24180}    Assessment: 1. Acute on ***chronic ***systolic CHF (LVEF ***%), due to ***. NYHA class *** symptoms. - {HF MEDS ASSESS:27516::"***"}   Plan: 1) Medication changes recommended at this time: -  2) Patient assistance: -  3)  Education  - To be completed prior to discharge  Laurey Arrow, PharmD PGY1 Pharmacy Resident 03/20/2022  12:50 PM

## 2022-03-20 NOTE — Progress Notes (Signed)
Got a page regarding this patient for persistent bradycardia with values ranging from 30s-50s. Patient is asymptomatic and resting comfortably per nurse report. Her HR was noted in the 30s when she was working with OT but remained asymptomatic at that time as well. EKG was ordered and showed HR of 39 with atrial flutter with non-specific intra-ventricular conduction block.  Plan: -We will discontinue metoprolol and restart tomorrow if appropriate -If HR remains persistently low <50s or patient becomes symptomatic then consult cardiology.   Gwenevere Abbot, MD Eligha Bridegroom. [facility] Internal Medicine Residency, PGY-1

## 2022-03-20 NOTE — Evaluation (Signed)
Physical Therapy Evaluation Patient Details Name: [name] MRN: [id number] DOB: 10/03/1931 Today's Date: 03/20/2022  History of Present Illness  Patient is a 88 yo female admitted on 03/19/22 due to dizziness and SOB. Presenting with acute exacerbation of CHF and uncontrolled HTN. PMH includes paroxysmal a fib, CKD stage 3.  Clinical Impression  Pt admitted with dyspnea on exertion and dizziness with activity, currently presenting with acute exacerbation of CHF. Prior to admission, pt recently moved in with daughter for assistance with mobility, transfers, ADLs, and IADLs but was previously independent. Today, pt presents with decreased gait speed and stride length, in addition to mild balance deficits. No dizziness reported in session, but with elevated blood pressure following activity. HR ranging from 39-85 bpm. RN notified. Pt education on use of RW for balance and ambulation, however demonstrated min guard during ambulation and demonstrated safety with discontinuing RW. Will follow acutely to address mild balance deficits and return to PLOF.     Recommendations for follow up therapy are one component of a multi-disciplinary discharge planning process, led by the attending physician.  Recommendations may be updated based on patient status, additional functional criteria and insurance authorization.  Follow Up Recommendations No PT follow up    Assistance Recommended at Discharge PRN  Patient can return home with the following  A little help with walking and/or transfers;A little help with bathing/dressing/bathroom;Assistance with cooking/housework;Assist for transportation;Help with stairs or ramp for entrance    Equipment Recommendations Rolling walker (2 wheels) (pending progress)  Recommendations for Other Services       Functional Status Assessment Patient has had a recent decline in their functional status and demonstrates the ability to make significant improvements in  function in a reasonable and predictable amount of time.     Precautions / Restrictions Precautions Precautions: Fall;Other (comment) Precaution Comments: watch bp Restrictions Weight Bearing Restrictions: No      Mobility  Bed Mobility Overal bed mobility: Modified Independent             General bed mobility comments: increased time, didn't use rails or need cues    Transfers Overall transfer level: Modified independent Equipment used: Rolling walker (2 wheels)               General transfer comment: stood from EOBx1, stood from toilet x1, provided cues for hand placement on walker and bed    Ambulation/Gait Ambulation/Gait assistance: Min guard Gait Distance (Feet): 75 Feet Assistive device: Rolling walker (2 wheels) Gait Pattern/deviations: Shuffle, Decreased stride length Gait velocity: 1.9ft/sec Gait velocity interpretation: <1.31 ft/sec, indicative of household ambulator   General Gait Details: pt started ambulation with RW for safety, went to bathroom without and was able to navigate lines using SLS without support. required cues for keeping walker close, had never used one. pt maintained good posture  Stairs            Wheelchair Mobility    Modified Rankin (Stroke Patients Only)       Balance Overall balance assessment: Needs assistance Sitting-balance support: No upper extremity supported, Feet supported Sitting balance-Leahy Scale: Good Sitting balance - Comments: able to adjust socks while seated     Standing balance-Leahy Scale: Good Standing balance comment: able to navigate lines with SLS bilaterally without LOB, still needed min guard                             Pertinent Vitals/Pain Pain Assessment Pain Assessment:  No/denies pain    Home Living Family/patient expects to be discharged to:: Private residence Living Arrangements: Alone;Other (Comment) (has been living with daughter for past 3 days) Available Help  at Discharge: Family;Available PRN/intermittently Type of Home: House Home Access: Stairs to enter Entrance Stairs-Rails: Right;Left;Can reach both Entrance Stairs-Number of Steps: 3 at the front, with a rail   Home Layout: One level Home Equipment: None      Prior Function Prior Level of Function : Independent/Modified Independent;Driving             Mobility Comments: no falls reported, moved in with daughter in the last 3 days ADLs Comments: still driving, completing all ADLs and IADLs independently, likes to garden     Hand Dominance   Dominant Hand: Right    Extremity/Trunk Assessment   Upper Extremity Assessment Upper Extremity Assessment: Generalized weakness    Lower Extremity Assessment Lower Extremity Assessment: Defer to PT evaluation    Cervical / Trunk Assessment Cervical / Trunk Assessment: Kyphotic (minimally)  Communication   Communication: No difficulties  Cognition Arousal/Alertness: Awake/alert Behavior During Therapy: WFL for tasks assessed/performed, Flat affect Overall Cognitive Status: Within Functional Limits for tasks assessed                                 General Comments: AxOx4        General Comments General comments (skin integrity, edema, etc.): BP assessed ar 188/74 (106) in sitting, HR as low as 31 seated EOB    Exercises     Assessment/Plan    PT Assessment Patient needs continued PT services  PT Problem List Decreased strength;Decreased activity tolerance;Decreased balance;Decreased mobility;Decreased knowledge of use of DME       PT Treatment Interventions Gait training;Stair training;Functional mobility training;Therapeutic activities;Therapeutic exercise;Patient/family education;DME instruction    PT Goals (Current goals can be found in the Care Plan section)  Acute Rehab PT Goals Patient Stated Goal: to go home PT Goal Formulation: With patient Time For Goal Achievement: 04/03/22 Potential to  Achieve Goals: Good    Frequency Min 3X/week     Co-evaluation               AM-PAC PT "6 Clicks" Mobility  Outcome Measure Help needed turning from your back to your side while in a flat bed without using bedrails?: None Help needed moving from lying on your back to sitting on the side of a flat bed without using bedrails?: None Help needed moving to and from a bed to a chair (including a wheelchair)?: A Little Help needed standing up from a chair using your arms (e.g., wheelchair or bedside chair)?: None Help needed to walk in hospital room?: A Little Help needed climbing 3-5 steps with a railing? : A Little 6 Click Score: 3    End of Session Equipment Utilized During Treatment: Gait belt Activity Tolerance: Patient tolerated treatment well Patient left: in bed;with call bell/phone within reach;with family/visitor present Nurse Communication: Mobility status;Other (comment) (elevated bp) PT Visit Diagnosis: Unsteadiness on feet (R26.81);Other abnormalities of gait and mobility (R26.89)    Time: [id number] PT Time Calculation (min) (ACUTE ONLY): 34 min   Charges:   PT Evaluation $PT Eval Moderate Complexity: 1 Mod PT Treatments $Gait Training: 8-22 mins        Havery Moros, MS, [place] Acute Rehabilitation Services Office: [place] 03/20/2022, 1:51 PM

## 2022-03-21 DIAGNOSIS — I5033 Acute on chronic diastolic (congestive) heart failure: Secondary | ICD-10-CM

## 2022-03-21 DIAGNOSIS — R001 Bradycardia, unspecified: Secondary | ICD-10-CM

## 2022-03-21 DIAGNOSIS — I4892 Unspecified atrial flutter: Secondary | ICD-10-CM | POA: Diagnosis not present

## 2022-03-21 DIAGNOSIS — I110 Hypertensive heart disease with heart failure: Secondary | ICD-10-CM | POA: Diagnosis not present

## 2022-03-21 DIAGNOSIS — I5021 Acute systolic (congestive) heart failure: Secondary | ICD-10-CM | POA: Diagnosis not present

## 2022-03-21 LAB — GLUCOSE, CAPILLARY
Glucose-Capillary: 105 mg/dL — ABNORMAL HIGH (ref 70–99)
Glucose-Capillary: 168 mg/dL — ABNORMAL HIGH (ref 70–99)
Glucose-Capillary: 128 mg/dL — ABNORMAL HIGH (ref 70–99)
Glucose-Capillary: 154 mg/dL — ABNORMAL HIGH (ref 70–99)

## 2022-03-21 LAB — BASIC METABOLIC PANEL
Anion gap: 8 (ref 5–15)
BUN: 29 mg/dL — ABNORMAL HIGH (ref 8–23)
CO2: 24 mmol/L (ref 22–32)
Calcium: 9.1 mg/dL (ref 8.9–10.3)
Chloride: 105 mmol/L (ref 98–111)
Creatinine, Ser: 1.73 mg/dL — ABNORMAL HIGH (ref 0.44–1.00)
GFR, Estimated: 28 mL/min — ABNORMAL LOW (ref >60)
Glucose, Bld: 121 mg/dL — ABNORMAL HIGH (ref 70–99)
Potassium: 3.9 mmol/L (ref 3.5–5.1)
Sodium: 137 mmol/L (ref 135–145)

## 2022-03-21 MED ORDER — HYDROCHLOROTHIAZIDE 25 MG PO TABS
25.0000 mg | ORAL_TABLET | Freq: Every day | ORAL | Status: DC
  Administered 2022-03-21 – 2022-03-22 (×2): 25 mg via ORAL
  Filled 2022-03-21 (×2): qty 1

## 2022-03-21 MED ORDER — HYDRALAZINE HCL 50 MG PO TABS
75.0000 mg | ORAL_TABLET | Freq: Three times a day (TID) | ORAL | Status: DC
  Administered 2022-03-21 – 2022-03-22 (×3): 75 mg via ORAL
  Filled 2022-03-21 (×4): qty 1

## 2022-03-21 MED ORDER — AMLODIPINE BESYLATE 10 MG PO TABS
10.0000 mg | ORAL_TABLET | Freq: Every day | ORAL | Status: DC
  Administered 2022-03-21 – 2022-03-22 (×2): 10 mg via ORAL
  Filled 2022-03-21 (×2): qty 1

## 2022-03-21 MED ORDER — METOPROLOL TARTRATE 12.5 MG HALF TABLET
12.5000 mg | ORAL_TABLET | Freq: Once | ORAL | Status: AC
  Administered 2022-03-21: 12.5 mg via ORAL
  Filled 2022-03-21: qty 1

## 2022-03-21 NOTE — Progress Notes (Signed)
Physical Therapy Treatment Patient Details Name: [name] MRN: [id number] DOB: 04-29-1931 Today's Date: 03/21/2022   History of Present Illness Patient is a 88 yo female admitted on 03/19/22 due to dizziness and SOB. Presenting with acute exacerbation of CHF and uncontrolled HTN. PMH includes paroxysmal Afib, CKD stage 3.    PT Comments    Pt received in supine, agreeable to therapy session with goal of stair negotiation and gait progression in hallway. Pt performed 11 steps with R rail and Supervision, given min cues for safety/activity pacing, no LOB. Pt able to perform greater than household distance gait task using RW and Supervision, cues for x2 standing breaks when HR >120 bpm, HR decreases after 1-2 mins standing break, RN aware of pt elevated BP/HR post-exertion. Pt continues to benefit from PT services to progress toward functional mobility goals.   Recommendations for follow up therapy are one component of a multi-disciplinary discharge planning process, led by the attending physician.  Recommendations may be updated based on patient status, additional functional criteria and insurance authorization.  Follow Up Recommendations  No PT follow up     Assistance Recommended at Discharge PRN  Patient can return home with the following A little help with walking and/or transfers;A little help with bathing/dressing/bathroom;Assistance with cooking/housework;Assist for transportation;Help with stairs or ramp for entrance   Equipment Recommendations  Rolling walker (2 wheels) (pending progress)    Recommendations for Other Services       Precautions / Restrictions Precautions Precautions: Fall;Other (comment) Precaution Comments: watch BP/HR Restrictions Weight Bearing Restrictions: No     Mobility  Bed Mobility Overal bed mobility: Modified Independent             General bed mobility comments: increased time, didn't use rails or need cues     Transfers Overall transfer level:  (Supervision) Equipment used: Rolling walker (2 wheels)    General transfer comment: stood from EOB, cues for safer hand placement standing to RW    Ambulation/Gait Ambulation/Gait assistance: Supervision Gait Distance (Feet): 400 Feet Assistive device: Rolling walker (2 wheels) Gait Pattern/deviations: Step-through pattern       General Gait Details: cues for improved proximity to RW and activity pacing, HR up to 125 bpm with exertion but improves with standing break   Stairs Stairs: Yes Stairs assistance: Supervision Stair Management: One rail Right, Step to pattern, Alternating pattern, Forwards Number of Stairs: 11 General stair comments: step-to pattern descending, reciprocal pattern ascending, cues needed for standing break prior to descending steps due to HR up to 125 bpm   Wheelchair Mobility    Modified Rankin (Stroke Patients Only)       Balance Overall balance assessment: Needs assistance Sitting-balance support: No upper extremity supported, Feet supported Sitting balance-Leahy Scale: Good     Standing balance support: No upper extremity supported, During functional activity Standing balance-Leahy Scale: Fair Standing balance comment: able to static stand unsupported but using RW for dynamic tasks due to recent hx of dizziness                            Cognition Arousal/Alertness: Awake/alert Behavior During Therapy: WFL for tasks assessed/performed, Flat affect Overall Cognitive Status: Within Functional Limits for tasks assessed                                 General Comments: AxOx4, pleasantly cooperative  Exercises      General Comments General comments (skin integrity, edema, etc.): BP elevated 183/95 (119) supine post-exertion; HR 83 bpm resting, up to 125 bpm during stair trial (decreased after 2 min standing break), SpO2 WFL on RA      Pertinent Vitals/Pain Pain  Assessment Pain Assessment: No/denies pain           PT Goals (current goals can now be found in the care plan section) Acute Rehab PT Goals Patient Stated Goal: to go home and take care of my plants PT Goal Formulation: With patient Time For Goal Achievement: 04/03/22 Progress towards PT goals: Progressing toward goals    Frequency    Min 3X/week      PT Plan Current plan remains appropriate       AM-PAC PT "6 Clicks" Mobility   Outcome Measure  Help needed turning from your back to your side while in a flat bed without using bedrails?: None Help needed moving from lying on your back to sitting on the side of a flat bed without using bedrails?: None Help needed moving to and from a bed to a chair (including a wheelchair)?: A Little Help needed standing up from a chair using your arms (e.g., wheelchair or bedside chair)?: A Little Help needed to walk in hospital room?: A Little Help needed climbing 3-5 steps with a railing? : A Little 6 Click Score: 20    End of Session Equipment Utilized During Treatment: Gait belt Activity Tolerance: Patient tolerated treatment well Patient left: in bed;with call bell/phone within reach;with family/visitor present (daughter present) Nurse Communication: Mobility status;Other (comment) (elevated BP/HR with activity) PT Visit Diagnosis: Unsteadiness on feet (R26.81);Other abnormalities of gait and mobility (R26.89)     Time: [id number] PT Time Calculation (min) (ACUTE ONLY): 25 min  Charges:  $Gait Training: 23-37 mins                     Stacy Sailer P., PTA Acute Rehabilitation Services Secure Chat Preferred 9a-5:30pm Office: [place] 03/21/2022, 4:51 PM

## 2022-03-21 NOTE — Progress Notes (Signed)
Subjective:  Overnight:Headache overnight with elevated BP at 1902/100s. Excedrin given after tylenol helped.   States doing well and denies any acute concerns. States dizziness and dyspnea have stayed resolved since yesterday am.   Objective:  Vital signs in last 24 hours: Hypertensive 170s-190s/90s-100s HR in 40s to 70s overnight. Afebrile, satting well on RA. Vitals:   03/20/22 2356 03/21/22 0010 03/21/22 0116 03/21/22 0310  BP: (!) 195/103  (!) 182/112 (!) 176/99  Pulse: 81 82 65 75  Resp: 20  (!) 21 17  Temp:    98.1 F (36.7 C)  TempSrc:    Oral  SpO2: 98%  91% 98%  Weight:    87.2 kg  Height:       Supplemental O2: Room Air SpO2: 98 % Filed Weights   03/19/22 2213 03/21/22 0310  Weight: 89.4 kg 87.2 kg    Intake/Output Summary (Last 24 hours) at 03/21/2022 0720 Last data filed at 03/21/2022 0600 Gross per 24 hour  Intake 240 ml  Output 1500 ml  Net -1260 ml    Net IO Since Admission: -1,860 mL [03/21/22 0720] Physical Exam  General: Elderly female in NAD laying in bed comfortably HENT: NCAT Lungs:  CTAB, no wheeze, no rales, no rhonchi Cardiovascular: Bradycardic, sinus rhythm, systolic murmur present, 2+ radial pulses, no LE edema Abdomen: No ttp, normal bowel sounds MSK: no asymmetry Skin: no lesions on exposed skin Neuro: alert and oriented x4 Psych: normal mood and normal affect  Diagnostics BMP shows normal electrolytes, creatinine elevated at 1.73, BUN at 24.   Assessment/Plan: [name] is a 88 y.o. with PMH of paroxysmal a. Fib, HFpEF, HTN presenting with dizziness and dyspnea found to have elevated blood pressures on admission, elevated BNP found to have CHF exacerbation.   Principal Problem:   Acute exacerbation of CHF (congestive heart failure) (HCC)  #HFpEF (EF 50-60% 7/22) exacerbation #Aortic stenosis status post TAVR #Hypertension #CAD Overall dyspnea and dizziness resolved. 1.5 L output recorded yesterday. She was  hypertensive overnight and metoprolol was given. Given her uncontrolled hypertension,  amlodipine 10 mg started started this am, and her HCTZ increased to 25 mg. Will discontinue her metoprolol due to concern for bradycardia. Given low gfr she may benefit from po lasix as part of her regimen. Other agents including SGLT2, BiDil can be considered in this patient. Will hold SGLT2 given gfr <30. Echo showed hypokinesis of the septal and basal inferior walls. LVEF at 55%, LA severely dilated. LV is normal in size. Moderate elevated PA systolic pressure. Mild MV and TV regurgitation, trivial pericardial effusion.  -Continue antihypertensives as above -Strict I's and O's -Daily weights -continue rosuvastatin 10 mg    #AKI on CKD stage IIIa Baseline creatinine around 1.3.  Creatinine was elevated at 1.73 from 1.28 on presentation but improved to 1.48 this am. Most likely secondary to decreased renal perfusion 2/2 to congestion. UA shows proteinuria and small leukocytes but no nitrites.  -Avoid nephrotoxic medications -Trend BMP -strict Is and Os  #Bradycardia #Atrial Flutter with intraventricular conduction block #Dizziness Has been intermittently bradycardic since yesterday. Suspect this may be secondary to her metoprolol use. EKG shows atrial flutter with intraventricular block. This may be causing her dizziness as well. Her orthostatics were negative. Vestibular evaluation is placed and pending. We will ambulate patient with tele to observe changes in the HR later today. If bradycardia persists without metoprolol then she may benefit for cardiology consultation. PT/OT recommend no follow up.  -Ambulate with tele -  CTM  #Paroxysmal atrial fibrillation #Left bundle branch block Initial EKG showed sinus rhythm with prolonged PR interval at 229. Repeat EKG showed atrial flutter with intra-ventricular block. Medications included metoprolol tartrate 25 mg twice daily and Eliquis 5 mg twice daily. The LBBB  was first seen on EKG in July 2022. Given the bradycardia (see above), we will discontinue metoprolol.  -CTM  #Type 2 diabetes mellitus HgbA1c of 7.2 on 03/16/22.  She does not take any medications for her diabetes. SGLT2 inhibitor will help with this along with her other co-morbidities. CBG this am 105. -Continue SSI -CTM  #Hypothyroidism TSH of 7 at PCP visit last week.  To early to repeat again. Home medications include levothyroxine 75 mcg. -Continue levothyroxine 75 mcg` -We will recommend repeating outpatient   #Glaucoma Patient is include Latanoprost drops. -continue home drops   Diet: Carbs/HH diet IVF: None VTE: Eliquis Code: DNR PT/OT recs: Pending Prior to Admission Living Arrangement:Home Anticipated Discharge Location: Home Barriers to Discharge: Medical workup Dispo: Anticipated discharge in approximately 1 day(s).   Idamae Schuller, MD [place]ie Rung. [facility] Internal Medicine Residency, PGY-1  Pager: [phone] After 5 pm and on weekends: Please call the on-call pager

## 2022-03-21 NOTE — Progress Notes (Signed)
Paged on call MD.... notified of persistent headache pt has had since beginning of shift.... 1000mg  of Acetaminophen given around 2200 .Marland Kitchen.. pain decreased from 7/10 to 5/10.... Pt requested more Acetaminophen for headache around 0000 and pain is now 7/10.... denies chest pain, blurred vision, dizziness, SOB  BP at 1922 - 173/60 BP at 2345 - 194/101  Requested medication for HTN  See MAR/orders from MD

## 2022-03-21 NOTE — Care Management Obs Status (Signed)
[place] NOTIFICATION   Patient Details  Name: [name] MRN: [id number] Date of Birth: 11/03/1930   Medicare Observation Status Notification Given:  Yes  Permission for verbal consent due to remote  Verdell Carmine, RN 03/21/2022, 4:44 PM

## 2022-03-21 NOTE — Progress Notes (Signed)
Occupational Therapy Treatment Patient Details Name: [name] MRN: [id number] DOB: 1930/11/23 Today's Date: 03/21/2022   History of present illness Patient is a 88 yo female admitted on 03/19/22 due to dizziness and SOB. Presenting with acute exacerbation of CHF and uncontrolled HTN. PMH includes paroxysmal a fib, CKD stage 3.   OT comments  Patient making good gains with OT treatment. Patient able to stand at sink for grooming and bathing tasks and sat only to bathe feet. Patient able to dress self with supervision. Patient performed mobility in hallway with RW with HR at 110. Acute OT to continue to follow.    Recommendations for follow up therapy are one component of a multi-disciplinary discharge planning process, led by the attending physician.  Recommendations may be updated based on patient status, additional functional criteria and insurance authorization.    Follow Up Recommendations  No OT follow up    Assistance Recommended at Discharge PRN  Patient can return home with the following  Assistance with cooking/housework;Assist for transportation;Help with stairs or ramp for entrance   Equipment Recommendations  None recommended by OT    Recommendations for Other Services      Precautions / Restrictions Precautions Precautions: Fall;Other (comment) Precaution Comments: watch bp Restrictions Weight Bearing Restrictions: No       Mobility Bed Mobility Overal bed mobility: Modified Independent             General bed mobility comments: increased time, didn't use rails or need cues    Transfers Overall transfer level: Modified independent Equipment used: Rolling walker (2 wheels)               General transfer comment: stood from EOB and chair without physical assistance     Balance Overall balance assessment: Needs assistance Sitting-balance support: No upper extremity supported, Feet supported Sitting balance-Leahy Scale: Good      Standing balance support: No upper extremity supported, During functional activity Standing balance-Leahy Scale: Good Standing balance comment: stood at sink for self care                           ADL either performed or assessed with clinical judgement   ADL Overall ADL's : Needs assistance/impaired     Grooming: Wash/dry hands;Wash/dry face;Oral care;Standing;Supervision/safety Grooming Details (indicate cue type and reason): at sink Upper Body Bathing: Supervision/ safety;Standing Upper Body Bathing Details (indicate cue type and reason): at sink Lower Body Bathing: Sit to/from stand;Supervison/ safety Lower Body Bathing Details (indicate cue type and reason): stood for peri area cleaning and bathed feet seated Upper Body Dressing : Set up;Sitting Upper Body Dressing Details (indicate cue type and reason): pajama top Lower Body Dressing: Sitting/lateral leans;Supervision/safety Lower Body Dressing Details (indicate cue type and reason): donn pajama bottom and socks               General ADL Comments: patient performed bathing standing at sink and sitting for bathing feet only.    Extremity/Trunk Assessment              Vision       Perception     Praxis      Cognition Arousal/Alertness: Awake/alert Behavior During Therapy: WFL for tasks assessed/performed, Flat affect Overall Cognitive Status: Within Functional Limits for tasks assessed  General Comments: AxOx4        Exercises      Shoulder Instructions       General Comments ambulated in hall with min guard to supervision for safety    Pertinent Vitals/ Pain       Pain Assessment Pain Assessment: No/denies pain  Home Living                                          Prior Functioning/Environment              Frequency  Min 2X/week        Progress Toward Goals  OT Goals(current goals can now be found in  the care plan section)  Progress towards OT goals: Progressing toward goals  Acute Rehab OT Goals Patient Stated Goal: go home OT Goal Formulation: With patient Time For Goal Achievement: 04/03/22 Potential to Achieve Goals: Good ADL Goals Pt Will Perform Lower Body Bathing: Independently Pt Will Perform Lower Body Dressing: Independently Pt Will Transfer to Toilet: ambulating Additional ADL Goal #1: Patient will demonstrate ability to complete functional task in standing for 3-5 minutes without need for seated rest break in order to promote independence with ADLs and IADLs.  Plan Discharge plan remains appropriate    Co-evaluation                 AM-PAC OT "6 Clicks" Daily Activity     Outcome Measure   Help from another person eating meals?: None Help from another person taking care of personal grooming?: None Help from another person toileting, which includes using toliet, bedpan, or urinal?: A Little Help from another person bathing (including washing, rinsing, drying)?: A Little Help from another person to put on and taking off regular upper body clothing?: A Little Help from another person to put on and taking off regular lower body clothing?: A Little 6 Click Score: 20    End of Session Equipment Utilized During Treatment: Rolling walker (2 wheels)  OT Visit Diagnosis: Muscle weakness (generalized) (M62.81)   Activity Tolerance Patient tolerated treatment well   Patient Left in chair;with call bell/phone within reach;with chair alarm set;with family/visitor present   Nurse Communication Mobility status        Time: [id number] OT Time Calculation (min): 34 min  Charges: OT General Charges $OT Visit: 1 Visit OT Treatments $Self Care/Home Management : 23-37 mins  Lodema Hong, [place]  Pager [phone] Office [place] 03/21/2022, 11:20 AM

## 2022-03-21 NOTE — Progress Notes (Signed)
Heart Failure Stewardship Pharmacist Progress Note   PCP: Mercie Eon, MD PCP-Cardiologist: Tonny Bollman, MD    HPI:  88 yo AAF with PMH of HTN, Afib on Eliquis, severe aortic stenosis s/p TAVR June 2020, T2DM, HLD, diastolic CHF, hyperthyroidism, and CKD IIIa who presented to [facility] ED on 05/27 with DOE and high blood pressure over the past two weeks.  She recently established care with [facility] on 05/26 where losartan-HCTXZ 100-12.5 mg daily was added to her existing regimen of Lopressor 25 mg BID.   She was diagnosed with diastolic CHF in 2012 with G2DD on Echo and developed moderate/severe aortic stenosis in 2014.  She underwent a R/LHC in June 2020 demonstrating mild nonobstructive CAD and severe aortic stenosis which was confirmed on 03/31/19 CT coronary with an aortic valve calcium score of 2316.  A successful TAVR on 04/22/19 was performed and repeat Echos showed normally functioning TAVR with preserved EF of 55-60%, G2DD and mild MR.  A stress test in 04/2021 was normal.    On presentation to the ED 05/28, she reported dizziness and continued dyspnea with LEE on exam.  CXR showed cardiomegaly and prominent central pulmonary vessels suggestive of mild CHF. An Echo revealed a LVEF of 50-55% which was mildly reduced from July 2022.  RV function was normal but pulmonary pressures were moderately elevated with mild MR and TR. She was given a dose of IV Lasix 40 mg x1 in the ED 05/28 with no diuretics since due to worsening renal function (SCr 1.28 > 1.48 > 1.73).  Home Lopressor 25 mg BID was held in response to persistent, asymptomatic bradycardia (HR 30-50s), PTA losartan-HCTZ was switched to HCTZ 25 mg monotherapy with SCr bump and her PTA amlodipine 10 mg daily was continued.  Current HF Medications: None  Prior to admission HF Medications: Beta blocker: Lopressor 25 mg BID ACE/ARB/ARNI: Losartan-HCTZ 100-12.5 mg daily  Pertinent Lab Values: Serum creatinine 1.73 (1.28 on admission), BUN 29,  Potassium 3.9, Sodium 137, BNP 420.9, Magnesium 1.9, A1c 7.2%  Vital Signs: Weight: 195 lbs (admission weight: 197 lbs) Discharge weight 05/27 in ED 201.5 lbs Blood pressure: 150-80/60-100  Heart rate: 60-70s  I/O: -1.7L yesterday; net -1.86 L  Medication Assistance / Insurance Benefits Check: Does the patient have prescription insurance?  Yes Type of insurance plan: UHC Medicare / Medicaid   Does the patient qualify for medication assistance through manufacturers or grants?   Pending Eligible grants and/or patient assistance programs: yes Medication assistance applications in progress: no  Medication assistance applications approved: no  Outpatient Pharmacy:  Prior to admission outpatient pharmacy: [facility] Is the patient willing to use [facility] TOC pharmacy at discharge? Yes Is the patient willing to transition their outpatient pharmacy to utilize a [facility] outpatient pharmacy?   Pending    Assessment: 1. Acute on chronic diastolic CHF (LVEF 50-55%%), due to uncontrolled HTN. NYHA class II symptoms. - No diuretics - down 5 lbs after single dose of IV Lasix 40 mg in the ED on 05/28.  - Holding beta-blocker with bradycardia in 30s during PT - improved today in 60-70s - No ACEi/ARB/ARNI, MRA, SGLT2i with AoC kidney injury   Plan: 1) Medication changes recommended at this time: - Stop PTA HCTZ with worsening renal function.  Limits ability to optimize GDMT - Eventually stop amlodipine when BP better controlled.  Likely contributing to LEE found on exam in the ED  - Start hydralazine 50 mg TID and Imdur 30 mg daily - low threshold  to increase to allow for amlodipine discontinuation and further GDMT optimization   2) Patient assistance: - none pending  3)  Education  - To be completed prior to discharge  Filbert Schilder, PharmD PGY1 Pharmacy Resident 03/21/2022  11:03 AM

## 2022-03-22 ENCOUNTER — Other Ambulatory Visit (HOSPITAL_COMMUNITY): Payer: Self-pay

## 2022-03-22 DIAGNOSIS — R001 Bradycardia, unspecified: Secondary | ICD-10-CM | POA: Diagnosis not present

## 2022-03-22 DIAGNOSIS — I110 Hypertensive heart disease with heart failure: Secondary | ICD-10-CM | POA: Diagnosis not present

## 2022-03-22 DIAGNOSIS — I5021 Acute systolic (congestive) heart failure: Secondary | ICD-10-CM | POA: Diagnosis not present

## 2022-03-22 DIAGNOSIS — I5033 Acute on chronic diastolic (congestive) heart failure: Secondary | ICD-10-CM | POA: Diagnosis not present

## 2022-03-22 DIAGNOSIS — I4892 Unspecified atrial flutter: Secondary | ICD-10-CM | POA: Diagnosis not present

## 2022-03-22 LAB — RENAL FUNCTION PANEL
Albumin: 3.3 g/dL — ABNORMAL LOW (ref 3.5–5.0)
Anion gap: 7 (ref 5–15)
BUN: 30 mg/dL — ABNORMAL HIGH (ref 8–23)
CO2: 24 mmol/L (ref 22–32)
Calcium: 9.2 mg/dL (ref 8.9–10.3)
Chloride: 105 mmol/L (ref 98–111)
Creatinine, Ser: 1.5 mg/dL — ABNORMAL HIGH (ref 0.44–1.00)
GFR, Estimated: 33 mL/min — ABNORMAL LOW (ref >60)
Glucose, Bld: 124 mg/dL — ABNORMAL HIGH (ref 70–99)
Phosphorus: 2.7 mg/dL (ref 2.5–4.6)
Potassium: 3.7 mmol/L (ref 3.5–5.1)
Sodium: 136 mmol/L (ref 135–145)

## 2022-03-22 LAB — MAGNESIUM: Magnesium: 2 mg/dL (ref 1.7–2.4)

## 2022-03-22 LAB — GLUCOSE, CAPILLARY
Glucose-Capillary: 123 mg/dL — ABNORMAL HIGH (ref 70–99)
Glucose-Capillary: 145 mg/dL — ABNORMAL HIGH (ref 70–99)

## 2022-03-22 MED ORDER — LEVOTHYROXINE SODIUM 100 MCG PO TABS
100.0000 ug | ORAL_TABLET | Freq: Every day | ORAL | 0 refills | Status: DC
  Filled 2022-03-22: qty 30, 30d supply, fill #0

## 2022-03-22 MED ORDER — HYDROCHLOROTHIAZIDE 25 MG PO TABS
25.0000 mg | ORAL_TABLET | Freq: Every day | ORAL | 0 refills | Status: DC
  Filled 2022-03-22: qty 30, 30d supply, fill #0

## 2022-03-22 MED ORDER — HYDRALAZINE HCL 100 MG PO TABS
100.0000 mg | ORAL_TABLET | Freq: Two times a day (BID) | ORAL | 0 refills | Status: DC
  Filled 2022-03-22: qty 60, 30d supply, fill #0

## 2022-03-22 MED ORDER — AMLODIPINE BESYLATE 10 MG PO TABS
10.0000 mg | ORAL_TABLET | Freq: Every day | ORAL | 0 refills | Status: DC
  Filled 2022-03-22: qty 30, 30d supply, fill #0

## 2022-03-22 NOTE — Progress Notes (Addendum)
Physical Therapy Treatment Patient Details Name: [name] MRN: [id number] DOB: September 07, 1931 Today's Date: 03/22/2022   History of Present Illness Patient is a 88 yo female admitted on 03/19/22 due to dizziness and SOB. Presenting with acute exacerbation of CHF and uncontrolled HTN. PMH includes paroxysmal Afib, CKD stage 3.    PT Comments    Pt received up ad lib, leaving bathroom and agreeable to therapy session. Pt supervision for gait without AD, noted monitor reading Vtach however pt asx and per RN monitor tech did not call so likely artifact/noisy signal? Pt able to progress gait distance in hallway with rollator for energy conservation and no acute s/sx distress. Pt would benefit from rollator upon DC for fall risk prevention and energy conservation and given handout to reinforce activity pacing/energy conservation tips reviewed during session. Pt continues to benefit from PT services to progress toward functional mobility goals. DME recs clarified below after discussion with patient and supervising PT Jaclyn R.   Recommendations for follow up therapy are one component of a multi-disciplinary discharge planning process, led by the attending physician.  Recommendations may be updated based on patient status, additional functional criteria and insurance authorization.  Follow Up Recommendations  No PT follow up     Assistance Recommended at Discharge PRN  Patient can return home with the following A little help with walking and/or transfers;A little help with bathing/dressing/bathroom;Assistance with cooking/housework;Assist for transportation;Help with stairs or ramp for entrance   Equipment Recommendations  Rollator (4 wheels) (if insurance covers it; needs for energy conservation/fall risk prevention)    Recommendations for Other Services       Precautions / Restrictions Precautions Precautions: Fall;Other (comment) Precaution Comments: watch BP/HR Restrictions Weight  Bearing Restrictions: No     Mobility  Bed Mobility Overal bed mobility: Modified Independent             General bed mobility comments: increased time, didn't use rails or need cues    Transfers Overall transfer level: Needs assistance Equipment used: Rollator (4 wheels) Transfers: Sit to/from Stand Sit to Stand: Supervision           General transfer comment: cues for use of brakes with rollator and safe hand placement for EOB<>rollator    Ambulation/Gait Ambulation/Gait assistance: Modified independent (Device/Increase time), Supervision Gait Distance (Feet): 300 Feet (4ft without AD and supervision, modI for hallway gait using rollator) Assistive device: Rollator (4 wheels) Gait Pattern/deviations: Step-through pattern       General Gait Details: cues for activity pacing, x2 brief standing breaks due to HR to 127 bpm with ambulation on flat surfaces. HR 80's bpm resting.   Stairs         General stair comments: pt defer, pt for DC today does not want to be too fatigued and has no concerns from previous date performance   Wheelchair Mobility    Modified Rankin (Stroke Patients Only)       Balance Overall balance assessment: Needs assistance Sitting-balance support: No upper extremity supported, Feet supported Sitting balance-Leahy Scale: Good     Standing balance support: No upper extremity supported, During functional activity Standing balance-Leahy Scale: Fair Standing balance comment: able to static stand unsupported and ambulate in room short distances but agreeable to use 4WW in hallway for energy conservation                            Cognition Arousal/Alertness: Awake/alert Behavior During Therapy: [facility] for tasks  assessed/performed Overall Cognitive Status: Within Functional Limits for tasks assessed                                 General Comments: AxOx4, pleasantly cooperative        Exercises Other  Exercises Other Exercises: gait billed as TE for BLE strengthening, not needing safety cues when using rollator    General Comments General comments (skin integrity, edema, etc.): BP 140/62 prior to OOB and denies dizziness or headache with gait; HR tachy intermittently with activity see gait comments      Pertinent Vitals/Pain Pain Assessment Pain Assessment: No/denies pain           PT Goals (current goals can now be found in the care plan section) Acute Rehab PT Goals Patient Stated Goal: to go home and take care of my plants PT Goal Formulation: With patient Time For Goal Achievement: 04/03/22 Progress towards PT goals: Progressing toward goals    Frequency    Min 3X/week      PT Plan Current plan remains appropriate       AM-PAC PT "6 Clicks" Mobility   Outcome Measure  Help needed turning from your back to your side while in a flat bed without using bedrails?: None Help needed moving from lying on your back to sitting on the side of a flat bed without using bedrails?: None Help needed moving to and from a bed to a chair (including a wheelchair)?: None Help needed standing up from a chair using your arms (e.g., wheelchair or bedside chair)?: A Little Help needed to walk in hospital room?: A Little (supervision without AD, modi with AD) Help needed climbing 3-5 steps with a railing? : A Little 6 Click Score: 21    End of Session Equipment Utilized During Treatment: Gait belt Activity Tolerance: Patient tolerated treatment well Patient left: in bed;with call bell/phone within reach;with family/visitor present (sitting EOB, RPh entering room to speak with pt) Nurse Communication: Mobility status;Other (comment) (monitor reading vtach while pt in bathroom but pt denies sx) PT Visit Diagnosis: Unsteadiness on feet (R26.81);Other abnormalities of gait and mobility (R26.89)     Time: [id number] PT Time Calculation (min) (ACUTE ONLY): 15 min  Charges:  $Therapeutic  Exercise: 8-22 mins                     Jeorgia Helming P., PTA Acute Rehabilitation Services Secure Chat Preferred 9a-5:30pm Office: [place] 03/22/2022, 12:19 PM

## 2022-03-22 NOTE — Discharge Summary (Signed)
Name: [name] MRN: [id number] DOB: July 10, 1931 88 y.o. PCP: Lottie Mussel, MD  Date of Admission: 03/19/2022  4:04 PM Date of Discharge: 03/22/22 Attending Physician: Lottie Mussel, MD  Discharge Diagnosis: Principal Problem:   Acute exacerbation of CHF (congestive heart failure) ([place]) Active Problems:   Uncontrolled hypertension   Bradycardia   Atrial flutter [facility])   Discharge Medications: Allergies as of 03/22/2022       Reactions   Lisinopril    Angioneurotic edema     Med Rec must be completed prior to using this [facility]***       Disposition and follow-up:   JaneAnnalia J Hebert was discharged from [facility] in Stable condition.  At the hospital follow up visit please address:  HTN: Ensure blood pressure is well controlled and adjust regimen as needed.  Bradycardia: Ensure patient's HR is within normal limits and she is not having symptoms of bradycardia including fatigue, dizziness, or syncopal or pre-syncopal events.   2.  Labs / imaging needed at time of follow-up: BMP  3.  Pending labs/ test needing follow-up: None  Follow-up Appointments:   Hospital Course by problem list: HPI per Dr. Howie Ill "[name] is a 88 y.o. female with a pertinent PMH of paroxysmal afib on eliquis, hypertension, aortic stenosis s/p TAVR, HFpEF (EF 55 to 60% 7/22), CKD stage 3a, and diabetes mellitus, who presents to [facility] with dizziness and shortness of breath for 2 weeks.   Ms. Chin reports dizziness and dyspnea on exertion for several weeks.  She has had elevated blood pressure throughout this time as well.  She experiences dizziness when she gets up for the past 2 weeks prompting her to visit her PCP [facility]) on 5/25 to establish care.  Her blood pressure was elevated in the 180s over 100s during that visit and her blood pressure medications were changed from losartan 50 mg to include losartan-HCTZ 100-12.5 mg.  She took her blood pressure  medications this morning.  She typically lives by herself and is very independent, however after returning home from PCP last week she did not feel safe at home and has been staying with her daughter since then.  She was evaluated in the emergency department on 5/27 due to dizziness and elevated blood pressure.  She was given 20 IV Lasix with improvement in blood pressure and discharged home.  EMS was called today because blood pressure was systolic of [id number] when her granddaughter checked it earlier.   She endorses a cough that has been present for the last 2 weeks that is occasionally productive.  She has dyspnea with exertion for the same duration, denies chest pain.  She has been eating and drinking normally.   Denies fevers, chills, headache, nausea, vomiting, and dysuria.  She does have intermittent constipation-last bowel movement today.  Endorses upper back pain that has been chronic.  Denies falling secondary to dizziness.   In the ED, the patient was hypertensive to 190/117. Lab evaluation showed BNP elevated at 420, creatinine elevated from 1.48 to 1.73 from yesterday, CBC within normal limits."   #HFpEF (EF 50-60% 7/22) exacerbation #Aortic stenosis status post TAVR #Hypertension #CAD Overall dyspnea and dizziness resolved.  Given her uncontrolled hypertension,  amlodipine 10 mg started started this am, and her HCTZ increased to 25 mg. Will discontinue her metoprolol due to concern for bradycardia (see below). Given low gfr she may benefit from po lasix as part of her regimen. Other agents including SGLT2, BiDil can  be considered in this patient. Will hold SGLT2 given gfr <30. Echo showed hypokinesis of the septal and basal inferior walls. LVEF at 55%, LA severely dilated. LV is normal in size. Moderate elevated PA systolic pressure. Mild MV and TV regurgitation, trivial pericardial effusion.      #AKI on CKD stage IIIa Baseline creatinine around 1.3.  Creatinine was elevated at 1.73 but  improved and was 1.5 on discharge. Most likely secondary to decreased renal perfusion 2/2 to congestion as it improved with lasix. UA shows proteinuria and small leukocytes but no nitrites.     #Bradycardia #Atrial Flutter with intraventricular conduction block #Dizziness Patient was intermittently bradycardic since admission. Suspect this may be secondary to her metoprolol use. EKG shows atrial flutter with intraventricular block. This may be causing her dizziness as well. Her orthostatics were negative. We ambulated patient with tele to observe changes in the HR and it appropriately increased with exertion. PT/OT recommend no follow up.      #Type 2 diabetes mellitus HgbA1c of 7.2 on 03/16/22.  She does not take any medications for her diabetes. SGLT2 inhibitor will help with this along with her other co-morbidities. CBG this am 105. SSI was continued.    #Hypothyroidism TSH of 7 at PCP visit last week.  To early to repeat again. Home medications include levothyroxine 75 mcg. Which was continued. Recommend repeating outpatient.   #Glaucoma Patient home med included Latanoprost drops which was continued.       Discharge Subjective:  Discharge Exam:   BP (!) 166/94 (BP Location: Right Arm)   Pulse 89   Temp (!) 97.3 F (36.3 C) (Oral)   Resp 20   Ht 5\' 4"  (1.626 m)   Wt 90.2 kg   LMP  (LMP Unknown)   SpO2 98%   BMI 34.13 kg/m  Physical Exam: ***   Pertinent Labs, Studies, and Procedures:     Latest Ref Rng & Units 03/20/2022    1:39 AM 03/19/2022    4:16 PM 03/18/2022    3:37 PM  CBC  WBC 4.0 - 10.5 K/uL 5.7   5.6   5.6    Hemoglobin 12.0 - 15.0 g/dL 14.4   14.5   13.7    Hematocrit 36.0 - 46.0 % 43.8   44.8   42.0    Platelets 150 - 400 K/uL 193   204   201         Latest Ref Rng & Units 03/22/2022    1:26 AM 03/21/2022    2:18 AM 03/20/2022    1:39 AM  CMP  Glucose 70 - 99 mg/dL 124   121   135    BUN 8 - 23 mg/dL 30   29   24     Creatinine 0.44 - 1.00 mg/dL 1.50    1.73   1.56    Sodium 135 - 145 mmol/L 136   137   140    Potassium 3.5 - 5.1 mmol/L 3.7   3.9   4.0    Chloride 98 - 111 mmol/L 105   105   105    CO2 22 - 32 mmol/L 24   24   23     Calcium 8.9 - 10.3 mg/dL 9.2   9.1   9.8      DG Chest 2 View  Result Date: 03/19/2022 CLINICAL DATA:  Shortness of breath. EXAM: CHEST - 2 VIEW COMPARISON:  AP chest 03/18/2022 chest two views 04/18/2019  IMPRESSION: 1. Markedly  enlarged cardiac silhouette. 2. Left lower lobe heterogeneous airspace opacification, possible atelectasis versus pneumonia. Electronically Signed   By: Yvonne Kendall M.D.   On: 03/19/2022 16:58   ECHOCARDIOGRAM COMPLETE  Result Date: 03/20/2022    ECHOCARDIOGRAM REPORT   Patient Name:   IZABELL KOSTY Date of Exam: 03/20/2022 IMPRESSIONS  1. DIfficult aptical window limit study OVerall LVEF may be mildlly down from previous study.  2. Hypokinesis of the septal and Basal inferior walls. . Left ventricular ejection fraction, by estimation, is 50 to 55%. The left ventricle has low normal function. Left ventricular diastolic parameters are indeterminate.  3. Right ventricular systolic function is normal. The right ventricular size is normal. There is moderately elevated pulmonary artery systolic pressure.  4. Left atrial size was severely dilated.  5. MR is posteriorly directed. . Mild mitral valve regurgitation.  6. Tricuspid valve regurgitation is mild to moderate.  7. S/p TAVR (23 mm Edwards Sapien 3, procedure date 03/26/19). Peak and mean gradients through the valve are 12 and 6 mm Hg respectively. Trivial perivalvular leak (around 3OClock) present COmpared to echo from 05/09/21, mean gradient is decreased (13 to 6  mm Hg). The aortic valve has been repaired/replaced. Aortic valve regurgitation is trivial. There is a valve present in the aortic position.  8. The inferior vena cava is normal in size with greater than 50% respiratory variability, suggesting right atrial pressure of 3 mmHg.    Discharge Instructions:   Signed: Idamae Schuller, MD Tillie Rung. [facility] Internal Medicine Residency, PGY-1  03/22/2022, 7:16 AM   Pager: [phone]

## 2022-03-22 NOTE — Progress Notes (Incomplete)
Subjective:  Overnight: NAEON  Pt is seen at bedside during rounds today. Daughter is at bedside. She reports feeling well. No complaints at this time. She is updated on the plan for today. Questions and concerns are addressed.    Objective:  Vital signs in last 24 hours: Hypertensive 130s-150s/70s HR in 70s to 90s overnight. Afebrile, satting well on RA. Vitals:   03/21/22 1631 03/21/22 1933 03/21/22 2323 03/22/22 0324  BP: (!) 183/95 133/74 (!) 162/70 (!) 155/71  Pulse: 80 97 81 80  Resp: 20 18 16 20   Temp: (!) 97.2 F (36.2 C) (!) 97.3 F (36.3 C) 97.9 F (36.6 C) 97.7 F (36.5 C)  TempSrc: Oral Oral Oral Oral  SpO2: 98% 97% 96% 99%  Weight:      Height:       Supplemental O2: Room Air SpO2: 99 % Filed Weights   03/19/22 2213 03/21/22 0310  Weight: 89.4 kg 87.2 kg   No intake or output data in the 24 hours ending 03/22/22 [id number]  Net IO Since Admission: -1,860 mL [03/22/22 0613] Physical Exam *** General: Elderly female in NAD laying in bed comfortably HENT: NCAT Lungs:  CTAB, no wheeze, no rales, no rhonchi Cardiovascular: Bradycardic, sinus rhythm, systolic murmur present, 2+ radial pulses, no LE edema Abdomen: No ttp, normal bowel sounds MSK: no asymmetry Skin: no lesions on exposed skin Neuro: alert and oriented x4 Psych: normal mood and normal affect  Diagnostics BMP shows normal electrolytes, creatinine elevated at 1.50, BUN at 30.   Assessment/Plan: [name] is a 88 y.o. with PMH of paroxysmal a. Fib, HFpEF, HTN presenting with dizziness and dyspnea found to have elevated blood pressures on admission, elevated BNP found to have CHF exacerbation.   Principal Problem:   Acute exacerbation of CHF (congestive heart failure) (H[facility]) Active Problems:   Uncontrolled hypertension   Bradycardia   Atrial flutter ([facility])  #HFpEF (EF 50-60% 7/22) exacerbation #Aortic stenosis status post TAVR #Hypertension #CAD Overall dyspnea and dizziness  resolved.  She was hypertensive overnight and metoprolol was given. Given her uncontrolled hypertension,  amlodipine 10 mg started started this am, and her HCTZ increased to 25 mg. Will discontinue her metoprolol due to concern for bradycardia. Given low gfr she may benefit from po lasix as part of her regimen. Other agents including SGLT2, BiDil can be considered in this patient. Will hold SGLT2 given gfr <30. Echo showed hypokinesis of the septal and basal inferior walls. LVEF at 55%, LA severely dilated. LV is normal in size. Moderate elevated PA systolic pressure. Mild MV and TV regurgitation, trivial pericardial effusion.  -Continue antihypertensives as above -Strict I's and O's -Daily weights -continue rosuvastatin 10 mg    #AKI on CKD stage IIIa Improving. Baseline creatinine around 1.3 and creatinine 1.5 this am.  UA shows proteinuria and small leukocytes but no nitrites.  -Avoid nephrotoxic medications -Trend BMP -strict Is and Os  #Bradycardia #Atrial Flutter with intraventricular conduction block #Dizziness Has been intermittently bradycardic since yesterday. Suspect this may be secondary to her metoprolol use. EKG shows atrial flutter with intraventricular block. This may be causing her dizziness as well. Her orthostatics were negative. Vestibular evaluation is placed and pending. We will ambulate patient with tele to observe changes in the HR later today. If bradycardia persists without metoprolol then she may benefit for cardiology consultation. PT/OT recommend no follow up.  -Ambulate with tele -CTM  #Type 2 diabetes mellitus HgbA1c of 7.2 on 03/16/22.  She does not  take any medications for her diabetes. SGLT2 inhibitor will help with this along with her other co-morbidities. CBG this am 105. -Continue SSI -CTM  #Hypothyroidism TSH of 7 at PCP visit last week.  To early to repeat again. Home medications include levothyroxine 75 mcg. -Continue levothyroxine 75 mcg` -We will  recommend repeating outpatient   #Glaucoma Patient is include Latanoprost drops. -continue home drops   Diet: Carbs/HH diet IVF: None VTE: Eliquis Code: DNR PT/OT recs: Pending Prior to Admission Living Arrangement:Home Anticipated Discharge Location: Home Barriers to Discharge: Medical workup Dispo: Anticipated discharge in approximately 1 day(s).   Idamae Schuller, MD Tillie Rung. [facility] Internal Medicine Residency, PGY-1  Pager: [phone] After 5 pm and on weekends: Please call the on-call pager

## 2022-03-22 NOTE — Discharge Instructions (Signed)
[name],   You presented to the ED with shortness of breath and dizziness. You had significantly elevated blood pressure and CHF exacerbation. We gave you IV lasix for the extra fluids and gave changed your medicines for the high blood pressure. Your dizziness and shortness of breath improved. Your blood pressure improved as well.   Your heart rate was lower while you were in the hospital and we stopped your Metoprolol which resolved the issue. We want to follow up with you in the clinic and work on your blood pressure more. We will schedule a hospital follow up for next week. Please follow up and bring a log of blood pressures and bring your blood pressure monitor so we make the appropriate changes.   Some medication changes are listed below: Please stop taking: Losartan-HCTZ Metoprolol  Please start taking: Amlodipine 10 mg daily HCTZ 25 mg daily Hydralazine 100 mg twice daily

## 2022-03-22 NOTE — Progress Notes (Signed)
Heart Failure Stewardship Pharmacist Progress Note   PCP: Mercie Eon, MD PCP-Cardiologist: Tonny Bollman, MD    HPI:  88 yo AAF with PMH of HTN, Afib on Eliquis, severe aortic stenosis s/p TAVR June 2020, T2DM, HLD, diastolic CHF, hyperthyroidism, and CKD IIIa who presented to [facility] ED on 05/27 with DOE and high blood pressure over the past two weeks.  She recently established care with [facility] on 05/26 where losartan-HCTXZ 100-12.5 mg daily was added to her existing regimen of Lopressor 25 mg BID.   She was diagnosed with diastolic CHF in 2012 with G2DD on Echo and developed moderate/severe aortic stenosis in 2014.  She underwent a R/LHC in June 2020 demonstrating mild nonobstructive CAD and severe aortic stenosis which was confirmed on 03/31/19 CT coronary with an aortic valve calcium score of 2316.  A successful TAVR on 04/22/19 was performed and repeat Echos showed normally functioning TAVR with preserved EF of 55-60%, G2DD and mild MR.  A stress test in 04/2021 was normal.    On presentation to the ED 05/28, she reported dizziness and continued dyspnea with LEE on exam.  CXR showed cardiomegaly and prominent central pulmonary vessels suggestive of mild CHF. An Echo revealed a LVEF of 50-55% which was mildly reduced from July 2022.  RV function was normal but pulmonary pressures were moderately elevated with mild MR and TR.  She was given a dose of IV Lasix 40 mg x1 in the ED 05/28 with no diuretics since due to worsening renal function (SCr 1.28 > 1.48 > 1.73).  Home Lopressor 25 mg BID was held in response to persistent, asymptomatic bradycardia (HR 30-50s), PTA losartan-HCTZ was switched to HCTZ 25 mg monotherapy with SCr bump and her PTA amlodipine 10 mg daily was continued.  Current HF Medications: Other: hydralazine 75 mg q8h  Prior to admission HF Medications: Beta blocker: Lopressor 25 mg BID ACE/ARB/ARNI: Losartan-HCTZ 100-12.5 mg daily  Pertinent Lab Values: Serum creatinine 1.73 >  1.5 (1.28 on admission), BUN 30, Potassium 3.7, Sodium 136, BNP 420.9, Magnesium 2.0, A1c 7.2%  Vital Signs: Weight: 198 lbs (admission weight: 197 lbs) Discharge weight 05/27 in ED 201.5 lbs Blood pressure: 150-80/60-100  Heart rate: 60-70s  I/O: -300 mL yesterday; net -1.62 L  Medication Assistance / Insurance Benefits Check: Does the patient have prescription insurance?  Yes Type of insurance plan: UHC Medicare / Medicaid   Does the patient qualify for medication assistance through manufacturers or grants?   Pending Eligible grants and/or patient assistance programs: yes Medication assistance applications in progress: no  Medication assistance applications approved: no  Outpatient Pharmacy:  Prior to admission outpatient pharmacy: [facility] Is the patient willing to use [facility] TOC pharmacy at discharge? Yes Is the patient willing to transition their outpatient pharmacy to utilize a [facility] outpatient pharmacy?   No    Assessment: 1. Acute on chronic diastolic CHF (LVEF 50-55%%), due to uncontrolled HTN. NYHA class II symptoms. - No diuretics - down 5 lbs after single dose of IV Lasix 40 mg in the ED on 05/28.  - Holding beta-blocker with bradycardia in 30s during PT - improved today in 60-70s - No ACEi/ARB/ARNI, MRA, SGLT2i with AoC kidney injury - Continue hydralazine 75 mg TID   Plan: 1) Medication changes recommended at this time: - Give potassium 20 mEq x1 to maintain K > 4 - Stop PTA HCTZ and ideally transition to losartan 25 mg daily.  Spoke with Internal Medicine team who intends to do this  at discharge. - Eventually stop amlodipine when BP better controlled.  Likely contributing to LEE found on exam in the ED   2) Patient assistance: - none pending  3)  Education  - Patient has been educated on current HF medications and potential additions to HF medication regimen - Patient verbalizes understanding that over the next few months, these medication doses may  change and more medications may be added to optimize HF regimen - Patient has been educated on basic disease state pathophysiology and goals of therapy  Filbert Schilder, PharmD PGY1 Pharmacy Resident 03/22/2022  7:28 AM

## 2022-03-22 NOTE — TOC Transition Note (Signed)
Transition of Care [facility]) - CM/SW Discharge Note   Patient Details  Name: [name] MRN: [id number] Date of Birth: 08-13-1931  Transition of [facility]) CM/SW Contact:  Epifanio Lesches, RN Phone Number: 03/22/2022, 12:54 PM   Clinical Narrative:    Patient will DC to: home Anticipated DC date: 03/22/2022 Family notified: yes, daughter Transport by: car  Present with acute  CHF exacerbation and uncontrolled HTN. Hx of paroxysmal Afib, CKD. From home alone. Supportive daughter, [name] [phone]).   Per MD patient ready for DC today. RN, patient, patient's family, and facility notified of DC.   Referral made with [employer]  for RW with seat. Equipment will be delivered to bedsideprior to d/c.  Post hospital f/u  noted on AVS. Daughter to provide transportation to to home. TOC Pharmacy will deliver Rx meds to bedside prior to d/c.  RNCM will sign off for now as intervention is no longer needed. Please consult [place] again if new needs arise.    Final next level of care: Home/Self Care Barriers to Discharge: No Barriers Identified   Patient Goals and CMS Choice     Choice offered to / list presented to : Patient  Discharge Placement                       Discharge Plan and Services   Discharge Planning Services: CM Consult            DME Arranged: Walker rolling with seat DME Agency: [employer] Date DME Agency Contacted: 03/22/22 Time DME Agency Contacted: [id number] Representative spoke with at DME Agency: Beola Cord            Social Determinants of Health (SDOH) Interventions     Readmission Risk Interventions     View : No data to display.

## 2022-03-22 NOTE — Progress Notes (Signed)
Internal Medicine Clinic Attending ? ?Case discussed with Dr. Zinoviev  At the time of the visit.  We reviewed the resident?s history and exam and pertinent patient test results.  I agree with the assessment, diagnosis, and plan of care documented in the resident?s note.  ?

## 2022-03-22 NOTE — Plan of Care (Signed)
DISCHARGE NOTE HOME CHLOE MIYOSHI to be discharged home per MD order. Discussed prescriptions and follow up appointments with the patient. Prescriptions given to patient; medication list explained in detail. Patient verbalized understanding.  Skin clean, dry and intact without evidence of skin break down, no evidence of skin tears noted. IV catheter discontinued intact. Site without signs and symptoms of complications. Dressing and pressure applied. Pt denies pain at the site currently. No complaints noted.  Patient free of lines, drains, and wounds.   An After Visit Summary (AVS) was printed and given to the patient. Patient escorted via wheelchair, and discharged home via private auto.  Arlice Colt, RN

## 2022-03-22 NOTE — Progress Notes (Signed)
Heart Failure Navigator Progress Note  Assessed for Heart & Vascular TOC clinic readiness.  Patient has a hospital cardiology follow up with Scott weaver on 04/05/22.      Rhae Hammock, BSN, Scientist, clinical (histocompatibility and immunogenetics) Only

## 2022-03-24 LAB — CULTURE, BLOOD (ROUTINE X 2)
Culture: NO GROWTH
Culture: NO GROWTH
Special Requests: ADEQUATE
Special Requests: ADEQUATE

## 2022-03-29 ENCOUNTER — Other Ambulatory Visit: Payer: Self-pay

## 2022-03-29 ENCOUNTER — Ambulatory Visit (INDEPENDENT_AMBULATORY_CARE_PROVIDER_SITE_OTHER): Payer: Medicare Other | Admitting: Student

## 2022-03-29 ENCOUNTER — Encounter: Payer: Self-pay | Admitting: Student

## 2022-03-29 VITALS — BP 133/73 | HR 95 | Temp 98.2°F | Resp 28 | Ht 64.0 in | Wt 192.5 lb

## 2022-03-29 DIAGNOSIS — E0590 Thyrotoxicosis, unspecified without thyrotoxic crisis or storm: Secondary | ICD-10-CM

## 2022-03-29 DIAGNOSIS — I110 Hypertensive heart disease with heart failure: Secondary | ICD-10-CM | POA: Diagnosis not present

## 2022-03-29 DIAGNOSIS — E039 Hypothyroidism, unspecified: Secondary | ICD-10-CM | POA: Diagnosis not present

## 2022-03-29 DIAGNOSIS — E119 Type 2 diabetes mellitus without complications: Secondary | ICD-10-CM | POA: Diagnosis not present

## 2022-03-29 DIAGNOSIS — I5033 Acute on chronic diastolic (congestive) heart failure: Secondary | ICD-10-CM

## 2022-03-29 DIAGNOSIS — I4819 Other persistent atrial fibrillation: Secondary | ICD-10-CM

## 2022-03-29 DIAGNOSIS — I10 Essential (primary) hypertension: Secondary | ICD-10-CM

## 2022-03-29 DIAGNOSIS — R001 Bradycardia, unspecified: Secondary | ICD-10-CM

## 2022-03-29 LAB — POCT GLYCOSYLATED HEMOGLOBIN (HGB A1C): Hemoglobin A1C: 6.2 % — AB (ref 4.0–5.6)

## 2022-03-29 LAB — GLUCOSE, CAPILLARY: Glucose-Capillary: 249 mg/dL — ABNORMAL HIGH (ref 70–99)

## 2022-03-29 MED ORDER — AMLODIPINE BESYLATE 10 MG PO TABS: 10.0000 mg | ORAL_TABLET | Freq: Every day | ORAL | 2 refills | Status: DC

## 2022-03-29 NOTE — Patient Instructions (Signed)
We will check your potassium and your kidney function, if these is at an appropriate level we will transition you off of the HYDRALAZINE medication and start you back on losartan. Expect a phone call from me regarding this.

## 2022-03-30 ENCOUNTER — Telehealth: Payer: Self-pay

## 2022-03-30 ENCOUNTER — Encounter: Payer: Medicare Other | Admitting: Internal Medicine

## 2022-03-30 LAB — BMP8+ANION GAP
Anion Gap: 18 mmol/L (ref 10.0–18.0)
BUN/Creatinine Ratio: 21 (ref 12–28)
BUN: 24 mg/dL (ref 8–27)
CO2: 21 mmol/L (ref 20–29)
Calcium: 9.9 mg/dL (ref 8.7–10.3)
Chloride: 96 mmol/L (ref 96–106)
Creatinine, Ser: 1.13 mg/dL — ABNORMAL HIGH (ref 0.57–1.00)
Glucose: 198 mg/dL — ABNORMAL HIGH (ref 70–99)
Potassium: 3.9 mmol/L (ref 3.5–5.2)
Sodium: 135 mmol/L (ref 134–144)
eGFR: 47 mL/min/{1.73_m2} — ABNORMAL LOW (ref >59)

## 2022-03-30 MED ORDER — LOSARTAN POTASSIUM 50 MG PO TABS: 50.0000 mg | ORAL_TABLET | Freq: Every day | ORAL | 3 refills | Status: DC

## 2022-03-30 NOTE — Assessment & Plan Note (Signed)
Patient appears euvolemic on exam.

## 2022-03-30 NOTE — Assessment & Plan Note (Signed)
>>  ASSESSMENT AND PLAN FOR BRADYCARDIA WRITTEN ON 03/30/2022  7:41 AM BY Marolyn Haller, MD  Patient HR WNL and regular. She remains off of beta blocker due to bradycardia during recent hospitalization.

## 2022-03-30 NOTE — Addendum Note (Signed)
Addended by: Michelle Piper on: 03/30/2022 04:17 PM   Modules accepted: Orders

## 2022-03-30 NOTE — Progress Notes (Signed)
Internal Medicine Clinic Attending  Case discussed with Dr. Carter  At the time of the visit.  We reviewed the resident's history and exam and pertinent patient test results.  I agree with the assessment, diagnosis, and plan of care documented in the resident's note.  
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
## 2022-03-30 NOTE — Assessment & Plan Note (Addendum)
Patient's blood pressure better controlled this clinic visit. She is currently on hydralazine 100mg  BID, amlodipine 10mg , and HCTZ 25mg . Her ARB was held due to acute on chronic kidney injury during her recent hospitalization. Patient denies any dyspnea or dizziness with this current antihypertensive regimen.   Today's Vitals   03/29/22 0831  BP: 133/73  Pulse: 95  Resp: (!) 28  Temp: 98.2 F (36.8 C)  TempSrc: Oral  SpO2: 100%  Weight: 192 lb 8 oz (87.3 kg)  Height: 5\' 4"  (1.626 m)   Body mass index is 33.04 kg/m.  A bmp was obtained this clinic visit, however the results are still pending. If patient's potassium is in a normal range and her serum creatinine has returned to her baseline (GFR is stable), plan is to discontinue her hydralazine and resume losartan.  -Discussed this with the patient and her daughter who accompanied her, will call both patient and her daughter once BMP results.   Addendum: Patient's BMP     Latest Ref Rng & Units 03/29/2022    9:16 AM 03/22/2022    1:26 AM 03/21/2022    2:18 AM  BMP  Glucose 70 - 99 mg/dL  05/29/2022  03/24/2022   BUN 8 - 27 mg/dL 24  30  29    Creatinine 0.57 - 1.00 mg/dL 03/23/2022  035  009   BUN/Creat Ratio 12 - 28 21     Sodium 134 - 144 mmol/L 135  136  137   Potassium 3.5 - 5.2 mmol/L 3.9  3.7  3.9   Chloride 96 - 106 mmol/L 96  105  105   CO2 20 - 29 mmol/L 21  24  24    Calcium 8.7 - 10.3 mg/dL 9.9  9.2  9.1    Serum creatinine is back to baseline, called patient and discontinuing hydralazine and resuming losartan 50mg . She manages her medications herself and places them in a pill box for the week. She is not sure which medication is her hydralazine and feels that she may end up taking her hydralazine and losartan. Discussed with patient that we will hold off on starting losartan until she comes in for her appointment 6/21.  Discussed with [facility] to not dispense the medication until 2 weeks from now when patient has her  appointment in our clinic.

## 2022-03-30 NOTE — Assessment & Plan Note (Signed)
>>  ASSESSMENT AND PLAN FOR (HFPEF) HEART FAILURE WITH PRESERVED EJECTION FRACTION (HCC) WRITTEN ON 03/30/2022  7:40 AM BY Marolyn Haller, MD  Patient appears euvolemic on exam.

## 2022-03-30 NOTE — Assessment & Plan Note (Signed)
Patient HR WNL and regular. She remains off of beta blocker due to bradycardia during recent hospitalization.

## 2022-03-30 NOTE — Telephone Encounter (Signed)
Patient requesting a call back regarding lab results.

## 2022-03-30 NOTE — Assessment & Plan Note (Signed)
Regular rate and rhythm this clinic visit. Remains off of AV nodal blocking agent due to bradycardia in the hospital.  -Continue eliquis

## 2022-03-30 NOTE — Assessment & Plan Note (Signed)
TFTs not checked this clinic visit. Remains too early. She continues on levothyroxine daily.

## 2022-03-30 NOTE — Assessment & Plan Note (Signed)
Patient is currently not on any antidiabetes medications. Her A1c is 6.2 down from 7.2 2 years ago. Last CBC, Hgb was WNL.    Ref Range & Units 1 d ago (03/29/22) 2 wk ago (03/16/22) 2 yr ago (04/18/19) 6 yr ago (08/25/15) 6 yr ago (04/20/15) 7 yr ago (12/10/14) 8 yr ago (03/24/14)  Hemoglobin A1C 4.0 - 5.6 % 6.2Abnormal   R  7.2High R, CM  7.1High R, CM  6.6High R, CM  7.2High R, CM  7.0High R, CM    CBC    Component Value Date/Time   WBC 5.7 03/20/2022 0139   RBC 4.82 03/20/2022 0139   HGB 14.4 03/20/2022 0139   HGB 13.7 07/06/2021 1025   HCT 43.8 03/20/2022 0139   HCT 41.1 07/06/2021 1025   PLT 193 03/20/2022 0139   PLT 205 07/06/2021 1025   MCV 90.9 03/20/2022 0139   MCV 88 07/06/2021 1025   MCH 29.9 03/20/2022 0139   MCHC 32.9 03/20/2022 0139   RDW 13.4 03/20/2022 0139   RDW 12.2 07/06/2021 1025   LYMPHSABS 2.7 03/19/2022 1616   LYMPHSABS 2.1 04/21/2020 1530   MONOABS 0.8 03/19/2022 1616   EOSABS 0.2 03/19/2022 1616   EOSABS 0.1 04/21/2020 1530   BASOSABS 0.1 03/19/2022 1616   BASOSABS 0.1 04/21/2020 1530
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
## 2022-04-05 ENCOUNTER — Encounter: Payer: Self-pay | Admitting: Physician Assistant

## 2022-04-05 ENCOUNTER — Ambulatory Visit (INDEPENDENT_AMBULATORY_CARE_PROVIDER_SITE_OTHER): Payer: Medicare Other | Admitting: Physician Assistant

## 2022-04-05 VITALS — BP 140/60 | HR 100 | Ht 64.0 in | Wt 186.0 lb

## 2022-04-05 DIAGNOSIS — I484 Atypical atrial flutter: Secondary | ICD-10-CM

## 2022-04-05 DIAGNOSIS — I5032 Chronic diastolic (congestive) heart failure: Secondary | ICD-10-CM | POA: Diagnosis not present

## 2022-04-05 DIAGNOSIS — I2510 Atherosclerotic heart disease of native coronary artery without angina pectoris: Secondary | ICD-10-CM | POA: Diagnosis not present

## 2022-04-05 DIAGNOSIS — I350 Nonrheumatic aortic (valve) stenosis: Secondary | ICD-10-CM | POA: Diagnosis not present

## 2022-04-05 DIAGNOSIS — N1831 Chronic kidney disease, stage 3a: Secondary | ICD-10-CM

## 2022-04-05 DIAGNOSIS — I10 Essential (primary) hypertension: Secondary | ICD-10-CM

## 2022-04-05 NOTE — Assessment & Plan Note (Addendum)
She remains in AFlutter.  Her HR is controlled.  Her beta-blocker was DCd in the hospital due to bradycardia.  I reviewed her chart.  She was in NSR during her last 2 OP visits prior to her admission.  Question if her CHF exacerbation was brought on by development of AFlutter.  She is improved at this time but overall, NSR would be more beneficial for her.  She has not missed any doses of Eliquis.  I have recommended that she undergo DCCV to restore sinus rhythm.  I have reviewed with Dr. Katrinka Blazing (attending MD) who agreed.    Arrange DCCV  Labs today (BMET, CBC)  F/u with AFib clinic post DCCV

## 2022-04-05 NOTE — Assessment & Plan Note (Signed)
>>  ASSESSMENT AND PLAN FOR (HFPEF) HEART FAILURE WITH PRESERVED EJECTION FRACTION (HCC) WRITTEN ON 04/05/2022 12:05 PM BY [place] Pirie, [place] T, PA-C  Echocardiogram in May 2023 demonstrated normal EF.  Her volume is improved (weight is down) since DC from the hospital.  Her volume status appears stable on HCTZ only.  She has a hx of angioedema with Lisinopril.  Therefore, she is not a candidate for [employer].  We could consider Spironolactone +/- SGLT2 inhibitor.

## 2022-04-05 NOTE — Assessment & Plan Note (Signed)
Echocardiogram in May 2023 demonstrated normal EF.  Her volume is improved (weight is down) since DC from the hospital.  Her volume status appears stable on HCTZ only.  She has a hx of angioedema with Lisinopril.  Therefore, she is not a candidate for [employer].  We could consider Spironolactone +/- SGLT2 inhibitor.

## 2022-04-05 NOTE — Assessment & Plan Note (Signed)
TAVR stable on recent echocardiogram.  Continue SBE prophylaxis. 

## 2022-04-05 NOTE — Patient Instructions (Signed)
Medication Instructions:   Your physician recommends that you continue on your current medications as directed. Please refer to the Current Medication list given to you today.   *If you need a refill on your cardiac medications before your next appointment, please call your pharmacy*   Lab Work:  Your physician recommends that you return for lab work on Wednesday, June 21. You can come in on the day of your appointment anytime between 7:30-4:30.    If you have labs (blood work) drawn today and your tests are completely normal, you will receive your results only by: [place] (if you have MyChart) OR A paper copy in the mail If you have any lab test that is abnormal or we need to change your treatment, we will call you to review the results.   Testing/Procedures:  Dear [name] You are scheduled for a Cardioversion on Friday, June 23 with Dr. Stanford Breed.  Please arrive at the [facility] (Main Entrance A) at [facility]: [address] [place], [place] [address] at 11:30 am22 (1 hour prior to procedure unless lab work is needed; if lab work is needed arrive 1.5 hours ahead)  DIET: Nothing to eat or drink after midnight except a sip of water with medications (see medication instructions below)  FYI: For your safety, and to allow [place] to monitor your vital signs accurately during the surgery/procedure we request that   if you have artificial nails, gel coating, SNS etc. Please have those removed prior to your surgery/procedure. Not having the nail coverings /polish removed may result in cancellation or delay of your surgery/procedure.   Medication Instructions: Hold None  Continue your anticoagulant: Eliquis You will need to continue your anticoagulant after your procedure until you  are told by your  Provider that it is safe to stop   Labs: If patient is on Coumadin, patient needs pt/INR, CBC, BMET within 3 days (No pt/INR needed for patients taking Xarelto,  Eliquis, Pradaxa) For patients receiving anesthesia for TEE and all Cardioversion patients: BMET, CBC within 1 week  Come to: [employer] to the lab at [facility] between the hours of 8:00 am and 4:30 pm. You do not have to be fasting. You must have a responsible person to drive you home and stay in the waiting area during your procedure. Failure to do so could result in cancellation.  Bring your insurance cards.  *Special Note: Every effort is made to have your procedure done on time. Occasionally there are emergencies that occur at the hospital that may cause delays. Please be patient if a delay does occur.    AFIB CLINIC INFORMATION: Your appointment is scheduled on: Friday, July 7 at 11:00 am. Please arrive 15 minutes early for check-in. The AFib Clinic is located in the Heart and Vascular Specialty Clinics at [facility]. Parking instructions/directions: [address] (off [address]). When you pull in to Entrance C, there is an underground parking garage to your right. The code to enter the garage is 1404. Take the elevators to the first floor. Follow the signs to the Heart and Vascular Specialty Clinics. You will see registration at the end of the hallway.  Phone number: [phone]  Follow-Up: At [facility], you and your health needs are our priority.  As part of our continuing mission to provide you with exceptional heart care, we have created designated Provider Care Teams.  These Care Teams include your primary Cardiologist (physician) and Advanced Practice  Providers (APPs -  Physician Assistants and Nurse Practitioners) who all work together to provide you with the care you need, when you need it.  We recommend signing up for the patient portal called "MyChart".  Sign up information is provided on this After Visit Summary.  MyChart is used to connect with patients for Virtual Visits (Telemedicine).  Patients are able to view lab/test results, encounter notes,  upcoming appointments, etc.  Non-urgent messages can be sent to your provider as well.   To learn more about what you can do with MyChart, go to [email or web address].    Your next appointment:   3 month(s)  The format for your next appointment:   In Person  Provider:   Richardson Dopp, PA-C        Important Information About Sugar

## 2022-04-05 NOTE — Assessment & Plan Note (Signed)
BP above target.  It is improved.  It appears that her PCP was going to restart her Losartan and stop her Hydralazine.  As noted, if her BP continues to run above target, we would try her on spironolactone as long as her renal function is stable.  Continue to monitor for now.

## 2022-04-05 NOTE — Assessment & Plan Note (Signed)
Recent Creatinine stable.  

## 2022-04-05 NOTE — Assessment & Plan Note (Signed)
Non-obstructive CAD by Cath in 2020.  Myoview in 04/2021 was low risk.  She is not having anginal symptoms.  She is not on ASA as she is on Eliquis.  Continue Crestor 10 mg once daily.

## 2022-04-06 ENCOUNTER — Encounter: Payer: Self-pay | Admitting: Internal Medicine

## 2022-04-06 ENCOUNTER — Ambulatory Visit: Payer: Medicare Other | Admitting: Student

## 2022-04-10 ENCOUNTER — Other Ambulatory Visit: Payer: Self-pay | Admitting: Cardiovascular Disease

## 2022-04-11 ENCOUNTER — Encounter (HOSPITAL_COMMUNITY): Payer: Self-pay | Admitting: Cardiology

## 2022-04-12 ENCOUNTER — Ambulatory Visit (INDEPENDENT_AMBULATORY_CARE_PROVIDER_SITE_OTHER): Payer: Medicare Other | Admitting: Internal Medicine

## 2022-04-12 ENCOUNTER — Other Ambulatory Visit: Payer: Self-pay

## 2022-04-12 ENCOUNTER — Telehealth: Payer: Self-pay | Admitting: *Deleted

## 2022-04-12 ENCOUNTER — Other Ambulatory Visit: Payer: Medicare Other

## 2022-04-12 ENCOUNTER — Encounter: Payer: Self-pay | Admitting: Internal Medicine

## 2022-04-12 DIAGNOSIS — I10 Essential (primary) hypertension: Secondary | ICD-10-CM | POA: Diagnosis not present

## 2022-04-12 DIAGNOSIS — I350 Nonrheumatic aortic (valve) stenosis: Secondary | ICD-10-CM

## 2022-04-12 DIAGNOSIS — I484 Atypical atrial flutter: Secondary | ICD-10-CM

## 2022-04-12 DIAGNOSIS — I2510 Atherosclerotic heart disease of native coronary artery without angina pectoris: Secondary | ICD-10-CM

## 2022-04-12 DIAGNOSIS — N1831 Chronic kidney disease, stage 3a: Secondary | ICD-10-CM

## 2022-04-12 DIAGNOSIS — I5032 Chronic diastolic (congestive) heart failure: Secondary | ICD-10-CM

## 2022-04-12 DIAGNOSIS — Z79899 Other long term (current) drug therapy: Secondary | ICD-10-CM

## 2022-04-12 LAB — BASIC METABOLIC PANEL
BUN/Creatinine Ratio: 21 (ref 12–28)
BUN: 22 mg/dL (ref 8–27)
CO2: 28 mmol/L (ref 20–29)
Calcium: 10 mg/dL (ref 8.7–10.3)
Chloride: 99 mmol/L (ref 96–106)
Creatinine, Ser: 1.03 mg/dL — ABNORMAL HIGH (ref 0.57–1.00)
Glucose: 155 mg/dL — ABNORMAL HIGH (ref 70–99)
Potassium: 3.3 mmol/L — ABNORMAL LOW (ref 3.5–5.2)
Sodium: 138 mmol/L (ref 134–144)
eGFR: 52 mL/min/{1.73_m2} — ABNORMAL LOW (ref >59)

## 2022-04-12 LAB — CBC
Hematocrit: 44.3 % (ref 34.0–46.6)
Hemoglobin: 14.8 g/dL (ref 11.1–15.9)
MCH: 29.4 pg (ref 26.6–33.0)
MCHC: 33.4 g/dL (ref 31.5–35.7)
MCV: 88 fL (ref 79–97)
Platelets: 199 10*3/uL (ref 150–450)
RBC: 5.03 x10E6/uL (ref 3.77–5.28)
RDW: 13.7 % (ref 11.7–15.4)
WBC: 4.9 10*3/uL (ref 3.4–10.8)

## 2022-04-12 MED ORDER — LEVOTHYROXINE SODIUM 100 MCG PO TABS
100.0000 ug | ORAL_TABLET | Freq: Every day | ORAL | 0 refills | Status: DC
Start: 2022-04-12 — End: 2022-05-17

## 2022-04-12 MED ORDER — LOSARTAN POTASSIUM 50 MG PO TABS
50.0000 mg | ORAL_TABLET | Freq: Every day | ORAL | 3 refills | Status: DC
Start: 2022-04-12 — End: 2022-05-17

## 2022-04-12 MED ORDER — POTASSIUM CHLORIDE CRYS ER 20 MEQ PO TBCR: EXTENDED_RELEASE_TABLET | ORAL | 3 refills | Status: DC

## 2022-04-12 MED ORDER — HYDROCHLOROTHIAZIDE 25 MG PO TABS
25.0000 mg | ORAL_TABLET | Freq: Every day | ORAL | 0 refills | Status: DC
Start: 2022-04-12 — End: 2022-05-17

## 2022-04-12 NOTE — Telephone Encounter (Signed)
-----   Message from Beatrice Lecher, [place] sent at 04/12/2022  2:51 PM EDT ----- Creatinine stable.  K+ low.  Hgb normal.  PLAN:  -Start K+ >> Take 40 mEq x 1 today, then start 20 mEq once daily tomorrow -Repeat K+ level at short stay on day of DCCV -BMET 1 week Tereso Newcomer, PA-C    04/12/2022 2:49 PM
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
## 2022-04-12 NOTE — Patient Instructions (Signed)
Thank you, [name] for allowing [place] to provide your care today. Today we discussed your blood pressure.  I am STOPPING hydralazine and STARTING losartan 50 mg daily. We will see you in 2-4 weeks for a blood pressure check.    I have ordered the following labs for you:  Lab Orders  No laboratory test(s) ordered today     Referrals ordered today:   Referral Orders  No referral(s) requested today     I have ordered the following medication/changed the following medications:   Stop: Hydralazine  Blood pressure medications: Amlodipine, losartan, and hydrochlorothiazide (3 blood pressure meds)  Follow up:  2-4 weeks     Should you have any questions or concerns please call the internal medicine clinic at [phone].

## 2022-04-12 NOTE — Progress Notes (Signed)
Patient left the office without completing awv. This encounter was created in error - please disregard.
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
## 2022-04-12 NOTE — Assessment & Plan Note (Signed)
BP Readings from Last 3 Encounters:  04/12/22 139/64  04/05/22 140/60  03/29/22 133/73   The patient is here today for a BP check. She is currently on amlodipine 10 mg daily, hydralazine 100 mg BID, and HCTZ 25 mg daily. Took these medications this AM as well. Family states that she is getting labwork later today per her cardiologist in preparation for her cardioversion on Friday.  Plan: BMP from early June was reviewed. Will restart losartan 50 mg daily and stop hydralazine. Follow-up in 2-4 weeks and will need BMP at that time. -Continue amlodipine -Continue HCTZ -STOP hydralazine -START losartan 50 mg daily

## 2022-04-14 ENCOUNTER — Ambulatory Visit (HOSPITAL_BASED_OUTPATIENT_CLINIC_OR_DEPARTMENT_OTHER): Payer: Medicare Other | Admitting: Certified Registered"

## 2022-04-14 ENCOUNTER — Other Ambulatory Visit: Payer: Self-pay

## 2022-04-14 ENCOUNTER — Ambulatory Visit (HOSPITAL_COMMUNITY)
Admission: RE | Admit: 2022-04-14 | Discharge: 2022-04-14 | Disposition: A | Discharge status: Home / Self Care | Payer: Medicare Other | Source: Ambulatory Visit | Attending: Cardiology | Admitting: Cardiology

## 2022-04-14 ENCOUNTER — Encounter (HOSPITAL_COMMUNITY)
Admission: RE | Disposition: A | Discharge status: Home / Self Care | Payer: Self-pay | Source: Ambulatory Visit | Attending: Cardiology

## 2022-04-14 ENCOUNTER — Encounter (HOSPITAL_COMMUNITY): Payer: Self-pay | Admitting: Cardiology

## 2022-04-14 ENCOUNTER — Ambulatory Visit (HOSPITAL_COMMUNITY): Payer: Medicare Other | Admitting: Certified Registered"

## 2022-04-14 DIAGNOSIS — I10 Essential (primary) hypertension: Secondary | ICD-10-CM | POA: Diagnosis not present

## 2022-04-14 DIAGNOSIS — N1831 Chronic kidney disease, stage 3a: Secondary | ICD-10-CM | POA: Diagnosis not present

## 2022-04-14 DIAGNOSIS — Z79899 Other long term (current) drug therapy: Secondary | ICD-10-CM | POA: Insufficient documentation

## 2022-04-14 DIAGNOSIS — I2510 Atherosclerotic heart disease of native coronary artery without angina pectoris: Secondary | ICD-10-CM | POA: Insufficient documentation

## 2022-04-14 DIAGNOSIS — I480 Paroxysmal atrial fibrillation: Secondary | ICD-10-CM | POA: Diagnosis present

## 2022-04-14 DIAGNOSIS — I5032 Chronic diastolic (congestive) heart failure: Secondary | ICD-10-CM | POA: Insufficient documentation

## 2022-04-14 DIAGNOSIS — I4891 Unspecified atrial fibrillation: Secondary | ICD-10-CM

## 2022-04-14 DIAGNOSIS — I130 Hypertensive heart and chronic kidney disease with heart failure and stage 1 through stage 4 chronic kidney disease, or unspecified chronic kidney disease: Secondary | ICD-10-CM | POA: Diagnosis not present

## 2022-04-14 DIAGNOSIS — Z952 Presence of prosthetic heart valve: Secondary | ICD-10-CM | POA: Insufficient documentation

## 2022-04-14 DIAGNOSIS — E039 Hypothyroidism, unspecified: Secondary | ICD-10-CM

## 2022-04-14 DIAGNOSIS — E1122 Type 2 diabetes mellitus with diabetic chronic kidney disease: Secondary | ICD-10-CM | POA: Insufficient documentation

## 2022-04-14 DIAGNOSIS — I4892 Unspecified atrial flutter: Secondary | ICD-10-CM | POA: Diagnosis not present

## 2022-04-14 DIAGNOSIS — I484 Atypical atrial flutter: Secondary | ICD-10-CM

## 2022-04-14 HISTORY — PX: CARDIOVERSION: SHX1299

## 2022-04-14 SURGERY — CARDIOVERSION
Anesthesia: Monitor Anesthesia Care

## 2022-04-14 MED ORDER — HYDROCORTISONE 1 % EX CREA: 1.0000 "application " | TOPICAL_CREAM | Freq: Three times a day (TID) | CUTANEOUS | Status: DC | PRN

## 2022-04-14 MED ORDER — SODIUM CHLORIDE 0.9 % IV SOLN: INTRAVENOUS | Status: DC

## 2022-04-14 MED ORDER — LIDOCAINE HCL (CARDIAC) PF 100 MG/5ML IV SOSY
PREFILLED_SYRINGE | INTRAVENOUS | Status: DC | PRN
  Administered 2022-04-14: 50 mg via INTRAVENOUS

## 2022-04-14 MED ORDER — HYDROCORTISONE 1 % EX CREA: 1.0000 | TOPICAL_CREAM | Freq: Three times a day (TID) | CUTANEOUS | Status: DC | PRN

## 2022-04-14 MED ORDER — PROPOFOL 10 MG/ML IV BOLUS
INTRAVENOUS | Status: DC | PRN
  Administered 2022-04-14: 10 mg via INTRAVENOUS
  Administered 2022-04-14: 50 mg via INTRAVENOUS

## 2022-04-14 NOTE — Anesthesia Preprocedure Evaluation (Addendum)
Anesthesia Evaluation  Patient identified by MRN, date of birth, ID band Patient awake    Reviewed: Allergy & Precautions, NPO status , Patient's Chart, lab work & pertinent test results  History of Anesthesia Complications Negative for: history of anesthetic complications  Airway Mallampati: II  TM Distance: >3 FB Neck ROM: Full    Dental   Pulmonary neg pulmonary ROS,    Pulmonary exam normal        Cardiovascular hypertension, + CAD  + dysrhythmias Atrial Fibrillation + Valvular Problems/Murmurs (s/p TAVR)  Rhythm:Irregular     Neuro/Psych negative neurological ROS     GI/Hepatic negative GI ROS, Neg liver ROS,   Endo/Other  diabetesHypothyroidism   Renal/GU negative Renal ROS  negative genitourinary   Musculoskeletal negative musculoskeletal ROS (+)   Abdominal   Peds  Hematology negative hematology ROS (+)   Anesthesia Other Findings   Reproductive/Obstetrics                           Anesthesia Physical Anesthesia Plan  ASA: 3  Anesthesia Plan: General   Post-op Pain Management:    Induction: Intravenous  PONV Risk Score and Plan: TIVA and Treatment may vary due to age or medical condition  Airway Management Planned: Mask  Additional Equipment: None  Intra-op Plan:   Post-operative Plan:   Informed Consent: I have reviewed the patients History and Physical, chart, labs and discussed the procedure including the risks, benefits and alternatives for the proposed anesthesia with the patient or authorized representative who has indicated his/her understanding and acceptance.       Plan Discussed with:   Anesthesia Plan Comments:        Anesthesia Quick Evaluation

## 2022-04-17 ENCOUNTER — Encounter (HOSPITAL_COMMUNITY): Payer: Self-pay | Admitting: Cardiology

## 2022-04-19 ENCOUNTER — Other Ambulatory Visit: Payer: Medicare Other

## 2022-04-19 DIAGNOSIS — Z79899 Other long term (current) drug therapy: Secondary | ICD-10-CM

## 2022-04-20 ENCOUNTER — Telehealth: Payer: Self-pay | Admitting: *Deleted

## 2022-04-20 DIAGNOSIS — Z79899 Other long term (current) drug therapy: Secondary | ICD-10-CM

## 2022-04-20 LAB — BASIC METABOLIC PANEL
BUN/Creatinine Ratio: 17 (ref 12–28)
BUN: 21 mg/dL (ref 8–27)
CO2: 21 mmol/L (ref 20–29)
Calcium: 10.2 mg/dL (ref 8.7–10.3)
Chloride: 104 mmol/L (ref 96–106)
Creatinine, Ser: 1.25 mg/dL — ABNORMAL HIGH (ref 0.57–1.00)
Glucose: 102 mg/dL — ABNORMAL HIGH (ref 70–99)
Potassium: 4.9 mmol/L (ref 3.5–5.2)
Sodium: 143 mmol/L (ref 134–144)
eGFR: 41 mL/min/{1.73_m2} — ABNORMAL LOW (ref >59)

## 2022-04-20 NOTE — Telephone Encounter (Signed)
-----   Message from Beatrice Lecher, [place] sent at 04/20/2022 10:29 AM EDT ----- Creatinine stable.  K+ normal.  PLAN:  -Continue current medications/treatment plan and follow up as scheduled.  -Repeat BMET 3 weeks Tereso Newcomer, PA-C    04/20/2022 10:25 AM

## 2022-04-28 ENCOUNTER — Encounter (HOSPITAL_COMMUNITY): Payer: Self-pay | Admitting: Physician Assistant

## 2022-04-28 ENCOUNTER — Ambulatory Visit (HOSPITAL_COMMUNITY)
Admission: RE | Admit: 2022-04-28 | Discharge: 2022-04-28 | Disposition: A | Discharge status: Home / Self Care | Payer: Medicare Other | Source: Ambulatory Visit | Attending: Physician Assistant | Admitting: Physician Assistant

## 2022-04-28 VITALS — BP 124/74 | HR 58 | Ht 64.0 in | Wt 177.6 lb

## 2022-04-28 DIAGNOSIS — I484 Atypical atrial flutter: Secondary | ICD-10-CM | POA: Diagnosis not present

## 2022-04-28 DIAGNOSIS — Z6830 Body mass index (BMI) 30.0-30.9, adult: Secondary | ICD-10-CM | POA: Insufficient documentation

## 2022-04-28 DIAGNOSIS — I4819 Other persistent atrial fibrillation: Secondary | ICD-10-CM | POA: Insufficient documentation

## 2022-04-28 DIAGNOSIS — I110 Hypertensive heart disease with heart failure: Secondary | ICD-10-CM | POA: Insufficient documentation

## 2022-04-28 DIAGNOSIS — I5032 Chronic diastolic (congestive) heart failure: Secondary | ICD-10-CM | POA: Insufficient documentation

## 2022-04-28 DIAGNOSIS — D6869 Other thrombophilia: Secondary | ICD-10-CM | POA: Diagnosis not present

## 2022-04-28 DIAGNOSIS — I2510 Atherosclerotic heart disease of native coronary artery without angina pectoris: Secondary | ICD-10-CM | POA: Insufficient documentation

## 2022-04-28 DIAGNOSIS — Z7901 Long term (current) use of anticoagulants: Secondary | ICD-10-CM | POA: Insufficient documentation

## 2022-04-28 DIAGNOSIS — E118 Type 2 diabetes mellitus with unspecified complications: Secondary | ICD-10-CM | POA: Diagnosis not present

## 2022-04-28 DIAGNOSIS — I350 Nonrheumatic aortic (valve) stenosis: Secondary | ICD-10-CM | POA: Diagnosis not present

## 2022-04-28 DIAGNOSIS — E785 Hyperlipidemia, unspecified: Secondary | ICD-10-CM | POA: Insufficient documentation

## 2022-04-28 DIAGNOSIS — E669 Obesity, unspecified: Secondary | ICD-10-CM | POA: Insufficient documentation
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
## 2022-05-10 ENCOUNTER — Other Ambulatory Visit: Payer: Medicare Other

## 2022-05-10 DIAGNOSIS — Z79899 Other long term (current) drug therapy: Secondary | ICD-10-CM

## 2022-05-10 LAB — BASIC METABOLIC PANEL
BUN/Creatinine Ratio: 23 (ref 12–28)
BUN: 29 mg/dL — ABNORMAL HIGH (ref 8–27)
CO2: 21 mmol/L (ref 20–29)
Calcium: 10.6 mg/dL — ABNORMAL HIGH (ref 8.7–10.3)
Chloride: 99 mmol/L (ref 96–106)
Creatinine, Ser: 1.24 mg/dL — ABNORMAL HIGH (ref 0.57–1.00)
Glucose: 132 mg/dL — ABNORMAL HIGH (ref 70–99)
Potassium: 4.4 mmol/L (ref 3.5–5.2)
Sodium: 138 mmol/L (ref 134–144)
eGFR: 42 mL/min/{1.73_m2} — ABNORMAL LOW (ref >59)

## 2022-05-16 ENCOUNTER — Ambulatory Visit: Payer: Medicare Other

## 2022-05-16 NOTE — Progress Notes (Unsigned)
[facility]: Clinic Note  Subjective:  History of Present Illness: [name] is a 88 y.o. year old female who presents for hospital follow up   Cardioversion on 6/23  # Atrial flutter - Underwent cardioversion on 6/23 with cardiology - does she has Afib follow up? - Eliquis  # HFpEF - could consider spironolactone or SGLT2 inhibitor - weight stable on HCTZ only  # CAD - nonobstructive by LHC in 2020 - crestor 10  # s/p TAVR  # HTN - had angioedema with ACE, so don't do ARB or Entresto - would do spironolactone & SGLT2 if kidneys stable       Please refer to Assessment and Plan below for full details in Problem-Based Charting.   Past Medical History:  Patient Active Problem List   Diagnosis Date Noted   Secondary hypercoagulable state (HCC) 04/28/2022   CAD (coronary artery disease) 04/05/2022   Bradycardia    Atrial flutter (HCC)    (HFpEF) heart failure with preserved ejection fraction (HCC) 03/19/2022   Stage 3a chronic kidney disease (CKD) (HCC) 03/17/2022   Healthcare maintenance 03/17/2022   Hypothyroidism 03/17/2022   Severe aortic stenosis s/p TAVR 04/22/2019   Persistent atrial fibrillation 04/03/2016   Essential hypertension 11/19/2013   Type II diabetes mellitus (HCC) 03/05/2013   Hyperlipidemia    LVH (left ventricular hypertrophy)    Aortic stenosis     Social History: Reviewed in Epic. Pertinent updates today include: ***  Family History: Reviewed in Epic. Pertinent updates today include: None  Medications:  Current Outpatient Medications:    acetaminophen (TYLENOL) 500 MG tablet, Take 500 mg by mouth every 6 (six) hours as needed for mild pain, moderate pain or headache. , Disp: , Rfl:    amLODipine (NORVASC) 10 MG tablet, Take 1 tablet (10 mg total) by mouth daily., Disp: 90 tablet, Rfl: 2   apixaban (ELIQUIS) 5 MG TABS tablet, Take 1 tablet (5 mg total) by mouth 2 (two) times daily. Overdue for follow-up,  MUST see MD for FUTURE refills., Disp: 180 tablet, Rfl: 0   hydrochlorothiazide (HYDRODIURIL) 25 MG tablet, Take 1 tablet (25 mg total) by mouth daily., Disp: 30 tablet, Rfl: 0   latanoprost (XALATAN) 0.005 % ophthalmic solution, Place 1 drop into both eyes at bedtime., Disp: , Rfl:    levothyroxine (SYNTHROID) 100 MCG tablet, Take 1 tablet (100 mcg total) by mouth daily., Disp: 30 tablet, Rfl: 0   losartan (COZAAR) 50 MG tablet, Take 1 tablet (50 mg total) by mouth daily., Disp: 90 tablet, Rfl: 3   potassium chloride SA (KLOR-CON M) 20 MEQ tablet, Take 2 tablets by mouth today only then reduce to 1 tablet by mouth daily, Disp: 91 tablet, Rfl: 3   rosuvastatin (CRESTOR) 10 MG tablet, TAKE 1 TABLET BY MOUTH EVERY OTHER DAY, Disp: 45 tablet, Rfl: 1   Allergies: Allergies  Allergen Reactions   Lisinopril     Angioneurotic edema    Review of Systems: ROS   Objective:   Vitals: There were no vitals filed for this visit.  Physical Exam: Physical Exam   Data: Labs, imaging, and micro were reviewed in Epic. Refer to Assessment and Plan below for full details in Problem-Based Charting.  Assessment & Plan:  No problem-specific Assessment & Plan notes found for this encounter.     Patient will follow up in ***  Mercie Eon, MD

## 2022-05-17 ENCOUNTER — Encounter: Payer: Self-pay | Admitting: Internal Medicine

## 2022-05-17 ENCOUNTER — Other Ambulatory Visit: Payer: Self-pay

## 2022-05-17 ENCOUNTER — Ambulatory Visit (INDEPENDENT_AMBULATORY_CARE_PROVIDER_SITE_OTHER): Payer: Medicare Other

## 2022-05-17 ENCOUNTER — Ambulatory Visit (INDEPENDENT_AMBULATORY_CARE_PROVIDER_SITE_OTHER): Payer: Medicare Other | Admitting: Internal Medicine

## 2022-05-17 VITALS — BP 125/48 | HR 79 | Temp 97.5°F | Ht 64.0 in | Wt 180.7 lb

## 2022-05-17 DIAGNOSIS — I110 Hypertensive heart disease with heart failure: Secondary | ICD-10-CM

## 2022-05-17 DIAGNOSIS — E119 Type 2 diabetes mellitus without complications: Secondary | ICD-10-CM

## 2022-05-17 DIAGNOSIS — Z0000 Encounter for general adult medical examination without abnormal findings: Secondary | ICD-10-CM

## 2022-05-17 DIAGNOSIS — I5032 Chronic diastolic (congestive) heart failure: Secondary | ICD-10-CM

## 2022-05-17 DIAGNOSIS — I484 Atypical atrial flutter: Secondary | ICD-10-CM

## 2022-05-17 DIAGNOSIS — I10 Essential (primary) hypertension: Secondary | ICD-10-CM

## 2022-05-17 HISTORY — DX: Hypercalcemia: E83.52

## 2022-05-17 MED ORDER — ROSUVASTATIN CALCIUM 10 MG PO TABS: 10.0000 mg | ORAL_TABLET | ORAL | 3 refills | Status: DC

## 2022-05-17 MED ORDER — POTASSIUM CHLORIDE CRYS ER 20 MEQ PO TBCR
EXTENDED_RELEASE_TABLET | ORAL | 3 refills | Status: DC
Start: 2022-05-17 — End: 2022-12-06

## 2022-05-17 MED ORDER — LEVOTHYROXINE SODIUM 100 MCG PO TABS: 100.0000 ug | ORAL_TABLET | Freq: Every day | ORAL | 3 refills | Status: DC

## 2022-05-17 MED ORDER — LOSARTAN POTASSIUM 50 MG PO TABS: 50.0000 mg | ORAL_TABLET | Freq: Every day | ORAL | 3 refills | Status: DC

## 2022-05-17 MED ORDER — HYDROCHLOROTHIAZIDE 25 MG PO TABS: 25.0000 mg | ORAL_TABLET | Freq: Every day | ORAL | 3 refills | Status: DC

## 2022-05-17 MED ORDER — APIXABAN 5 MG PO TABS
5.0000 mg | ORAL_TABLET | Freq: Two times a day (BID) | ORAL | 3 refills | Status: DC
Start: 2022-05-17 — End: 2023-05-17

## 2022-05-17 MED ORDER — AMLODIPINE BESYLATE 10 MG PO TABS: 10.0000 mg | ORAL_TABLET | Freq: Every day | ORAL | 3 refills | Status: DC

## 2022-05-17 NOTE — Patient Instructions (Signed)
It was a pleasure seeing you today!  I refilled your medicines, but I did not make any changes to your medicines today. Keep taking everything as you are.   We'll see you back in about 5 months (before the holidays) to follow up on your heart and your labs.   Please call us if you need anything in the meantime.

## 2022-05-17 NOTE — Assessment & Plan Note (Signed)
-   Mild hypercalcemia to 10.6 on recent labs. I suspect this is from HCTZ. No action needed now, but recheck calcium at next visit. If persistently elevated, would get PTH and stop HCTZ. Could switch it to something better for HFrEF.

## 2022-05-17 NOTE — Assessment & Plan Note (Signed)
-   A1C 6.2 in 03/2022 - Not on medicines, none needed right now - Gets eye exams with Dr. Franchot Heidelberg, last was in 11/2021, due in 11/2022

## 2022-05-17 NOTE — Assessment & Plan Note (Signed)
-   She spontaneously converted to normal sinus rhythm, and remains in normal sinus rhythm - Continue Eliquis 5mg  BID - Follow up with Cardiology clinic (this is scheduled) - No beta blockers, as she is in sinus rhythm and also tends to be bradycardic

## 2022-05-17 NOTE — Assessment & Plan Note (Signed)
>>  ASSESSMENT AND PLAN FOR (HFPEF) HEART FAILURE WITH PRESERVED EJECTION FRACTION (HCC) WRITTEN ON 05/17/2022  9:38 AM BY MACHEN, JULIE, MD  - She is euvolemic today, I suspect her atrial flutter was previously causing her to be overloaded - Since she's doing so well on her current medicines (amlodipine, HCTZ, losartan), I'm not going to make any changes today. In the future, we could consider switching CCB/HCTZ to spironolactone & SGLT2, but she is doing fantastic, so I think risk of changing regimen outweighs potential benefit at this time

## 2022-05-17 NOTE — Patient Instructions (Signed)

## 2022-05-17 NOTE — Assessment & Plan Note (Signed)
-   She is euvolemic today, I suspect her atrial flutter was previously causing her to be overloaded - Since she's doing so well on her current medicines (amlodipine, HCTZ, losartan), I'm not going to make any changes today. In the future, we could consider switching CCB/HCTZ to spironolactone & SGLT2, but she is doing fantastic, so I think risk of changing regimen outweighs potential benefit at this time
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
## 2022-05-17 NOTE — Progress Notes (Signed)
I reviewed the AWV findings with the provider who conducted the visit. I was present in the office suite and immediately available to provide assistance and direction throughout the time the service was provided.  

## 2022-05-30 ENCOUNTER — Other Ambulatory Visit: Payer: Self-pay | Admitting: Cardiovascular Disease

## 2022-06-09 ENCOUNTER — Ambulatory Visit (HOSPITAL_COMMUNITY)
Admission: RE | Admit: 2022-06-09 | Discharge: 2022-06-09 | Disposition: A | Discharge status: Home / Self Care | Payer: Medicare Other | Source: Ambulatory Visit | Attending: Physician Assistant | Admitting: Physician Assistant

## 2022-06-09 ENCOUNTER — Encounter (HOSPITAL_COMMUNITY): Payer: Self-pay | Admitting: Physician Assistant

## 2022-06-09 VITALS — BP 134/88 | HR 68 | Ht 64.0 in | Wt 177.6 lb

## 2022-06-09 DIAGNOSIS — I350 Nonrheumatic aortic (valve) stenosis: Secondary | ICD-10-CM | POA: Diagnosis not present

## 2022-06-09 DIAGNOSIS — E785 Hyperlipidemia, unspecified: Secondary | ICD-10-CM | POA: Diagnosis not present

## 2022-06-09 DIAGNOSIS — I484 Atypical atrial flutter: Secondary | ICD-10-CM | POA: Diagnosis not present

## 2022-06-09 DIAGNOSIS — I4892 Unspecified atrial flutter: Secondary | ICD-10-CM | POA: Diagnosis not present

## 2022-06-09 DIAGNOSIS — I5032 Chronic diastolic (congestive) heart failure: Secondary | ICD-10-CM | POA: Diagnosis not present

## 2022-06-09 DIAGNOSIS — Z952 Presence of prosthetic heart valve: Secondary | ICD-10-CM | POA: Insufficient documentation

## 2022-06-09 DIAGNOSIS — I110 Hypertensive heart disease with heart failure: Secondary | ICD-10-CM | POA: Insufficient documentation

## 2022-06-09 DIAGNOSIS — D6869 Other thrombophilia: Secondary | ICD-10-CM | POA: Insufficient documentation

## 2022-06-09 DIAGNOSIS — E119 Type 2 diabetes mellitus without complications: Secondary | ICD-10-CM | POA: Diagnosis not present

## 2022-06-09 DIAGNOSIS — Z7901 Long term (current) use of anticoagulants: Secondary | ICD-10-CM | POA: Insufficient documentation

## 2022-06-09 DIAGNOSIS — I2510 Atherosclerotic heart disease of native coronary artery without angina pectoris: Secondary | ICD-10-CM | POA: Diagnosis not present

## 2022-06-09 DIAGNOSIS — E669 Obesity, unspecified: Secondary | ICD-10-CM | POA: Insufficient documentation

## 2022-06-09 DIAGNOSIS — I4819 Other persistent atrial fibrillation: Secondary | ICD-10-CM | POA: Insufficient documentation

## 2022-06-09 DIAGNOSIS — Z6830 Body mass index (BMI) 30.0-30.9, adult: Secondary | ICD-10-CM | POA: Diagnosis not present
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
## 2022-07-05 ENCOUNTER — Encounter: Payer: Self-pay | Admitting: Physician Assistant

## 2022-07-05 ENCOUNTER — Ambulatory Visit: Payer: Medicare Other | Attending: Physician Assistant | Admitting: Physician Assistant

## 2022-07-05 VITALS — BP 150/80 | HR 54 | Ht 64.0 in | Wt 177.4 lb

## 2022-07-05 DIAGNOSIS — I484 Atypical atrial flutter: Secondary | ICD-10-CM

## 2022-07-05 DIAGNOSIS — I350 Nonrheumatic aortic (valve) stenosis: Secondary | ICD-10-CM

## 2022-07-05 DIAGNOSIS — I5032 Chronic diastolic (congestive) heart failure: Secondary | ICD-10-CM

## 2022-07-05 DIAGNOSIS — I2510 Atherosclerotic heart disease of native coronary artery without angina pectoris: Secondary | ICD-10-CM | POA: Diagnosis not present

## 2022-07-05 DIAGNOSIS — I10 Essential (primary) hypertension: Secondary | ICD-10-CM

## 2022-07-05 NOTE — Assessment & Plan Note (Signed)
Blood pressure above target.  I have asked her to monitor blood pressure at home and send some readings for review.  If her blood pressure remains above target, increase losartan to 100 mg daily.

## 2022-07-05 NOTE — Patient Instructions (Addendum)
Medication Instructions:  Your physician recommends that you continue on your current medications as directed. Please refer to the Current Medication list given to you today.  *If you need a refill on your cardiac medications before your next appointment, please call your pharmacy*   Lab Work: None ordered  If you have labs (blood work) drawn today and your tests are completely normal, you will receive your results only by: MyChart Message (if you have MyChart) OR A paper copy in the mail If you have any lab test that is abnormal or we need to change your treatment, we will call you to review the results.   Testing/Procedures: None ordered   Follow-Up: At [facility], you and your health needs are our priority.  As part of our continuing mission to provide you with exceptional heart care, we have created designated Provider Care Teams.  These Care Teams include your primary Cardiologist (physician) and Advanced Practice Providers (APPs -  Physician Assistants and Nurse Practitioners) who all work together to provide you with the care you need, when you need it.  We recommend signing up for the patient portal called "MyChart".  Sign up information is provided on this After Visit Summary.  MyChart is used to connect with patients for Virtual Visits (Telemedicine).  Patients are able to view lab/test results, encounter notes, upcoming appointments, etc.  Non-urgent messages can be sent to your provider as well.   To learn more about what you can do with MyChart, go to [email or web address].    Your next appointment:   6 month(s)  01/07/22 ARRIVE AT 10:45   The format for your next appointment:   In Person  Provider:   Tonny Bollman, MD     Other Instructions Your physician has requested that you regularly monitor and record your blood pressure readings at home. Please use the same machine at the same time of day to check your readings and record them to bring to your  follow-up visit.   Please monitor blood pressures and keep a log of your readings for 1 week and call me with those.  You can do this when you get back from your Cruise!  I hope you enjoy!    Make sure to check 2 hours after your medications.    AVOID these things for 30 minutes before checking your blood pressure: No Drinking caffeine. No Drinking alcohol. No Eating. No Smoking. No Exercising.   Five minutes before checking your blood pressure: Pee. Sit in a dining chair. Avoid sitting in a soft couch or armchair. Be quiet. Do not talk   Important Information About Sugar

## 2022-07-05 NOTE — Assessment & Plan Note (Signed)
>>  ASSESSMENT AND PLAN FOR (HFPEF) HEART FAILURE WITH PRESERVED EJECTION FRACTION (HCC) WRITTEN ON 07/05/2022 10:26 AM BY Fernand Sorbello T, PA-C  Overall, volume status stable.  NYHA II-IIb.  Her heart rate remains controlled in atrial flutter.  As long as she does not have worsening issues with heart failure control, we can likely pursue a rate control strategy.  Continue HCTZ 25 mg daily, losartan 50 mg daily.  She is not a candidate for Entresto given history of angioedema with ACE inhibitors.  At this point, I do not think she requires treatment with MRA or SGLT2 inhibitor.  Follow-up in 6 months.

## 2022-07-05 NOTE — Assessment & Plan Note (Signed)
Nonobstructive disease by cardiac catheterization in 2020.  Low risk Myoview in July 2022.  She is not having anginal symptoms.  Continue Crestor 10 mg every other day.

## 2022-07-05 NOTE — Assessment & Plan Note (Signed)
Overall, volume status stable.  NYHA II-IIb.  Her heart rate remains controlled in atrial flutter.  As long as she does not have worsening issues with heart failure control, we can likely pursue a rate control strategy.  Continue HCTZ 25 mg daily, losartan 50 mg daily.  She is not a candidate for Entresto given history of angioedema with ACE inhibitors.  At this point, I do not think she requires treatment with MRA or SGLT2 inhibitor.  Follow-up in 6 months.

## 2022-07-05 NOTE — Assessment & Plan Note (Signed)
Normally functioning TAVR by echocardiogram in May 2023.  Continue SBE prophylaxis.

## 2022-07-05 NOTE — Assessment & Plan Note (Signed)
She failed cardioversion but then spontaneously converted to sinus rhythm.  However, she was noted to be back in atrial flutter when she followed up in the atrial fibrillation clinic.  She prefers to avoid antiarrhythmic drug therapy or repeat cardioversion.  As long as she remains stable, I think that this is reasonable.  Continue Eliquis 5 mg twice daily.  Follow-up in 6 months.

## 2022-08-08 ENCOUNTER — Encounter: Payer: Self-pay | Admitting: Dietician

## 2022-08-09 ENCOUNTER — Ambulatory Visit (INDEPENDENT_AMBULATORY_CARE_PROVIDER_SITE_OTHER): Payer: Medicare Other | Admitting: Internal Medicine

## 2022-08-09 VITALS — BP 150/77 | HR 62 | Temp 97.8°F | Ht 64.0 in | Wt 183.1 lb

## 2022-08-09 DIAGNOSIS — E119 Type 2 diabetes mellitus without complications: Secondary | ICD-10-CM

## 2022-08-09 DIAGNOSIS — E7800 Pure hypercholesterolemia, unspecified: Secondary | ICD-10-CM | POA: Diagnosis not present

## 2022-08-09 DIAGNOSIS — I10 Essential (primary) hypertension: Secondary | ICD-10-CM

## 2022-08-09 DIAGNOSIS — I484 Atypical atrial flutter: Secondary | ICD-10-CM

## 2022-08-09 DIAGNOSIS — Z23 Encounter for immunization: Secondary | ICD-10-CM | POA: Diagnosis not present

## 2022-08-09 MED ORDER — LOSARTAN POTASSIUM 100 MG PO TABS
100.0000 mg | ORAL_TABLET | Freq: Every day | ORAL | 2 refills | Status: DC
Start: 2022-08-09 — End: 2022-10-24

## 2022-08-09 NOTE — Patient Instructions (Signed)
Mrs. Disbro,  It was a pleasure to care for you today. I'm glad you are doing well!  I would like to increase your losartan, which is for blood pressure, to 100 mg daily. I will let you know what your lab results show.  Please return to clinic in about 4-6 weeks so we can recheck your blood pressure and bring your home blood pressure cuff with you.  My best, Dr. Marlou Sa

## 2022-08-09 NOTE — Assessment & Plan Note (Signed)
Calcium elevated in July 2023 to 10.6. This could be secondary to HCTZ.  Plan:Recheck BMP today. If still elevated, consider starting hypercalcemia work-up by checking PTH.

## 2022-08-09 NOTE — Assessment & Plan Note (Signed)
Current regimen is rosuvastatin 10 mg daily. Last checked in 08/2015, LDL was 113. Plan:Check lipid panel today. Continue current regimen.

## 2022-08-09 NOTE — Assessment & Plan Note (Signed)
BP uncontrolled today, 157/109, with recheck of 150/77. Current regimen is amlodipine 10 mg daily, HCTZ 25 mg daily, losartan 50 mg daily. Plan:Increase losartan to 100 mg daily, continue amlodipine 10 mg daily and HCTZ 25 mg daily. If this new dose of losartan is adequate for BP control, consider starting a combination pill to reduce pill burden on the patient.

## 2022-08-09 NOTE — Assessment & Plan Note (Signed)
Normal heart rate and regularly regular rhythm on exam today. She denies palpitations or chest pain/discomfort. Currently on Eliquis 5 mg BID. Plan:Continue current regimen.

## 2022-08-09 NOTE — Assessment & Plan Note (Signed)
HbA1c 6.2% in 03/2022. Currently diet controlled and does not check her blood glucose at home. Plan:Check HbA1c at next OV (interval of 6 months for rechecks).

## 2022-08-09 NOTE — Assessment & Plan Note (Signed)
Pneumonia vaccine given 
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
## 2022-08-10 ENCOUNTER — Telehealth: Payer: Self-pay | Admitting: Internal Medicine

## 2022-08-10 LAB — LIPID PANEL
Chol/HDL Ratio: 2 ratio (ref 0.0–4.4)
Cholesterol, Total: 128 mg/dL (ref 100–199)
HDL: 63 mg/dL (ref >39)
LDL Chol Calc (NIH): 51 mg/dL (ref 0–99)
Triglycerides: 71 mg/dL (ref 0–149)
VLDL Cholesterol Cal: 14 mg/dL (ref 5–40)

## 2022-08-10 LAB — BMP8+ANION GAP
Anion Gap: 16 mmol/L (ref 10.0–18.0)
BUN/Creatinine Ratio: 20 (ref 12–28)
BUN: 22 mg/dL (ref 8–27)
CO2: 22 mmol/L (ref 20–29)
Calcium: 9.9 mg/dL (ref 8.7–10.3)
Chloride: 101 mmol/L (ref 96–106)
Creatinine, Ser: 1.11 mg/dL — ABNORMAL HIGH (ref 0.57–1.00)
Glucose: 98 mg/dL (ref 70–99)
Potassium: 4.6 mmol/L (ref 3.5–5.2)
Sodium: 139 mmol/L (ref 134–144)
eGFR: 48 mL/min/{1.73_m2} — ABNORMAL LOW (ref >59)

## 2022-08-10 NOTE — Progress Notes (Signed)
Internal Medicine Clinic Attending ? ?Case discussed with Dr. Dean  At the time of the visit.  We reviewed the resident?s history and exam and pertinent patient test results.  I agree with the assessment, diagnosis, and plan of care documented in the resident?s note.  ?

## 2022-08-10 NOTE — Telephone Encounter (Signed)
Attempted to contact patient to discuss lab results from last OV however there was no answer. I left a message requesting a return call to the clinic to discuss these results.  Avamarie Crossley, DO 

## 2022-08-10 NOTE — Progress Notes (Signed)
Hypercalcemia previously noted has resolved.  Remainder of findings are consistent with known CKD 3a.   LDL at goal, 51.  No changes in current management.

## 2022-08-11 ENCOUNTER — Telehealth: Payer: Self-pay | Admitting: Student

## 2022-08-11 ENCOUNTER — Encounter: Payer: Self-pay | Admitting: Internal Medicine

## 2022-08-11 NOTE — Telephone Encounter (Signed)
Received a page to call patient back. Patient would like to know the results of her lipid panel and BMP. Her creatine improves compared to 3 months ago, GFR 48. Hypercalcemia resolved. LDL well controlled at 51. No change in current regimen. All questions were answered

## 2022-09-06 ENCOUNTER — Encounter: Payer: Self-pay | Admitting: Student

## 2022-09-06 ENCOUNTER — Other Ambulatory Visit: Payer: Self-pay

## 2022-09-06 ENCOUNTER — Ambulatory Visit (INDEPENDENT_AMBULATORY_CARE_PROVIDER_SITE_OTHER): Payer: Medicare Other | Admitting: Student

## 2022-09-06 VITALS — BP 157/87 | HR 84 | Temp 97.5°F | Ht 64.0 in | Wt 181.7 lb

## 2022-09-06 DIAGNOSIS — I10 Essential (primary) hypertension: Secondary | ICD-10-CM

## 2022-09-06 DIAGNOSIS — E119 Type 2 diabetes mellitus without complications: Secondary | ICD-10-CM

## 2022-09-06 NOTE — Patient Instructions (Signed)
[name], it was a pleasure seeing you today!  Today we discussed: - Blood pressure: Continue with your current regimen. I would like for you to follow-up with Dr. Lafonda Mosses in three months.  I have ordered the following labs today:  Lab Orders         BMP8+Anion Gap      Follow-up: 3 months   Please make sure to arrive 15 minutes prior to your next appointment. If you arrive late, you may be asked to reschedule.   We look forward to seeing you next time. Please call our clinic at [phone] if you have any questions or concerns. The best time to call is Monday-Friday from 9am-4pm, but there is someone available 24/7. If after hours or the weekend, call the main hospital number and ask for the Internal Medicine Resident On-Call. If you need medication refills, please notify your pharmacy one week in advance and they will send [place] a request.  Thank you for letting us take part in your care. Wishing you the best!  Thank you, Evlyn Kanner, MD
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
## 2022-09-06 NOTE — Assessment & Plan Note (Addendum)
BP Readings from Last 3 Encounters:  09/06/22 (!) 157/87  08/09/22 (!) 150/77  07/05/22 (!) 150/80   [name] is returning to clinic for blood pressure check. Blood pressure 138/90 after re-check. Denies any lightheadedness, dizziness, syncopal episodes, chest pain, dyspnea. Given her age, I do not believe she needs further anti-hypertensive effect and would not add any diuretic or AV nodal blocking agent. Overall she is doing well and her cardiovascular risk factors are well-controlled. Can consider switching diuretic to an alternative agent in the future, but will defer this to her primary care physician. I will have her follow-up with Dr. Lafonda Mosses in a few months.   - Continue losartan 100mg  daily - Amlodipine 10mg  daily - Hydrochlorothiazide 25mg  daily - BMP today - Follow-up with Dr. in three months

## 2022-09-07 LAB — BMP8+ANION GAP
Anion Gap: 14 mmol/L (ref 10.0–18.0)
BUN/Creatinine Ratio: 18 (ref 12–28)
BUN: 22 mg/dL (ref 8–27)
CO2: 24 mmol/L (ref 20–29)
Calcium: 10.5 mg/dL — ABNORMAL HIGH (ref 8.7–10.3)
Chloride: 99 mmol/L (ref 96–106)
Creatinine, Ser: 1.23 mg/dL — ABNORMAL HIGH (ref 0.57–1.00)
Glucose: 134 mg/dL — ABNORMAL HIGH (ref 70–99)
Potassium: 5 mmol/L (ref 3.5–5.2)
Sodium: 137 mmol/L (ref 134–144)
eGFR: 42 mL/min/{1.73_m2} — ABNORMAL LOW (ref >59)

## 2022-09-12 ENCOUNTER — Encounter: Payer: Self-pay | Admitting: Student

## 2022-09-17 NOTE — Progress Notes (Signed)
Internal Medicine Clinic Attending  Case discussed with Dr. Marijo Conception  At the time of the visit.  We reviewed the resident's history and exam and pertinent patient test results.  I agree with the assessment, diagnosis, and plan of care documented in the resident's note.  Mild hypercalcemia is almost certainly related to thiazide, so w/u with PTH isn't necessary unless the elevation persists after a trial off the HCTZ. She appears to be moving from [facility] to 3[facility].

## 2022-10-24 ENCOUNTER — Other Ambulatory Visit: Payer: Self-pay | Admitting: Internal Medicine

## 2022-11-06 ENCOUNTER — Other Ambulatory Visit: Payer: Self-pay | Admitting: Internal Medicine

## 2022-12-05 LAB — HM DIABETES EYE EXAM

## 2022-12-05 NOTE — Progress Notes (Unsigned)
[facility]: Clinic Note  Subjective:  History of Present Illness: [name] is a 89 y.o. year old female who presents for routine follow up of her chronic medical conditions. I saw her on 05/17/22 for hospital follow up.   She is doing great. No concerns today.  Please refer to Assessment and Plan below for full details in Problem-Based Charting.   Past Medical History:  Patient Active Problem List   Diagnosis Date Noted   Need for vaccination with 20-polyvalent pneumococcal conjugate vaccine 08/09/2022   Hypercalcemia 05/17/2022   Secondary hypercoagulable state ([place]) 04/28/2022   CAD (coronary artery disease) 04/05/2022   Atrial flutter (HCC)    (HFpEF) heart failure with preserved ejection fraction ([place]) 03/19/2022   Stage 3a chronic kidney disease (CKD) ([place]) 03/17/2022   Hypothyroidism 03/17/2022   Severe aortic stenosis s/p TAVR 04/22/2019   Essential hypertension 11/19/2013   Type II diabetes mellitus ([place]) 03/05/2013   Hyperlipidemia       Medications:  Current Outpatient Medications:    spironolactone (ALDACTONE) 25 MG tablet, Take 1 tablet (25 mg total) by mouth daily., Disp: 90 tablet, Rfl: 3   acetaminophen (TYLENOL) 500 MG tablet, Take 500 mg by mouth every 6 (six) hours as needed for mild pain, moderate pain or headache. , Disp: , Rfl:    amLODipine (NORVASC) 10 MG tablet, Take 1 tablet (10 mg total) by mouth daily., Disp: 90 tablet, Rfl: 3   apixaban (ELIQUIS) 5 MG TABS tablet, Take 1 tablet (5 mg total) by mouth 2 (two) times daily. Overdue for follow-up, MUST see MD for FUTURE refills., Disp: 180 tablet, Rfl: 3   latanoprost (XALATAN) 0.005 % ophthalmic solution, Place 1 drop into both eyes at bedtime., Disp: , Rfl:    levothyroxine (SYNTHROID) 100 MCG tablet, Take 1 tablet (100 mcg total) by mouth daily., Disp: 90 tablet, Rfl: 3   losartan (COZAAR) 100 MG tablet, Take 1 tablet (100 mg total) by mouth daily., Disp: 90 tablet,  Rfl: 3   rosuvastatin (CRESTOR) 10 MG tablet, Take 1 tablet (10 mg total) by mouth every other day., Disp: 45 tablet, Rfl: 3   Allergies: Allergies  Allergen Reactions   Lisinopril     Angioneurotic edema    Objective:   Vitals: Vitals:   12/06/22 1014 12/06/22 1059  BP: (!) 162/82 (!) 163/85  Pulse: 83 (!) 50  Temp: 97.8 F (36.6 C)   SpO2: 100%     Physical Exam: Physical Exam Constitutional:      Appearance: Normal appearance.  Cardiovascular:     Rate and Rhythm: Normal rate and regular rhythm.     Pulses: Normal pulses.  Pulmonary:     Effort: Pulmonary effort is normal.     Breath sounds: Normal breath sounds.  Musculoskeletal:     Right lower leg: No edema.     Left lower leg: No edema.  Neurological:     Mental Status: She is alert.      Data: Labs, imaging, and micro were reviewed in Epic. Refer to Assessment and Plan below for full details in Problem-Based Charting.  Assessment & Plan:  Essential hypertension - Blood pressure is not well controlled today - We spent most of today's visit discussing her medicines. I think HCTZ is a poor choice for her for several reasons - it can dehydrate her, her calcium has been elevated on multiple checks, and there are other better medicines for her heart. She has also been taking  potassium supplements, which are large pills that sometimes get stuck in her throat. Together with her and her daughter, we decided to stop HCTZ and K supplements, and start spironolactone 73m daily. I anticipate that she will need a higher dose of spironolactone, and probably an SGLT2 as well, but I want to make just 1 change at a time.  - BMP today. If potassium is borderline high, will plan for repeat BMP in 1-2 weeks  - Follow up in 4 weeks for blood pressure check. Anticipate increasing spiro and/or starting SGLT2 then  Type II diabetes mellitus (HCC) - A1C 6.7 today, at goal of <8 - Not on any medicines - She had her eye exam  yesterday, they will send over records  Hypercalcemia - I suspect this is related to HCTZ, which we are stopping today. Repeat BMP to ensure resolution, and get PTH if not improved off of HCTZ  (HFpEF) heart failure with preserved ejection fraction ([place] - She appears euvolemic today, not on any diuretics - Stop HCTZ today, start Spironolactone 2546mdaily. Anticipate starting SGLT2 at next visit   Atrial flutter ([place]- Heart rate and rhythm were normal on my exam today, but I did notice that her HR went from 83 on first check to 50 on repeat vital signs. Maybe just in the setting of resting, but will plan for careful heart exam at each visit, with low threshold for EKG to ensure she's not back in Aflutter - Continue Eliquis 46m246mID - her HR was previously too low to tolerate beta blocker     Patient will follow up in 4 weeks, possibly sooner for lab only pending BMP today  JulLottie MusselD

## 2022-12-06 ENCOUNTER — Ambulatory Visit (INDEPENDENT_AMBULATORY_CARE_PROVIDER_SITE_OTHER): Payer: Medicare Other | Admitting: Internal Medicine

## 2022-12-06 ENCOUNTER — Encounter: Payer: Self-pay | Admitting: Internal Medicine

## 2022-12-06 VITALS — BP 163/85 | HR 50 | Temp 97.8°F | Ht 64.0 in | Wt 184.0 lb

## 2022-12-06 DIAGNOSIS — I110 Hypertensive heart disease with heart failure: Secondary | ICD-10-CM | POA: Diagnosis not present

## 2022-12-06 DIAGNOSIS — I5032 Chronic diastolic (congestive) heart failure: Secondary | ICD-10-CM

## 2022-12-06 DIAGNOSIS — I484 Atypical atrial flutter: Secondary | ICD-10-CM

## 2022-12-06 DIAGNOSIS — E119 Type 2 diabetes mellitus without complications: Secondary | ICD-10-CM

## 2022-12-06 DIAGNOSIS — I10 Essential (primary) hypertension: Secondary | ICD-10-CM

## 2022-12-06 LAB — POCT GLYCOSYLATED HEMOGLOBIN (HGB A1C): Hemoglobin A1C: 6.7 % — AB (ref 4.0–5.6)

## 2022-12-06 LAB — GLUCOSE, CAPILLARY: Glucose-Capillary: 114 mg/dL — ABNORMAL HIGH (ref 70–99)

## 2022-12-06 MED ORDER — SPIRONOLACTONE 25 MG PO TABS: 25.0000 mg | ORAL_TABLET | Freq: Every day | ORAL | 3 refills | Status: DC

## 2022-12-06 MED ORDER — LOSARTAN POTASSIUM 100 MG PO TABS: 100.0000 mg | ORAL_TABLET | Freq: Every day | ORAL | 3 refills | Status: DC

## 2022-12-06 MED ORDER — AMLODIPINE BESYLATE 10 MG PO TABS: 10.0000 mg | ORAL_TABLET | Freq: Every day | ORAL | 3 refills | Status: DC

## 2022-12-06 MED ORDER — ROSUVASTATIN CALCIUM 10 MG PO TABS: 10.0000 mg | ORAL_TABLET | ORAL | 3 refills | Status: DC

## 2022-12-06 NOTE — Assessment & Plan Note (Signed)
-   Heart rate and rhythm were normal on my exam today, but I did notice that her HR went from 83 on first check to 50 on repeat vital signs. Maybe just in the setting of resting, but will plan for careful heart exam at each visit, with low threshold for EKG to ensure she's not back in Aflutter - Continue Eliquis 5mg  BID - her HR was previously too low to tolerate beta blocker

## 2022-12-06 NOTE — Patient Instructions (Signed)
Dear [name],  It was a pleasure seeing you in clinic today.   Your A1C looked excellent at 6.7 today.   For your blood pressure and heart failure, we have made some changes: STOP taking your potassium and hydrochlorothiazide (HCTZ) Start taking spironolactone 22m daily. I have sent this to your pharmacy  Return to clinic in 4 weeks for a follow-up appointment with me. We may need to add another medicine for your blood pressure and heart at that time.   I checked blood work today, and I will call you with those results. If you blood work is abnormal, I may need you to come back for labs only sooner than 4 weeks. That would be a quick visit, just getting labs checked, not needing to see me for a full appointment.    Sincerely, Dr. JLottie Mussel

## 2022-12-06 NOTE — Assessment & Plan Note (Signed)
-   A1C 6.7 today, at goal of <8 - Not on any medicines - She had her eye exam yesterday, they will send over records

## 2022-12-06 NOTE — Assessment & Plan Note (Signed)
-   Blood pressure is not well controlled today - We spent most of today's visit discussing her medicines. I think HCTZ is a poor choice for her for several reasons - it can dehydrate her, her calcium has been elevated on multiple checks, and there are other better medicines for her heart. She has also been taking potassium supplements, which are large pills that sometimes get stuck in her throat. Together with her and her daughter, we decided to stop HCTZ and K supplements, and start spironolactone 25mg  daily. I anticipate that she will need a higher dose of spironolactone, and probably an SGLT2 as well, but I want to make just 1 change at a time.  - BMP today. If potassium is borderline high, will plan for repeat BMP in 1-2 weeks  - Follow up in 4 weeks for blood pressure check. Anticipate increasing spiro and/or starting SGLT2 then

## 2022-12-06 NOTE — Assessment & Plan Note (Signed)
-   I suspect this is related to HCTZ, which we are stopping today. Repeat BMP to ensure resolution, and get PTH if not improved off of HCTZ

## 2022-12-06 NOTE — Assessment & Plan Note (Signed)
>>  ASSESSMENT AND PLAN FOR (HFPEF) HEART FAILURE WITH PRESERVED EJECTION FRACTION (HCC) WRITTEN ON 12/06/2022 11:28 AM BY MACHEN, JULIE, MD  - She appears euvolemic today, not on any diuretics - Stop HCTZ today, start Spironolactone 25mg  daily. Anticipate starting SGLT2 at next visit

## 2022-12-06 NOTE — Assessment & Plan Note (Signed)
-   She appears euvolemic today, not on any diuretics - Stop HCTZ today, start Spironolactone 25mg  daily. Anticipate starting SGLT2 at next visit

## 2022-12-08 LAB — BMP8+ANION GAP
Anion Gap: 16 mmol/L (ref 10.0–18.0)
BUN/Creatinine Ratio: 16 (ref 12–28)
BUN: 19 mg/dL (ref 8–27)
CO2: 21 mmol/L (ref 20–29)
Calcium: 10 mg/dL (ref 8.7–10.3)
Chloride: 102 mmol/L (ref 96–106)
Creatinine, Ser: 1.21 mg/dL — ABNORMAL HIGH (ref 0.57–1.00)
Glucose: 122 mg/dL — ABNORMAL HIGH (ref 70–99)
Potassium: 4.1 mmol/L (ref 3.5–5.2)
Sodium: 139 mmol/L (ref 134–144)
eGFR: 43 mL/min/{1.73_m2} — ABNORMAL LOW (ref >59)

## 2022-12-12 NOTE — Progress Notes (Signed)
Labs reviewed. Cr stable at 1.21. Potassium normal. She's tolerating new medicine well. Since she's on ARB + Spironolactone, I would like to check a potassium before our 3/20 visit. I've put in orders for BMP, and she will come in next week or the following week to get that checked.  I called her to review this, answered all questions.

## 2022-12-12 NOTE — Addendum Note (Signed)
Addended by: Randalyn Rhea on: 12/12/2022 08:48 AM   Modules accepted: Orders

## 2022-12-25 ENCOUNTER — Other Ambulatory Visit (INDEPENDENT_AMBULATORY_CARE_PROVIDER_SITE_OTHER): Payer: Medicare Other

## 2022-12-25 ENCOUNTER — Telehealth: Payer: Self-pay

## 2022-12-25 DIAGNOSIS — I10 Essential (primary) hypertension: Secondary | ICD-10-CM

## 2022-12-25 NOTE — Telephone Encounter (Signed)
Patient came in this morning for a lab only visit, patient wanted her blood pressure checked. Her blood pressure was 166/64 pulse 54

## 2022-12-27 LAB — BMP8+ANION GAP
Anion Gap: 17 mmol/L (ref 10.0–18.0)
BUN/Creatinine Ratio: 19 (ref 12–28)
BUN: 27 mg/dL (ref 8–27)
CO2: 20 mmol/L (ref 20–29)
Calcium: 10 mg/dL (ref 8.7–10.3)
Chloride: 104 mmol/L (ref 96–106)
Creatinine, Ser: 1.39 mg/dL — ABNORMAL HIGH (ref 0.57–1.00)
Glucose: 143 mg/dL — ABNORMAL HIGH (ref 70–99)
Potassium: 4.7 mmol/L (ref 3.5–5.2)
Sodium: 141 mmol/L (ref 134–144)
eGFR: 36 mL/min/{1.73_m2} — ABNORMAL LOW (ref >59)

## 2022-12-28 NOTE — Progress Notes (Signed)
Called patient to review lab results. Cr bumped up slightly to 1.39 from 1.21, but I am not concerned about this change.  If blood pressure is still elevated at our upcoming 3/20 appointment, then I will repeat BMP and either increase spironolactone or add Jardiance

## 2023-01-08 ENCOUNTER — Encounter: Payer: Self-pay | Admitting: Dietician

## 2023-01-08 ENCOUNTER — Encounter: Payer: Self-pay | Admitting: Cardiovascular Disease

## 2023-01-08 ENCOUNTER — Ambulatory Visit: Payer: Medicare Other | Attending: Cardiovascular Disease | Admitting: Cardiovascular Disease

## 2023-01-08 VITALS — BP 130/68 | HR 53 | Ht 64.0 in | Wt 178.2 lb

## 2023-01-08 DIAGNOSIS — I484 Atypical atrial flutter: Secondary | ICD-10-CM

## 2023-01-08 DIAGNOSIS — Z952 Presence of prosthetic heart valve: Secondary | ICD-10-CM

## 2023-01-08 DIAGNOSIS — I5032 Chronic diastolic (congestive) heart failure: Secondary | ICD-10-CM

## 2023-01-08 DIAGNOSIS — I447 Left bundle-branch block, unspecified: Secondary | ICD-10-CM | POA: Diagnosis not present

## 2023-01-08 DIAGNOSIS — I10 Essential (primary) hypertension: Secondary | ICD-10-CM

## 2023-01-08 NOTE — Patient Instructions (Signed)
Medication Instructions:  Your physician recommends that you continue on your current medications as directed. Please refer to the Current Medication list given to you today.  *If you need a refill on your cardiac medications before your next appointment, please call your pharmacy*  Lab Work: NONE If you have labs (blood work) drawn today and your tests are completely normal, you will receive your results only by: [place] (if you have MyChart) OR A paper copy in the mail If you have any lab test that is abnormal or we need to change your treatment, we will call you to review the results.  Testing/Procedures: ECHO (prior to next appt in 6 months) Your physician has requested that you have an echocardiogram. Echocardiography is a painless test that uses sound waves to create images of your heart. It provides your doctor with information about the size and shape of your heart and how well your heart's chambers and valves are working. This procedure takes approximately one hour. There are no restrictions for this procedure. Please do NOT wear cologne, perfume, aftershave, or lotions (deodorant is allowed). Please arrive 15 minutes prior to your appointment time.  Follow-Up: At [facility], you and your health needs are our priority.  As part of our continuing mission to provide you with exceptional heart care, we have created designated Provider Care Teams.  These Care Teams include your primary Cardiologist (physician) and Advanced Practice Providers (APPs -  Physician Assistants and Nurse Practitioners) who all work together to provide you with the care you need, when you need it.  We recommend signing up for the patient portal called "MyChart".  Sign up information is provided on this After Visit Summary.  MyChart is used to connect with patients for Virtual Visits (Telemedicine).  Patients are able to view lab/test results, encounter notes, upcoming appointments, etc.   Non-urgent messages can be sent to your provider as well.   To learn more about what you can do with MyChart, go to [email or web address].    Your next appointment:   6 month(s)  Provider:   Ambrose Pancoast, NP or Richardson Dopp, PA-C     Then, Sherren Mocha, MD will plan to see you again in 1 year(s).
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
## 2023-01-09 NOTE — Progress Notes (Unsigned)
[facility]: Clinic Note  Subjective:  History of Present Illness: [name] is a 89 y.o. year old female who presents for 1 month follow up.  At last visit, we stopped HCTZ and K supplements, started spironoalctone 25mg  daily. She brought all meds with her today and is adherent to them, took them this morning. Blood pressure was 163/85 at her lab check, then 130/68 at cardiology visit on Monday, then 174/60 today here in the office.  She is having some occasional dizziness when she walks to the mailbox. She feels lightheaded, and this improves after she sits down. No chest pain, dyspnea, or palpitations. No syncope or falls. The dizziness/lightheadedness has only happened when she's walking around.     Please refer to Assessment and Plan below for full details in Problem-Based Charting.   Past Medical History:  Patient Active Problem List   Diagnosis Date Noted   Hypercalcemia 05/17/2022   Secondary hypercoagulable state ([place]) 04/28/2022   CAD (coronary artery disease) 04/05/2022   Bradycardia    Atrial flutter (HCC)    (HFpEF) heart failure with preserved ejection fraction ([place]) 03/19/2022   Stage 3a chronic kidney disease (CKD) ([place]) 03/17/2022   Hypothyroidism 03/17/2022   Severe aortic stenosis s/p TAVR 04/22/2019   Essential hypertension 11/19/2013   Type II diabetes mellitus ([place]) 03/05/2013   Hyperlipidemia     Medications:  Current Outpatient Medications:    empagliflozin (JARDIANCE) 10 MG TABS tablet, Take 1 tablet (10 mg total) by mouth daily before breakfast., Disp: 90 tablet, Rfl: 3   acetaminophen (TYLENOL) 500 MG tablet, Take 500 mg by mouth every 6 (six) hours as needed for mild pain, moderate pain or headache. , Disp: , Rfl:    amLODipine (NORVASC) 10 MG tablet, Take 1 tablet (10 mg total) by mouth daily., Disp: 90 tablet, Rfl: 3   apixaban (ELIQUIS) 5 MG TABS tablet, Take 1 tablet (5 mg total) by mouth 2 (two) times daily.  Overdue for follow-up, MUST see MD for FUTURE refills., Disp: 180 tablet, Rfl: 3   latanoprost (XALATAN) 0.005 % ophthalmic solution, Place 1 drop into both eyes at bedtime., Disp: , Rfl:    levothyroxine (SYNTHROID) 100 MCG tablet, Take 1 tablet (100 mcg total) by mouth daily., Disp: 90 tablet, Rfl: 3   losartan (COZAAR) 100 MG tablet, Take 1 tablet (100 mg total) by mouth daily., Disp: 90 tablet, Rfl: 3   rosuvastatin (CRESTOR) 10 MG tablet, Take 1 tablet (10 mg total) by mouth every other day., Disp: 45 tablet, Rfl: 3   spironolactone (ALDACTONE) 25 MG tablet, Take 1 tablet (25 mg total) by mouth daily., Disp: 90 tablet, Rfl: 3   Allergies: Allergies  Allergen Reactions   Lisinopril     Angioneurotic edema     Objective:   Vitals: Vitals:   01/10/23 0832 01/10/23 0905  BP: (!) 174/60 (!) 171/60  Pulse: (!) 52 (!) 42  Temp: 97.6 F (36.4 C)   SpO2: 100%     Physical Exam: Physical Exam Constitutional:      Appearance: Normal appearance.  HENT:     Head: Normocephalic and atraumatic.  Cardiovascular:     Comments: Bradycardic, regular rhythm Pulmonary:     Effort: Pulmonary effort is normal. No respiratory distress.  Neurological:     Mental Status: She is alert.      Data: Labs, imaging, and micro were reviewed in Epic. Refer to Assessment and Plan below for full details in Problem-Based Charting.  Assessment & Plan:  Essential hypertension - Continue Amlodipine 10mg  daily, Losartan 100mg  daily, and Spironolactone 25mg  daily. - HCTZ/diuretics are not a good choice for her because I think they were dehydrating her - Beta blockers are not a good choice for her because her HR runs in the 50s - I want to be very careful not to drop her blood pressure too low, especially since her diastolic blood pressures have been in the 60s and she's having some dizziness when walking to the mailbox. Orthostatics are negative today - Start Jardiance 10mg  daily   (HFpEF) heart  failure with preserved ejection fraction ([place]) - She is euvolemic today - Continue Spiro 25mg  daily - Add Jardiance 10mg  daily  - She follows with cardiology, next appointment in 06/2023 with TTE before  Stage 3a chronic kidney disease (CKD) ([place]) - Starting Jardiance 10mg  daily today  Type II diabetes mellitus ([place]) - Starting Jardiance 10mg  daily today  Bradycardia - Patient has chronic bradycardia with HR in 40s-50s. She has not passed out. She is not on beta blockers. Today, she is reporting some dizziness when she walks to her mailbox occasionally. Not associated with palpitations, and resolves when she sits down for a few minutes. I checked orthostatics today, and they were negative (BP remained 170s/60s), but her HR was int he 40s during this check - she was asymptomatic - I encouraged her to buy a pulse ox to check her HR at home, especially when she is feeling dizzy on these rare episodes. If HR is <50 when she's feeling dizzy, she will let me and her Cardiologist know. I don't think she's having symptomatic bradycardia, but I have arranged close follow up in 4 weeks to see if her symptoms have improved     Patient will follow up in 4 weeks  Lottie Mussel, MD

## 2023-01-10 ENCOUNTER — Encounter: Payer: Self-pay | Admitting: Internal Medicine

## 2023-01-10 ENCOUNTER — Ambulatory Visit (INDEPENDENT_AMBULATORY_CARE_PROVIDER_SITE_OTHER): Payer: Medicare Other | Admitting: Internal Medicine

## 2023-01-10 VITALS — BP 177/67 | HR 53 | Temp 97.6°F | Ht 64.0 in | Wt 179.2 lb

## 2023-01-10 DIAGNOSIS — N1831 Chronic kidney disease, stage 3a: Secondary | ICD-10-CM

## 2023-01-10 DIAGNOSIS — R001 Bradycardia, unspecified: Secondary | ICD-10-CM

## 2023-01-10 DIAGNOSIS — E119 Type 2 diabetes mellitus without complications: Secondary | ICD-10-CM | POA: Diagnosis not present

## 2023-01-10 DIAGNOSIS — I130 Hypertensive heart and chronic kidney disease with heart failure and stage 1 through stage 4 chronic kidney disease, or unspecified chronic kidney disease: Secondary | ICD-10-CM | POA: Diagnosis not present

## 2023-01-10 DIAGNOSIS — I10 Essential (primary) hypertension: Secondary | ICD-10-CM

## 2023-01-10 DIAGNOSIS — I5032 Chronic diastolic (congestive) heart failure: Secondary | ICD-10-CM | POA: Diagnosis not present

## 2023-01-10 MED ORDER — EMPAGLIFLOZIN 10 MG PO TABS: 10.0000 mg | ORAL_TABLET | Freq: Every day | ORAL | 3 refills | Status: DC

## 2023-01-10 NOTE — Assessment & Plan Note (Signed)
-   Starting Jardiance 10mg  daily today

## 2023-01-10 NOTE — Assessment & Plan Note (Signed)
>>  ASSESSMENT AND PLAN FOR BRADYCARDIA WRITTEN ON 01/10/2023  9:16 AM BY Krisalyn Yankowski, MD  - Patient has chronic bradycardia with HR in 40s-50s. She has not passed out. She is not on beta blockers. Today, she is reporting some dizziness when she walks to her mailbox occasionally. Not associated with palpitations, and resolves when she sits down for a few minutes. I checked orthostatics today, and they were negative (BP remained 170s/60s), but her HR was int he 40s during this check - she was asymptomatic - I encouraged her to buy a pulse ox to check her HR at home, especially when she is feeling dizzy on these rare episodes. If HR is <50 when she's feeling dizzy, she will let me and her Cardiologist know. I don't think she's having symptomatic bradycardia, but I have arranged close follow up in 4 weeks to see if her symptoms have improved

## 2023-01-10 NOTE — Assessment & Plan Note (Signed)
-   Patient has chronic bradycardia with HR in 40s-50s. She has not passed out. She is not on beta blockers. Today, she is reporting some dizziness when she walks to her mailbox occasionally. Not associated with palpitations, and resolves when she sits down for a few minutes. I checked orthostatics today, and they were negative (BP remained 170s/60s), but her HR was int he 40s during this check - she was asymptomatic - I encouraged her to buy a pulse ox to check her HR at home, especially when she is feeling dizzy on these rare episodes. If HR is <50 when she's feeling dizzy, she will let me and her Cardiologist know. I don't think she's having symptomatic bradycardia, but I have arranged close follow up in 4 weeks to see if her symptoms have improved

## 2023-01-10 NOTE — Assessment & Plan Note (Signed)
-   Continue Amlodipine 10mg  daily, Losartan 100mg  daily, and Spironolactone 25mg  daily. - HCTZ/diuretics are not a good choice for her because I think they were dehydrating her - Beta blockers are not a good choice for her because her HR runs in the 50s - I want to be very careful not to drop her blood pressure too low, especially since her diastolic blood pressures have been in the 60s and she's having some dizziness when walking to the mailbox. Orthostatics are negative today - Start Jardiance 10mg  daily

## 2023-01-10 NOTE — Patient Instructions (Addendum)
Dear Ms [name],  It was a pleasure to see you in clinic today.  We made 1 small change to your medicines. I'm starting you on a medicine called Jardiance. You will take 1 pill (10mg ) once daily. This medicine is good for your heart, your kidneys, and your diabetes - it also might help lower your blood pressure just a little bit. I want to be careful not to lower your blood pressure too much though - I don't want you getting dizzy or having falls.  I'll see you back in clinic in 1 month. At that visit, we will recheck your blood pressure.    Sincerely, Dr. Lottie Mussel

## 2023-01-10 NOTE — Assessment & Plan Note (Signed)
-   She is euvolemic today - Continue Spiro 25mg  daily - Add Jardiance 10mg  daily  - She follows with cardiology, next appointment in 06/2023 with TTE before

## 2023-01-10 NOTE — Assessment & Plan Note (Signed)
>>  ASSESSMENT AND PLAN FOR (HFPEF) HEART FAILURE WITH PRESERVED EJECTION FRACTION (HCC) WRITTEN ON 01/10/2023  9:00 AM BY MACHEN, JULIE, MD  - She is euvolemic today - Continue Spiro 25mg  daily - Add Jardiance 10mg  daily  - She follows with cardiology, next appointment in 06/2023 with TTE before

## 2023-01-11 LAB — BMP8+ANION GAP
Anion Gap: 17 mmol/L (ref 10.0–18.0)
BUN/Creatinine Ratio: 21 (ref 12–28)
BUN: 28 mg/dL — ABNORMAL HIGH (ref 8–27)
CO2: 21 mmol/L (ref 20–29)
Calcium: 10.6 mg/dL — ABNORMAL HIGH (ref 8.7–10.3)
Chloride: 102 mmol/L (ref 96–106)
Creatinine, Ser: 1.31 mg/dL — ABNORMAL HIGH (ref 0.57–1.00)
Glucose: 123 mg/dL — ABNORMAL HIGH (ref 70–99)
Potassium: 4.7 mmol/L (ref 3.5–5.2)
Sodium: 140 mmol/L (ref 134–144)
eGFR: 39 mL/min/{1.73_m2} — ABNORMAL LOW (ref >59)

## 2023-01-11 NOTE — Progress Notes (Signed)
I have reviewed your labs and they look stable.   Your calcium tends to run high, so we will need to repeat this at your next visit.

## 2023-02-12 NOTE — Progress Notes (Unsigned)
[facility]: Clinic Note  Subjective:  History of Present Illness: [name] is a 89 y.o. year old female who presents for 1 month follow up. I saw her last in March.  # HTN, HFpEF, ckd3  - amlo 10, losartan 100, spiro 25 - hctz/diuiretics dehydrate her - HR runs too low for BB - started jardiance  daily last time   Talk about shingrix  Can space out visits - 4 mo  Please refer to Assessment and Plan below for full details in Problem-Based Charting.   Past Medical History:  Patient Active Problem List   Diagnosis Date Noted   Hypercalcemia 05/17/2022   Secondary hypercoagulable state 04/28/2022   CAD (coronary artery disease) 04/05/2022   Bradycardia    Atrial flutter    (HFpEF) heart failure with preserved ejection fraction 03/19/2022   Stage 3a chronic kidney disease (CKD) 03/17/2022   Hypothyroidism 03/17/2022   Severe aortic stenosis s/p TAVR 04/22/2019   Essential hypertension 11/19/2013   Type II diabetes mellitus 03/05/2013   Hyperlipidemia     Social History: Reviewed in Epic. Pertinent updates today include: ***  Family History: Reviewed in Epic. Pertinent updates today include: None  Medications:  Current Outpatient Medications:    acetaminophen (TYLENOL) 500 MG tablet, Take 500 mg by mouth every 6 (six) hours as needed for mild pain, moderate pain or headache. , Disp: , Rfl:    amLODipine (NORVASC) 10 MG tablet, Take 1 tablet (10 mg total) by mouth daily., Disp: 90 tablet, Rfl: 3   apixaban (ELIQUIS) 5 MG TABS tablet, Take 1 tablet (5 mg total) by mouth 2 (two) times daily. Overdue for follow-up, MUST see MD for FUTURE refills., Disp: 180 tablet, Rfl: 3   empagliflozin (JARDIANCE) 10 MG TABS tablet, Take 1 tablet (10 mg total) by mouth daily before breakfast., Disp: 90 tablet, Rfl: 3   latanoprost (XALATAN) 0.005 % ophthalmic solution, Place 1 drop into both eyes at bedtime., Disp: , Rfl:    levothyroxine (SYNTHROID) 100  MCG tablet, Take 1 tablet (100 mcg total) by mouth daily., Disp: 90 tablet, Rfl: 3   losartan (COZAAR) 100 MG tablet, Take 1 tablet (100 mg total) by mouth daily., Disp: 90 tablet, Rfl: 3   rosuvastatin (CRESTOR) 10 MG tablet, Take 1 tablet (10 mg total) by mouth every other day., Disp: 45 tablet, Rfl: 3   spironolactone (ALDACTONE) 25 MG tablet, Take 1 tablet (25 mg total) by mouth daily., Disp: 90 tablet, Rfl: 3   Allergies: Allergies  Allergen Reactions   Lisinopril     Angioneurotic edema    Review of Systems: ROS   Objective:   Vitals: There were no vitals filed for this visit.  Physical Exam: Physical Exam   Data: Labs, imaging, and micro were reviewed in Epic. Refer to Assessment and Plan below for full details in Problem-Based Charting.  Assessment & Plan:  No problem-specific Assessment & Plan notes found for this encounter.     Patient will follow up in ***  Mercie Eon, MD

## 2023-02-14 ENCOUNTER — Ambulatory Visit (INDEPENDENT_AMBULATORY_CARE_PROVIDER_SITE_OTHER): Payer: Medicare Other | Admitting: Internal Medicine

## 2023-02-14 ENCOUNTER — Encounter: Payer: Self-pay | Admitting: Internal Medicine

## 2023-02-14 VITALS — BP 145/54 | HR 50 | Temp 97.7°F | Ht 64.0 in | Wt 178.5 lb

## 2023-02-14 DIAGNOSIS — I5032 Chronic diastolic (congestive) heart failure: Secondary | ICD-10-CM

## 2023-02-14 DIAGNOSIS — N1831 Chronic kidney disease, stage 3a: Secondary | ICD-10-CM

## 2023-02-14 DIAGNOSIS — I130 Hypertensive heart and chronic kidney disease with heart failure and stage 1 through stage 4 chronic kidney disease, or unspecified chronic kidney disease: Secondary | ICD-10-CM | POA: Diagnosis not present

## 2023-02-14 DIAGNOSIS — I10 Essential (primary) hypertension: Secondary | ICD-10-CM

## 2023-02-14 DIAGNOSIS — Z0000 Encounter for general adult medical examination without abnormal findings: Secondary | ICD-10-CM | POA: Insufficient documentation

## 2023-02-14 MED ORDER — LEVOTHYROXINE SODIUM 100 MCG PO TABS: 100.0000 ug | ORAL_TABLET | Freq: Every day | ORAL | 3 refills | Status: DC

## 2023-02-14 NOTE — Assessment & Plan Note (Signed)
-   Chronic and stable, she is euvolemic today - Follows with Cardiology, scheduled 06/2023 - Continue Cleda Daub  daily, Jardiance  daily

## 2023-02-14 NOTE — Assessment & Plan Note (Signed)
-   Continue Amlodipine  daily, Losartan  daily, and Spironolactone  daily - No HCTZ/diuretics because they dehydrate her - no beta blockers due to her bradycardia - She has a wide pulse pressure, so I anticipate her systolic blood pressures will run in the 140s, and that is okay in this 89yo woman. I don't want to drop her diastolics too low, which was making her dizzy. This regimen seems to be working well for her. Continue current plan - BMP today

## 2023-02-14 NOTE — Assessment & Plan Note (Signed)
>>  ASSESSMENT AND PLAN FOR (HFPEF) HEART FAILURE WITH PRESERVED EJECTION FRACTION (HCC) WRITTEN ON 02/14/2023 10:09 AM BY MACHEN, JULIE, MD  - Chronic and stable, she is euvolemic today - Follows with Cardiology, scheduled 06/2023 - Continue Spiro 25mg  daily, Jardiance 10mg  daily

## 2023-02-14 NOTE — Assessment & Plan Note (Signed)
-   Continue Jardiance  daily.  - BMP today - Can consider increasing Jardiance to  daily depending on labs, but she seems to be at a good place today

## 2023-02-14 NOTE — Assessment & Plan Note (Signed)
-   Patient and daughter are sure that she had 2 shingrix vaccines, so I updated this in the chart

## 2023-02-14 NOTE — Patient Instructions (Addendum)
Dear Ms Pothier,  It was a pleasure seeing you in clinic.  I am checking your labs today and will call you with the results. Keep taking your medicines as you are now. When I call you, I will let you know if I'm going to increase the dose of your Jardiance.  I'll see you back in 4 months (August). I don't have my schedule for August yet, but call back in May or June to schedule your next appointment.  Enjoy your beach vacation!   Sincerely, Dr. Mercie Eon

## 2023-02-14 NOTE — Assessment & Plan Note (Signed)
-   calcium remained high even off HCTZ, so we will repeat BMP today and check PTH

## 2023-02-15 LAB — BMP8+ANION GAP
Anion Gap: 17 mmol/L (ref 10.0–18.0)
BUN/Creatinine Ratio: 21 (ref 12–28)
BUN: 29 mg/dL — ABNORMAL HIGH (ref 8–27)
CO2: 20 mmol/L (ref 20–29)
Calcium: 10.2 mg/dL (ref 8.7–10.3)
Chloride: 100 mmol/L (ref 96–106)
Creatinine, Ser: 1.37 mg/dL — ABNORMAL HIGH (ref 0.57–1.00)
Glucose: 111 mg/dL — ABNORMAL HIGH (ref 70–99)
Potassium: 4.8 mmol/L (ref 3.5–5.2)
Sodium: 137 mmol/L (ref 134–144)
eGFR: 37 mL/min/{1.73_m2} — ABNORMAL LOW (ref >59)

## 2023-02-15 LAB — PARATHYROID HORMONE, INTACT (NO CA): PTH: 27 pg/mL (ref 15–65)

## 2023-02-20 MED ORDER — EMPAGLIFLOZIN 25 MG PO TABS: 25.0000 mg | ORAL_TABLET | Freq: Every day | ORAL | 3 refills | Status: DC

## 2023-02-20 NOTE — Addendum Note (Signed)
Addended by: Derrek Monaco on: 02/20/2023 09:03 AM   Modules accepted: Orders

## 2023-02-20 NOTE — Progress Notes (Signed)
Called patient to review labs.Kidney function is stable. We will increase Jardiance from 10 to 25mg  daily, as we discussed at our visit. Patient will keep taking 10mg  until she runs out of her current bottle, and then pick up the 25mg  from her pharmacy.  Hypercalcemia has resolved and PTH is normal - I think this was from the HCTZ.

## 2023-03-21 ENCOUNTER — Telehealth: Payer: Self-pay

## 2023-03-21 NOTE — Telephone Encounter (Signed)
Error

## 2023-05-17 ENCOUNTER — Other Ambulatory Visit: Payer: Self-pay

## 2023-05-17 MED ORDER — APIXABAN 5 MG PO TABS: 5.0000 mg | ORAL_TABLET | Freq: Two times a day (BID) | ORAL | 3 refills | Status: DC

## 2023-06-09 ENCOUNTER — Inpatient Hospital Stay (HOSPITAL_COMMUNITY)
Admission: EM | Admit: 2023-06-09 | Discharge: 2023-06-11 | DRG: 243 | Disposition: A | Discharge status: Home / Self Care | Payer: Medicare Other | Attending: Internal Medicine | Admitting: Internal Medicine

## 2023-06-09 ENCOUNTER — Other Ambulatory Visit: Payer: Self-pay

## 2023-06-09 ENCOUNTER — Emergency Department (HOSPITAL_COMMUNITY): Payer: Medicare Other

## 2023-06-09 ENCOUNTER — Inpatient Hospital Stay (HOSPITAL_COMMUNITY): Payer: Medicare Other

## 2023-06-09 ENCOUNTER — Encounter (HOSPITAL_COMMUNITY)
Admission: EM | Disposition: A | Discharge status: Home / Self Care | Payer: Self-pay | Source: Home / Self Care | Attending: Internal Medicine

## 2023-06-09 ENCOUNTER — Encounter (HOSPITAL_COMMUNITY): Payer: Self-pay

## 2023-06-09 DIAGNOSIS — I4811 Longstanding persistent atrial fibrillation: Secondary | ICD-10-CM | POA: Diagnosis not present

## 2023-06-09 DIAGNOSIS — Z953 Presence of xenogenic heart valve: Secondary | ICD-10-CM | POA: Diagnosis not present

## 2023-06-09 DIAGNOSIS — Z79890 Hormone replacement therapy (postmenopausal): Secondary | ICD-10-CM | POA: Diagnosis not present

## 2023-06-09 DIAGNOSIS — I2510 Atherosclerotic heart disease of native coronary artery without angina pectoris: Secondary | ICD-10-CM | POA: Diagnosis present

## 2023-06-09 DIAGNOSIS — E039 Hypothyroidism, unspecified: Secondary | ICD-10-CM | POA: Diagnosis present

## 2023-06-09 DIAGNOSIS — E119 Type 2 diabetes mellitus without complications: Secondary | ICD-10-CM | POA: Diagnosis present

## 2023-06-09 DIAGNOSIS — I442 Atrioventricular block, complete: Secondary | ICD-10-CM | POA: Diagnosis not present

## 2023-06-09 DIAGNOSIS — R55 Syncope and collapse: Secondary | ICD-10-CM | POA: Diagnosis present

## 2023-06-09 DIAGNOSIS — Z7984 Long term (current) use of oral hypoglycemic drugs: Secondary | ICD-10-CM

## 2023-06-09 DIAGNOSIS — Z1152 Encounter for screening for COVID-19: Secondary | ICD-10-CM

## 2023-06-09 DIAGNOSIS — I5032 Chronic diastolic (congestive) heart failure: Secondary | ICD-10-CM | POA: Diagnosis present

## 2023-06-09 DIAGNOSIS — I110 Hypertensive heart disease with heart failure: Secondary | ICD-10-CM | POA: Diagnosis present

## 2023-06-09 DIAGNOSIS — I4892 Unspecified atrial flutter: Secondary | ICD-10-CM | POA: Diagnosis not present

## 2023-06-09 DIAGNOSIS — Z888 Allergy status to other drugs, medicaments and biological substances status: Secondary | ICD-10-CM | POA: Diagnosis not present

## 2023-06-09 DIAGNOSIS — Z841 Family history of disorders of kidney and ureter: Secondary | ICD-10-CM | POA: Diagnosis not present

## 2023-06-09 DIAGNOSIS — Z8249 Family history of ischemic heart disease and other diseases of the circulatory system: Secondary | ICD-10-CM | POA: Diagnosis not present

## 2023-06-09 DIAGNOSIS — E785 Hyperlipidemia, unspecified: Secondary | ICD-10-CM | POA: Diagnosis not present

## 2023-06-09 DIAGNOSIS — Z7901 Long term (current) use of anticoagulants: Secondary | ICD-10-CM

## 2023-06-09 DIAGNOSIS — R001 Bradycardia, unspecified: Principal | ICD-10-CM | POA: Diagnosis present

## 2023-06-09 DIAGNOSIS — Z833 Family history of diabetes mellitus: Secondary | ICD-10-CM | POA: Diagnosis not present

## 2023-06-09 DIAGNOSIS — I447 Left bundle-branch block, unspecified: Secondary | ICD-10-CM | POA: Diagnosis present

## 2023-06-09 DIAGNOSIS — Z79899 Other long term (current) drug therapy: Secondary | ICD-10-CM | POA: Diagnosis not present

## 2023-06-09 DIAGNOSIS — Z950 Presence of cardiac pacemaker: Secondary | ICD-10-CM | POA: Insufficient documentation

## 2023-06-09 HISTORY — PX: TEMPORARY PACEMAKER: CATH118268

## 2023-06-09 LAB — COMPREHENSIVE METABOLIC PANEL
ALT: 26 U/L (ref 0–44)
AST: 26 U/L (ref 15–41)
Albumin: 3.4 g/dL — ABNORMAL LOW (ref 3.5–5.0)
Alkaline Phosphatase: 35 U/L — ABNORMAL LOW (ref 38–126)
Anion gap: 9 (ref 5–15)
BUN: 33 mg/dL — ABNORMAL HIGH (ref 8–23)
CO2: 18 mmol/L — ABNORMAL LOW (ref 22–32)
Calcium: 8.3 mg/dL — ABNORMAL LOW (ref 8.9–10.3)
Chloride: 111 mmol/L (ref 98–111)
Creatinine, Ser: 1.37 mg/dL — ABNORMAL HIGH (ref 0.44–1.00)
GFR, Estimated: 37 mL/min — ABNORMAL LOW (ref >60)
Glucose, Bld: 154 mg/dL — ABNORMAL HIGH (ref 70–99)
Potassium: 4.7 mmol/L (ref 3.5–5.1)
Sodium: 138 mmol/L (ref 135–145)
Total Bilirubin: 0.5 mg/dL (ref 0.3–1.2)
Total Protein: 6.2 g/dL — ABNORMAL LOW (ref 6.5–8.1)

## 2023-06-09 LAB — ECHOCARDIOGRAM COMPLETE
AR max vel: 1.96 cm2
AV Area VTI: 1.9 cm2
AV Area mean vel: 1.9 cm2
AV Mean grad: 5 mmHg
AV Peak grad: 10.6 mmHg
Ao pk vel: 1.63 m/s
Height: 64 in
S' Lateral: 3.6 cm
Weight: 2857.16 oz

## 2023-06-09 LAB — MAGNESIUM: Magnesium: 1.9 mg/dL (ref 1.7–2.4)

## 2023-06-09 LAB — CBC WITH DIFFERENTIAL/PLATELET
Abs Immature Granulocytes: 0.01 10*3/uL (ref 0.00–0.07)
Basophils Absolute: 0.1 10*3/uL (ref 0.0–0.1)
Basophils Relative: 1 %
Eosinophils Absolute: 0.2 10*3/uL (ref 0.0–0.5)
Eosinophils Relative: 3 %
HCT: 43.2 % (ref 36.0–46.0)
Hemoglobin: 13.7 g/dL (ref 12.0–15.0)
Immature Granulocytes: 0 %
Lymphocytes Relative: 24 %
Lymphs Abs: 1.2 10*3/uL (ref 0.7–4.0)
MCH: 31 pg (ref 26.0–34.0)
MCHC: 31.7 g/dL (ref 30.0–36.0)
MCV: 97.7 fL (ref 80.0–100.0)
Monocytes Absolute: 0.6 10*3/uL (ref 0.1–1.0)
Monocytes Relative: 11 %
Neutro Abs: 3 10*3/uL (ref 1.7–7.7)
Neutrophils Relative %: 61 %
Platelets: 135 10*3/uL — ABNORMAL LOW (ref 150–400)
RBC: 4.42 MIL/uL (ref 3.87–5.11)
RDW: 13.2 % (ref 11.5–15.5)
WBC: 5.1 10*3/uL (ref 4.0–10.5)
nRBC: 0 % (ref 0.0–0.2)

## 2023-06-09 LAB — GLUCOSE, CAPILLARY
Glucose-Capillary: 105 mg/dL — ABNORMAL HIGH (ref 70–99)
Glucose-Capillary: 99 mg/dL (ref 70–99)
Glucose-Capillary: 84 mg/dL (ref 70–99)

## 2023-06-09 LAB — I-STAT CHEM 8, ED
BUN: 34 mg/dL — ABNORMAL HIGH (ref 8–23)
Calcium, Ion: 1.12 mmol/L — ABNORMAL LOW (ref 1.15–1.40)
Chloride: 112 mmol/L — ABNORMAL HIGH (ref 98–111)
Creatinine, Ser: 1.4 mg/dL — ABNORMAL HIGH (ref 0.44–1.00)
Glucose, Bld: 144 mg/dL — ABNORMAL HIGH (ref 70–99)
HCT: 43 % (ref 36.0–46.0)
Hemoglobin: 14.6 g/dL (ref 12.0–15.0)
Potassium: 4.7 mmol/L (ref 3.5–5.1)
Sodium: 140 mmol/L (ref 135–145)
TCO2: 20 mmol/L — ABNORMAL LOW (ref 22–32)

## 2023-06-09 LAB — PROTIME-INR
INR: 1.2 (ref 0.8–1.2)
Prothrombin Time: 15.5 seconds — ABNORMAL HIGH (ref 11.4–15.2)

## 2023-06-09 LAB — PHOSPHORUS: Phosphorus: 3.9 mg/dL (ref 2.5–4.6)

## 2023-06-09 LAB — MRSA NEXT GEN BY PCR, NASAL: MRSA by PCR Next Gen: NOT DETECTED

## 2023-06-09 LAB — TROPONIN I (HIGH SENSITIVITY)
Troponin I (High Sensitivity): 6 ng/L (ref <18)
Troponin I (High Sensitivity): 6 ng/L (ref <18)

## 2023-06-09 LAB — CBG MONITORING, ED: Glucose-Capillary: 123 mg/dL — ABNORMAL HIGH (ref 70–99)

## 2023-06-09 LAB — BRAIN NATRIURETIC PEPTIDE: B Natriuretic Peptide: 280.3 pg/mL — ABNORMAL HIGH (ref 0.0–100.0)

## 2023-06-09 LAB — SARS CORONAVIRUS 2 BY RT PCR: SARS Coronavirus 2 by RT PCR: NEGATIVE

## 2023-06-09 SURGERY — TEMPORARY PACEMAKER
Anesthesia: LOCAL

## 2023-06-09 MED ORDER — GLUCAGON HCL RDNA (DIAGNOSTIC) 1 MG IJ SOLR
INTRAMUSCULAR | Status: AC
  Administered 2023-06-09: 1 mg
  Filled 2023-06-09: qty 1

## 2023-06-09 MED ORDER — LATANOPROST 0.005 % OP SOLN
1.0000 [drp] | Freq: Every day | OPHTHALMIC | Status: DC
  Administered 2023-06-09 – 2023-06-10 (×2): 1 [drp] via OPHTHALMIC
  Filled 2023-06-09: qty 2.5

## 2023-06-09 MED ORDER — AMLODIPINE BESYLATE 10 MG PO TABS: 10.0000 mg | ORAL_TABLET | Freq: Every day | ORAL | Status: DC

## 2023-06-09 MED ORDER — ATROPINE SULFATE 1 MG/ML IV SOLN
0.5000 mg | Freq: Once | INTRAVENOUS | Status: AC
  Administered 2023-06-09: 0.5 mg via INTRAVENOUS
  Filled 2023-06-09: qty 0.5

## 2023-06-09 MED ORDER — CHLORHEXIDINE GLUCONATE CLOTH 2 % EX PADS
6.0000 | MEDICATED_PAD | Freq: Every day | CUTANEOUS | Status: DC
  Administered 2023-06-10 – 2023-06-11 (×2): 6 via TOPICAL

## 2023-06-09 MED ORDER — FENTANYL CITRATE (PF) 100 MCG/2ML IJ SOLN
INTRAMUSCULAR | Status: AC
  Filled 2023-06-09: qty 2

## 2023-06-09 MED ORDER — MAGNESIUM SULFATE 2 GM/50ML IV SOLN
2.0000 g | Freq: Once | INTRAVENOUS | Status: AC
  Administered 2023-06-09: 2 g via INTRAVENOUS
  Filled 2023-06-09: qty 50

## 2023-06-09 MED ORDER — SODIUM CHLORIDE 0.9 % IV BOLUS
500.0000 mL | Freq: Once | INTRAVENOUS | Status: AC
  Administered 2023-06-09: 500 mL via INTRAVENOUS

## 2023-06-09 MED ORDER — ATROPINE SULFATE 1 MG/10ML IJ SOSY
PREFILLED_SYRINGE | INTRAMUSCULAR | Status: AC | PRN
Start: 2023-06-09 — End: 2023-06-09
  Administered 2023-06-09: .5 mg via INTRAVENOUS

## 2023-06-09 MED ORDER — LOSARTAN POTASSIUM 50 MG PO TABS: 100.0000 mg | ORAL_TABLET | Freq: Every day | ORAL | Status: DC

## 2023-06-09 MED ORDER — ATROPINE SULFATE 1 MG/10ML IJ SOSY
0.5000 mg | PREFILLED_SYRINGE | Freq: Once | INTRAMUSCULAR | Status: DC | PRN
  Administered 2023-06-09 (×2): 0.5 mg via INTRAVENOUS
  Filled 2023-06-09: qty 10

## 2023-06-09 MED ORDER — ACETAMINOPHEN 325 MG PO TABS
650.0000 mg | ORAL_TABLET | ORAL | Status: DC | PRN
  Administered 2023-06-09: 650 mg via ORAL
  Filled 2023-06-09: qty 2

## 2023-06-09 MED ORDER — INSULIN ASPART 100 UNIT/ML IJ SOLN: 0.0000 [IU] | Freq: Three times a day (TID) | INTRAMUSCULAR | Status: DC

## 2023-06-09 MED ORDER — ATROPINE SULFATE 1 MG/10ML IJ SOSY
0.5000 mg | PREFILLED_SYRINGE | Freq: Once | INTRAMUSCULAR | Status: AC
  Administered 2023-06-09: 0.5 mg via INTRAVENOUS

## 2023-06-09 MED ORDER — LIDOCAINE HCL (PF) 1 % IJ SOLN
INTRAMUSCULAR | Status: AC
  Filled 2023-06-09: qty 30

## 2023-06-09 MED ORDER — LOSARTAN POTASSIUM 50 MG PO TABS
100.0000 mg | ORAL_TABLET | ORAL | Status: AC
  Administered 2023-06-09: 100 mg via ORAL
  Filled 2023-06-09: qty 2

## 2023-06-09 MED ORDER — MIDAZOLAM HCL 2 MG/2ML IJ SOLN
INTRAMUSCULAR | Status: AC
  Filled 2023-06-09: qty 2

## 2023-06-09 MED ORDER — SODIUM CHLORIDE 0.9 % IV SOLN
INTRAVENOUS | Status: AC | PRN
  Administered 2023-06-09: 10 mL/h via INTRAVENOUS

## 2023-06-09 MED ORDER — SPIRONOLACTONE 25 MG PO TABS: 25.0000 mg | ORAL_TABLET | Freq: Every day | ORAL | Status: DC

## 2023-06-09 MED ORDER — LIDOCAINE HCL (PF) 1 % IJ SOLN
INTRAMUSCULAR | Status: DC | PRN
  Administered 2023-06-09: 10 mL

## 2023-06-09 MED ORDER — HEPARIN (PORCINE) IN NACL 1000-0.9 UT/500ML-% IV SOLN
INTRAVENOUS | Status: DC | PRN
  Administered 2023-06-09: 500 mL

## 2023-06-09 MED ORDER — INSULIN ASPART 100 UNIT/ML IJ SOLN: 0.0000 [IU] | Freq: Every day | INTRAMUSCULAR | Status: DC

## 2023-06-09 MED ORDER — NITROGLYCERIN 0.4 MG SL SUBL: 0.4000 mg | SUBLINGUAL_TABLET | SUBLINGUAL | Status: DC | PRN

## 2023-06-09 MED ORDER — FENTANYL CITRATE (PF) 100 MCG/2ML IJ SOLN
INTRAMUSCULAR | Status: DC | PRN
  Administered 2023-06-09: 25 ug via INTRAVENOUS

## 2023-06-09 MED ORDER — CALCIUM GLUCONATE 10 % IV SOLN
INTRAVENOUS | Status: AC
  Administered 2023-06-09: 4.65 meq
  Filled 2023-06-09: qty 10

## 2023-06-09 MED ORDER — SPIRONOLACTONE 25 MG PO TABS
25.0000 mg | ORAL_TABLET | Freq: Every day | ORAL | Status: DC
  Administered 2023-06-09 – 2023-06-11 (×3): 25 mg via ORAL
  Filled 2023-06-09 (×3): qty 1

## 2023-06-09 MED ORDER — SODIUM BICARBONATE 8.4 % IV SOLN
50.0000 meq | Freq: Once | INTRAVENOUS | Status: AC
  Administered 2023-06-09: 50 meq via INTRAVENOUS
  Filled 2023-06-09: qty 50

## 2023-06-09 MED ORDER — ROSUVASTATIN CALCIUM 5 MG PO TABS
10.0000 mg | ORAL_TABLET | ORAL | Status: DC
  Administered 2023-06-09 – 2023-06-11 (×2): 10 mg via ORAL
  Filled 2023-06-09 (×2): qty 2

## 2023-06-09 MED ORDER — ONDANSETRON HCL 4 MG/2ML IJ SOLN: 4.0000 mg | Freq: Four times a day (QID) | INTRAMUSCULAR | Status: DC | PRN

## 2023-06-09 MED ORDER — AMLODIPINE BESYLATE 10 MG PO TABS
10.0000 mg | ORAL_TABLET | Freq: Every day | ORAL | Status: DC
  Administered 2023-06-09 – 2023-06-11 (×3): 10 mg via ORAL
  Filled 2023-06-09 (×3): qty 1

## 2023-06-09 MED ORDER — MIDAZOLAM HCL 2 MG/2ML IJ SOLN
INTRAMUSCULAR | Status: DC | PRN
  Administered 2023-06-09: 1 mg via INTRAVENOUS

## 2023-06-09 MED ORDER — SODIUM CHLORIDE 0.9 % IV SOLN: INTRAVENOUS | Status: DC | PRN

## 2023-06-09 MED ORDER — SODIUM CHLORIDE 0.9 % IV SOLN: Freq: Once | INTRAVENOUS | Status: AC

## 2023-06-09 MED ORDER — HEPARIN (PORCINE) 25000 UT/250ML-% IV SOLN
1000.0000 [IU]/h | INTRAVENOUS | Status: DC
  Administered 2023-06-09: 1000 [IU]/h via INTRAVENOUS
  Filled 2023-06-09: qty 250

## 2023-06-09 MED ORDER — LEVOTHYROXINE SODIUM 100 MCG PO TABS
100.0000 ug | ORAL_TABLET | Freq: Every day | ORAL | Status: DC
  Administered 2023-06-09 – 2023-06-11 (×2): 100 ug via ORAL
  Filled 2023-06-09 (×3): qty 1

## 2023-06-09 MED ORDER — ORAL CARE MOUTH RINSE: 15.0000 mL | OROMUCOSAL | Status: DC | PRN

## 2023-06-09 SURGICAL SUPPLY — 8 items
CABLE ADAPT PACING TEMP 12FT (ADAPTER) IMPLANT
ELECT DEFIB PAD ADLT CADENCE (PAD) IMPLANT
PACK CARDIAC CATHETERIZATION (CUSTOM PROCEDURE TRAY) IMPLANT
SHEATH PINNACLE 6F 10CM (SHEATH) IMPLANT
SHEATH PROBE COVER 6X72 (BAG) IMPLANT
WIRE MICRO SET SILHO 5FR 7 (SHEATH) IMPLANT
WIRE MICROINTRODUCER 60CM (WIRE) IMPLANT
WIRE PACING TEMP ST TIP 5 (CATHETERS) IMPLANT

## 2023-06-09 NOTE — H&P (Addendum)
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 admission.   * I certify that at the point of admission it is my clinical judgment that the patient will require inpatient hospital care spanning beyond 2 midnights from the point of admission due to high intensity of service, high risk for further deterioration and high frequency of surveillance required.*   For questions or updates, please contact [facility] Please consult [email or web address] for contact info under     Signed, Azalee Course, [place]  06/09/2023 11:57 AM    ATTENDING ATTESTATION:  After conducting a review of all available clinical information with the care team, interviewing the patient, and performing a physical exam, I agree with the findings and plan  described in this note.   GEN: No acute distress. Somewhat altered HEENT:  MMM, no JVD, no scleral icterus Cardiac: RRR (TC paced now), no murmurs, rubs, or gallops.  Respiratory: Clear to auscultation bilaterally. GI: Soft, nontender, non-distended  MS: No edema; No deformity. 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  The patient developed a syncopal spell earlier this morning.  She was brought to the hospital.  EMS EKGs demonstrated slow atrial flutter with a ventricular rate of less than 30 bpm.  The patient was transcutaneously paced in the emergency department.  After review with the family as the patient is somewhat altered she is referred for right internal jugular temporary pacemaker placement to bridge to a  likely permanent pacemaker.  She is warm and well-perfused and does not seem to be in cardiogenic shock.  Will plan on starting a heparin drip tomorrow in anticipation for permanent pacemaker. Alverda Skeans, MD Pager [phone]

## 2023-06-09 NOTE — Progress Notes (Signed)
Spoke with the patient, she wish to be full code. She is aware that if her heart stops or she stop breathing, we can do defibrillation, intubation, chest compression and ACLS meds

## 2023-06-09 NOTE — ED Triage Notes (Signed)
Pt bib ems from home c/o dizziness and LOC. Pt was fine last night. Upon waking up this morning when she got out the bed she was dizzy and went unconscious. EMS arrival noted pt HR 19, bilateral radial pulses non-palpable, and cold. Pt was applied non-rebreather mask 15L. En route pt was given 2L bolus and a total 1.5 Atropine. Hx Afib (Eliquis) Pt denies chest pain, n/v, or decrease in appetite.   NSR BP 118/62 CBG 192 CO2 19
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
## 2023-06-09 NOTE — Code Documentation (Signed)
Pt started on 500 ml NS bolus

## 2023-06-09 NOTE — ED Provider Notes (Signed)
[place] EMERGENCY DEPARTMENT AT [facility] Provider Note   CSN: [id number] Arrival date & time: 06/09/23  5427     History  Chief Complaint  Patient presents with   Near Syncope    [name] is a 89 y.o. female.  HPI Patient reports that she went to bed feeling well last night.  She was not having any problems with lightheadedness dizziness or near syncope.  This morning she awakened and sat at the edge of her bed.  She reports that she felt dizzy and did not try to stand up immediately.  However, she awakened on the floor and realized that she had passed out.  She was able to contact EMS.  Patient denies that she had onset of chest pain or headache before passing out.  She is anticoagulated and takes her Eliquis regularly twice a day.  Last dose was yesterday evening.  She denies she has a headache or struck her head that she knows of.  On EMS arrival, patient was extremely bradycardic with rates of approximately 19 and pale without palpable radial pulses.  Patient was awake.  EMS placed the patient on nonrebreather oxygen and administered 2 L normal saline and 1 point         Home Medications Prior to Admission medications   Medication Sig Start Date End Date Taking? Authorizing Provider  acetaminophen (TYLENOL) 500 MG tablet Take 500 mg by mouth every 6 (six) hours as needed for mild pain, moderate pain or headache.     [provider]  amLODipine (NORVASC) 10 MG tablet Take 1 tablet (10 mg total) by mouth daily. 12/06/22 12/06/23  Mercie Eon, MD  apixaban (ELIQUIS) 5 MG TABS tablet Take 1 tablet (5 mg total) by mouth 2 (two) times daily. Overdue for follow-up, MUST see MD for FUTURE refills. 05/17/23 05/16/24  Mercie Eon, MD  empagliflozin (JARDIANCE) 25 MG TABS tablet Take 1 tablet (25 mg total) by mouth daily before breakfast. 02/20/23 02/20/24  Mercie Eon, MD  latanoprost (XALATAN) 0.005 % ophthalmic solution Place 1 drop into both eyes at  bedtime. 02/14/21   [provider]  levothyroxine (SYNTHROID) 100 MCG tablet Take 1 tablet (100 mcg total) by mouth daily. 02/14/23 02/14/24  Mercie Eon, MD  losartan (COZAAR) 100 MG tablet Take 1 tablet (100 mg total) by mouth daily. 12/06/22 12/06/23  Mercie Eon, MD  rosuvastatin (CRESTOR) 10 MG tablet Take 1 tablet (10 mg total) by mouth every other day. 12/06/22 12/06/23  Mercie Eon, MD  spironolactone (ALDACTONE) 25 MG tablet Take 1 tablet (25 mg total) by mouth daily. 12/06/22 12/06/23  Mercie Eon, MD      Allergies    Lisinopril    Review of Systems   Review of Systems  Physical Exam Updated Vital Signs BP (!) 75/61   Pulse 74   Temp 97.6 F (36.4 C) (Tympanic)   Resp (!) 23   Ht 5\' 4"  (1.626 m)   Wt 81 kg   LMP  (LMP Unknown)   SpO2 98%   BMI 30.65 kg/m  Physical Exam Constitutional:      Comments: Well-nourished well-developed.  Good physical condition for age.  No respiratory distress.  HENT:     Mouth/Throat:     Pharynx: Oropharynx is clear.  Eyes:     Extraocular Movements: Extraocular movements intact.  Cardiovascular:     Rate and Rhythm: Bradycardia present. Rhythm irregular.  Pulmonary:     Effort: Pulmonary effort is normal.  Breath sounds: Normal breath sounds.  Abdominal:     General: There is no distension.     Palpations: Abdomen is soft.     Tenderness: There is no abdominal tenderness. There is no guarding.  Musculoskeletal:        General: No swelling or tenderness. Normal range of motion.     Right lower leg: No edema.     Left lower leg: No edema.  Skin:    General: Skin is warm and dry.  Neurological:     General: No focal deficit present.     Mental Status: She is oriented to person, place, and time.     Coordination: Coordination normal.  Psychiatric:        Mood and Affect: Mood normal.     ED Results / Procedures / Treatments   Labs (all labs ordered are listed, but only abnormal results are displayed) Labs  Reviewed  COMPREHENSIVE METABOLIC PANEL - Abnormal; Notable for the following components:      Result Value   CO2 18 (*)    Glucose, Bld 154 (*)    BUN 33 (*)    Creatinine, Ser 1.37 (*)    Calcium 8.3 (*)    Total Protein 6.2 (*)    Albumin 3.4 (*)    Alkaline Phosphatase 35 (*)    GFR, Estimated 37 (*)    All other components within normal limits  CBC WITH DIFFERENTIAL/PLATELET - Abnormal; Notable for the following components:   Platelets 135 (*)    All other components within normal limits  PROTIME-INR - Abnormal; Notable for the following components:   Prothrombin Time 15.5 (*)    All other components within normal limits  CBG MONITORING, ED - Abnormal; Notable for the following components:   Glucose-Capillary 123 (*)    All other components within normal limits  I-STAT CHEM 8, ED - Abnormal; Notable for the following components:   Chloride 112 (*)    BUN 34 (*)    Creatinine, Ser 1.40 (*)    Glucose, Bld 144 (*)    Calcium, Ion 1.12 (*)    TCO2 20 (*)    All other components within normal limits  SARS CORONAVIRUS 2 BY RT PCR  MAGNESIUM  PHOSPHORUS  BRAIN NATRIURETIC PEPTIDE  URINALYSIS, ROUTINE W REFLEX MICROSCOPIC  TROPONIN I (HIGH SENSITIVITY)    EKG EKG Interpretation Date/Time:  Saturday June 09 2023 09:35:00 EDT Ventricular Rate:  65 PR Interval:  177 QRS Duration:  168 QT Interval:  450 QTC Calculation: 468 R Axis:   268  Text Interpretation: Sinus arrhythmia Ventricular premature complex Consider left atrial enlargement Nonspecific IVCD with LAD Inferior infarct, old Anterior infarct, old agree. Confirmed by Arby Barrette [phone]) on 06/09/2023 4:02:38 PM  Radiology CT Head Wo Contrast  Result Date: 06/09/2023 CLINICAL DATA:  Head trauma. EXAM: CT HEAD WITHOUT CONTRAST TECHNIQUE: Contiguous axial images were obtained from the base of the skull through the vertex without intravenous contrast. RADIATION DOSE REDUCTION: This exam was performed according  to the departmental dose-optimization program which includes automated exposure control, adjustment of the mA and/or kV according to patient size and/or use of iterative reconstruction technique. COMPARISON:  06/07/2008 FINDINGS: Brain: There is no evidence for acute hemorrhage, hydrocephalus, mass lesion, or abnormal extra-axial fluid collection. No definite CT evidence for acute infarction. Vascular: No hyperdense vessel or unexpected calcification. Skull: No evidence for fracture. No worrisome lytic or sclerotic lesion. Sinuses/Orbits: The visualized paranasal sinuses and mastoid air cells are  clear. Visualized portions of the globes and intraorbital fat are unremarkable. Other: None. IMPRESSION: No acute intracranial abnormality. Electronically Signed   By: Kennith Center M.D.   On: 06/09/2023 10:10    Procedures Procedures   CRITICAL CARE Performed by: Arby Barrette   Total critical care time: 60 minutes  Critical care time was exclusive of separately billable procedures and treating other patients.  Critical care was necessary to treat or prevent imminent or life-threatening deterioration.  Critical care was time spent personally by me on the following activities: development of treatment plan with patient and/or surrogate as well as nursing, discussions with consultants, evaluation of patient's response to treatment, examination of patient, obtaining history from patient or surrogate, ordering and performing treatments and interventions, ordering and review of laboratory studies, ordering and review of radiographic studies, pulse oximetry and re-evaluation of patient's condition.  Medications Ordered in ED Medications  0.9 %  sodium chloride infusion (has no administration in time range)  sodium bicarbonate injection 50 mEq (has no administration in time range)  magnesium sulfate IVPB 2 g 50 mL (has no administration in time range)  atropine 1 MG/10ML injection 0.5 mg (has no  administration in time range)  calcium gluconate 10 % injection (4.65 mEq  Given 06/09/23 1015)  glucagon (human recombinant) (GLUCAGEN) 1 MG injection (1 mg  Given 06/09/23 1015)  sodium chloride 0.9 % bolus 500 mL (500 mLs Intravenous New Bag/Given 06/09/23 1030)  atropine injection 0.5 mg (0.5 mg Intravenous Given 06/09/23 1030)    ED Course/ Medical Decision Making/ A&P Clinical Course as of 06/09/23 1044  Sat Jun 09, 2023  1009 I-stat chem 8, ED [MP]    Clinical Course User Index [MP] Arby Barrette, MD                                 Medical Decision Making Amount and/or Complexity of Data Reviewed Labs: ordered. Decision-making details documented in ED Course. Radiology: ordered.  Risk Prescription drug management. Decision regarding hospitalization.  Patient has syncope episode and bradycardia at home.  EMS identified rates in the low teens.  Patient responded to atropine and fluid resuscitation.  Review of EMR does not indicate that the patient is taking any beta-blockers or other medications with high probability for slowing rate.  She does take amlodipine and is chronically on Eliquis for atrial fibrillation.  It 10: 05 patient became bradycardic again after returning from CT scan.  Family members, son and daughter are at bedside.  We discussed management and they advised patient would not want extreme measures.  They said she always did not want to be put on a life support.  We discussed external pacing and possible need for a pacemaker.  At this time they agree she would want IV medications if they reversed the process but not starting external pacing with the notion of pacemaker placement and emergent interventional procedures.  Atropine administered and 0.5 mg increments to 2 mg with brief response but recurrences of bradycardia.  1 amp glucagon and 1 amp calcium chloride administered for possible hyperkalemia or calcium channel blocker toxicity.  Patient does take amlodipine  and review of pharmacy prescribing suggest that she is taking spironolactone.  At this time I do not have any lab values returned.  Returned labs are essentially normal.  I-STAT potassium 4.7.  H&H normal.  Patient has continued to have waxing and waning mental status based on heart  rate.  Underlying rhythm has been consistently atrial flutter with slow ventricular rate.  When rate is down in the low teens, patient becomes symptomatic.  She has responded intermittently to atropine.  At approximately 11: 20 patient advised that she would be agreeable to a pacemaker to resolve bradycardia that is symptomatic.  Initially, patient and family members did not want to pursue anything interventional but at this juncture, with patient having capacity for decision making and previously independent lifestyle, cardiology consulted.  At this time, patient does continue to drop ventricular rate down to low teens and becomes symptomatic.  Will turn on transcutaneous pacing awaiting definitive treatment with transvenous pacemaker placement.  Consult: Reviewed with Dr. Graciela Husbands who advises patient who is appropriate for pacing, consult STEMI doctor and they will be able to do that in the Cath Lab.  Consult: Reviewed with Dr.Thukkani, advises he will place pacemaker and prepare Cath Lab.  I have reassessed the patient and reviewed the plan with the family.  There are multiple family members at bedside.  Patient is still advising she would wish to go forward with transvenous pacer and then subsequently permanent pacer if it will resolve her condition.           Final Clinical Impression(s) / ED Diagnoses Final diagnoses:  Symptomatic bradycardia  Syncope, unspecified syncope type    Rx / DC Orders ED Discharge Orders     None         Arby Barrette, MD 06/09/23 1607

## 2023-06-09 NOTE — Plan of Care (Signed)
  Problem: Education: Goal: Understanding of cardiac disease, CV risk reduction, and recovery process will improve Outcome: Progressing   Problem: Activity: Goal: Ability to tolerate increased activity will improve Outcome: Progressing   Problem: Health Behavior/Discharge Planning: Goal: Ability to safely manage health-related needs after discharge will improve Outcome: Progressing   Problem: Clinical Measurements: Goal: Ability to maintain clinical measurements within normal limits will improve Outcome: Progressing

## 2023-06-09 NOTE — Progress Notes (Signed)
   06/09/23 1015  Spiritual Encounters  Type of Visit Initial  Care provided to: [facility] partners present during encounter Nurse  Referral source Nurse (RN/NT/LPN)  Reason for visit End-of-life  OnCall Visit Yes   Chaplain responded to visit a patient who is actively dying. Chaplain met with patient's daughter and son. Chaplain extended hospitality. Chaplain offered prayer and support.Chaplain introduced spiritual care services. Spiritual care services available as needed.   Alda Ponder, Chaplain 06/09/23
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
## 2023-06-10 ENCOUNTER — Encounter (HOSPITAL_COMMUNITY)
Admission: EM | Disposition: A | Discharge status: Home / Self Care | Payer: Self-pay | Source: Home / Self Care | Attending: Internal Medicine

## 2023-06-10 DIAGNOSIS — I442 Atrioventricular block, complete: Secondary | ICD-10-CM | POA: Diagnosis not present

## 2023-06-10 HISTORY — PX: PACEMAKER IMPLANT: EP1218

## 2023-06-10 LAB — BASIC METABOLIC PANEL
Anion gap: 15 (ref 5–15)
BUN: 25 mg/dL — ABNORMAL HIGH (ref 8–23)
CO2: 19 mmol/L — ABNORMAL LOW (ref 22–32)
Calcium: 9.3 mg/dL (ref 8.9–10.3)
Chloride: 102 mmol/L (ref 98–111)
Creatinine, Ser: 1.31 mg/dL — ABNORMAL HIGH (ref 0.44–1.00)
GFR, Estimated: 39 mL/min — ABNORMAL LOW (ref >60)
Glucose, Bld: 191 mg/dL — ABNORMAL HIGH (ref 70–99)
Potassium: 4.2 mmol/L (ref 3.5–5.1)
Sodium: 136 mmol/L (ref 135–145)

## 2023-06-10 LAB — LIPID PANEL
Cholesterol: 136 mg/dL (ref 0–200)
HDL: 62 mg/dL (ref >40)
LDL Cholesterol: 64 mg/dL (ref 0–99)
Total CHOL/HDL Ratio: 2.2 ratio
Triglycerides: 50 mg/dL (ref <150)
VLDL: 10 mg/dL (ref 0–40)

## 2023-06-10 LAB — GLUCOSE, CAPILLARY
Glucose-Capillary: 87 mg/dL (ref 70–99)
Glucose-Capillary: 89 mg/dL (ref 70–99)
Glucose-Capillary: 109 mg/dL — ABNORMAL HIGH (ref 70–99)
Glucose-Capillary: 126 mg/dL — ABNORMAL HIGH (ref 70–99)

## 2023-06-10 LAB — APTT
aPTT: 75 seconds — ABNORMAL HIGH (ref 24–36)
aPTT: 191 s (ref 24–36)

## 2023-06-10 LAB — HEMOGLOBIN A1C
Hgb A1c MFr Bld: 6.6 % — ABNORMAL HIGH (ref 4.8–5.6)
Mean Plasma Glucose: 142.72 mg/dL

## 2023-06-10 LAB — HEPARIN LEVEL (UNFRACTIONATED): Heparin Unfractionated: >1.1 [IU]/mL — ABNORMAL HIGH (ref 0.30–0.70)

## 2023-06-10 SURGERY — PACEMAKER IMPLANT
Anesthesia: LOCAL

## 2023-06-10 MED ORDER — ONDANSETRON HCL 4 MG/2ML IJ SOLN
INTRAMUSCULAR | Status: DC | PRN
  Administered 2023-06-10: 4 mg via INTRAVENOUS

## 2023-06-10 MED ORDER — CEFAZOLIN SODIUM-DEXTROSE 2-4 GM/100ML-% IV SOLN
2.0000 g | INTRAVENOUS | Status: AC
  Administered 2023-06-10: 2 g via INTRAVENOUS
  Filled 2023-06-10: qty 100

## 2023-06-10 MED ORDER — ONDANSETRON HCL 4 MG/2ML IJ SOLN: 4.0000 mg | Freq: Four times a day (QID) | INTRAMUSCULAR | Status: DC | PRN

## 2023-06-10 MED ORDER — ACETAMINOPHEN 325 MG PO TABS
325.0000 mg | ORAL_TABLET | ORAL | Status: DC | PRN
  Administered 2023-06-10: 325 mg via ORAL
  Administered 2023-06-11: 650 mg via ORAL
  Filled 2023-06-10: qty 2
  Filled 2023-06-10: qty 1

## 2023-06-10 MED ORDER — SODIUM CHLORIDE 0.9 % IV SOLN
80.0000 mg | INTRAVENOUS | Status: AC
  Administered 2023-06-10: 80 mg
  Filled 2023-06-10: qty 2

## 2023-06-10 MED ORDER — SODIUM CHLORIDE 0.9 % IV SOLN: INTRAVENOUS | Status: DC

## 2023-06-10 MED ORDER — HEPARIN (PORCINE) IN NACL 1000-0.9 UT/500ML-% IV SOLN
INTRAVENOUS | Status: DC | PRN
  Administered 2023-06-10: 500 mL

## 2023-06-10 MED ORDER — ONDANSETRON HCL 4 MG/2ML IJ SOLN
INTRAMUSCULAR | Status: AC
  Filled 2023-06-10: qty 2

## 2023-06-10 MED ORDER — CEFAZOLIN SODIUM-DEXTROSE 1-4 GM/50ML-% IV SOLN
1.0000 g | Freq: Four times a day (QID) | INTRAVENOUS | Status: AC
  Administered 2023-06-10 – 2023-06-11 (×3): 1 g via INTRAVENOUS
  Filled 2023-06-10 (×3): qty 50

## 2023-06-10 MED ORDER — LIDOCAINE HCL (PF) 1 % IJ SOLN
INTRAMUSCULAR | Status: AC
  Filled 2023-06-10: qty 60

## 2023-06-10 MED ORDER — CHLORHEXIDINE GLUCONATE 4 % EX SOLN
4.0000 | Freq: Once | CUTANEOUS | Status: AC
  Administered 2023-06-10: 4 via TOPICAL
  Filled 2023-06-10: qty 15

## 2023-06-10 MED ORDER — LIDOCAINE HCL (PF) 1 % IJ SOLN
INTRAMUSCULAR | Status: DC | PRN
  Administered 2023-06-10: 60 mL

## 2023-06-10 SURGICAL SUPPLY — 8 items
CABLE SURGICAL S-101-97-12 (CABLE) ×2 IMPLANT
LEAD ULTIPACE 58 LPA1231/58 (Lead) IMPLANT
MAT PREVALON FULL STRYKER (MISCELLANEOUS) IMPLANT
PACEMAKER ASSURITY SR-SF (Pacemaker) IMPLANT
PAD DEFIB RADIO PHYSIO CONN (PAD) ×2 IMPLANT
SHEATH 7FR PRELUDE SNAP 13 (SHEATH) IMPLANT
TRAY PACEMAKER INSERTION (PACKS) ×2 IMPLANT
WIRE HI TORQ VERSACORE-J 145CM (WIRE) IMPLANT

## 2023-06-10 NOTE — Interval H&P Note (Signed)
History and Physical Interval Note:  06/10/2023 1:03 PM  [name]  has presented today for surgery, with the diagnosis of heart block.  The various methods of treatment have been discussed with the patient and family. After consideration of risks, benefits and other options for treatment, the patient has consented to  Procedure(s): PACEMAKER IMPLANT (N/A) as a surgical intervention.  The patient's history has been reviewed, patient examined, no change in status, stable for surgery.  I have reviewed the patient's chart and labs.  Questions were answered to the patient's satisfaction.     Sherryl Manges  Stable this am,

## 2023-06-10 NOTE — Plan of Care (Signed)
  Problem: Education: Goal: Understanding of cardiac disease, CV risk reduction, and recovery process will improve Outcome: Progressing   Problem: Pain Managment: Goal: General experience of comfort will improve Outcome: Progressing   Problem: Safety: Goal: Ability to remain free from injury will improve Outcome: Progressing   Problem: Education: Goal: Ability to describe self-care measures that may prevent or decrease complications (Diabetes Survival Skills Education) will improve Outcome: Progressing   Problem: Nutritional: Goal: Maintenance of adequate nutrition will improve Outcome: Progressing   Problem: Education: Goal: Knowledge of cardiac device and self-care will improve Outcome: Progressing

## 2023-06-10 NOTE — Progress Notes (Signed)
Progress Note  Patient Name: [name] Date of Encounter: 06/10/2023  Primary Cardiologist: Tonny Bollman, MD     Patient Profile     89 y.o. female admitted with syncope and high grade heart block, prior TAVR, persistent atrial flutter S/p temp TV pacer Normal LV function 8/24  Subjective   Feeling better this am Unfortunately she is eating breakfast-- orders not having been released    Inpatient Medications    Scheduled Meds:  amLODipine  10 mg Oral Daily   chlorhexidine  4 Application Topical Once   Chlorhexidine Gluconate Cloth  6 each Topical Daily   gentamicin (GARAMYCIN) 80 mg in sodium chloride 0.9 % 500 mL irrigation  80 mg Irrigation On Call   insulin aspart  0-5 Units Subcutaneous QHS   insulin aspart  0-9 Units Subcutaneous TID WC   latanoprost  1 drop Both Eyes QHS   levothyroxine  100 mcg Oral Daily   rosuvastatin  10 mg Oral QODAY   spironolactone  25 mg Oral Daily   Continuous Infusions:  sodium chloride 10 mL/hr at 06/10/23 0000   sodium chloride     sodium chloride      ceFAZolin (ANCEF) IV     heparin 1,000 Units/hr (06/10/23 0000)   PRN Meds: sodium chloride, acetaminophen, atropine, nitroGLYCERIN, ondansetron (ZOFRAN) IV, mouth rinse   Vital Signs    Vitals:   06/10/23 0400 06/10/23 0500 06/10/23 0735 06/10/23 0740  BP: (!) 152/69 (!) 145/63    Pulse: 60 60 (!) 58 62  Resp: 18 (!) 23 15 18   Temp: 99.1 F (37.3 C) 99.1 F (37.3 C) 99.3 F (37.4 C) 99.5 F (37.5 C)  TempSrc: Rectal     SpO2: 100% 100% 100% 91%  Weight:      Height:        Intake/Output Summary (Last 24 hours) at 06/10/2023 0751 Last data filed at 06/10/2023 0500 Gross per 24 hour  Intake 1355.95 ml  Output 2275 ml  Net -919.05 ml   Filed Weights   06/09/23 0940  Weight: 81 kg    Telemetry    Atrial flutter  - Personally Reviewed  ECG     - Personally Reviewed  Physical Exam    GEN: No acute distress.   Neck: No JVD Cardiac: RRR, no  murmurs, rubs, or gallops.  Respiratory: Clear to auscultation bilaterally. GI: Soft, nontender, non-distended  MS: No edema; No deformity. Neuro:  Nonfocal  Psych: Normal affect   Labs    Chemistry Recent Labs  Lab 06/09/23 0946 06/09/23 1019  NA 138 140  K 4.7 4.7  CL 111 112*  CO2 18*  --   GLUCOSE 154* 144*  BUN 33* 34*  CREATININE 1.37* 1.40*  CALCIUM 8.3*  --   PROT 6.2*  --   ALBUMIN 3.4*  --   AST 26  --   ALT 26  --   ALKPHOS 35*  --   BILITOT 0.5  --   GFRNONAA 37*  --   ANIONGAP 9  --      Hematology Recent Labs  Lab 06/09/23 0946 06/09/23 1019  WBC 5.1  --   RBC 4.42  --   HGB 13.7 14.6  HCT 43.2 43.0  MCV 97.7  --   MCH 31.0  --   MCHC 31.7  --   RDW 13.2  --   PLT 135*  --     Cardiac EnzymesNo results for input(s): "TROPONINI" in the last  168 hours. No results for input(s): "TROPIPOC" in the last 168 hours.   BNP Recent Labs  Lab 06/09/23 0946  BNP 280.3*     DDimer No results for input(s): "DDIMER" in the last 168 hours.   Radiology    ECHOCARDIOGRAM COMPLETE  Result Date: 06/09/2023    ECHOCARDIOGRAM REPORT   Patient Name:   [name] Date of Exam: 06/09/2023 Medical Rec #:  [id number]           Height:       64.0 in Accession #:    [id number]          Weight:       178.6 lb Date of Birth:  Oct 20, 1931           BSA:          1.864 m Patient Age:    89 years            BP:           182/79 mmHg Patient Gender: F                   HR:           61 bpm. Exam Location:  Inpatient Procedure: 2D Echo, Color Doppler and Cardiac Doppler Indications:    Syncope  History:        Patient has prior history of Echocardiogram examinations, most                 recent 03/20/2022. HFpEF, CKD, Aortic Valve Disease and TAVR                 04/22/19 w/ Edwards SZ3 23mm, Arrythmias:Bradycardia and Atrial                 Flutter; Risk Factors:Hypertension, Diabetes and Dyslipidemia.                 Aortic Valve: 23 mm Sapien prosthetic, stented (TAVR)  valve is                 present in the aortic position. Procedure Date: 03/26/2019.  Sonographer:    Milbert Coulter Referring Phys: [phone] HAO MENG  Sonographer Comments: Image acquisition challenging due to patient body habitus and defib pads. IMPRESSIONS  1. Left ventricular ejection fraction, by estimation, is 55 to 60%. The left ventricle has normal function. The left ventricle has no regional wall motion abnormalities. There is mild concentric left ventricular hypertrophy. Indeterminate diastolic filling due to E-A fusion.  2. Right ventricular systolic function is normal. The right ventricular size is normal. There is mildly elevated pulmonary artery systolic pressure. The estimated right ventricular systolic pressure is 37.1 mmHg.  3. A small pericardial effusion is present. The pericardial effusion is circumferential.  4. The mitral valve is degenerative. Mild mitral valve regurgitation. No evidence of mitral stenosis.  5. The aortic valve has been repaired/replaced. Aortic valve regurgitation is not visualized. There is a 23 mm Sapien prosthetic (TAVR) valve present in the aortic position. Procedure Date: 03/26/2019. Echo findings are consistent with normal structure and function of the aortic valve prosthesis. Aortic valve area, by VTI measures 1.90 cm. Aortic valve mean gradient measures 5.0 mmHg. Aortic valve Vmax measures 1.63 m/s.  6. The inferior vena cava is normal in size with greater than 50% respiratory variability, suggesting right atrial pressure of 3 mmHg. Comparison(s): No significant change from prior study. FINDINGS  Left Ventricle: Left ventricular ejection fraction, by estimation, is  55 to 60%. The left ventricle has normal function. The left ventricle has no regional wall motion abnormalities. The left ventricular internal cavity size was normal in size. There is  mild concentric left ventricular hypertrophy. Indeterminate diastolic filling due to E-A fusion. Right Ventricle: The right  ventricular size is normal. No increase in right ventricular wall thickness. Right ventricular systolic function is normal. There is mildly elevated pulmonary artery systolic pressure. The tricuspid regurgitant velocity is 2.92  m/s, and with an assumed right atrial pressure of 3 mmHg, the estimated right ventricular systolic pressure is 37.1 mmHg. Left Atrium: Left atrial size was normal in size. Right Atrium: Right atrial size was normal in size. Pericardium: A small pericardial effusion is present. The pericardial effusion is circumferential. Mitral Valve: The mitral valve is degenerative in appearance. Mild to moderate mitral annular calcification. Mild mitral valve regurgitation. No evidence of mitral valve stenosis. Tricuspid Valve: The tricuspid valve is grossly normal. Tricuspid valve regurgitation is mild . No evidence of tricuspid stenosis. Aortic Valve: The aortic valve has been repaired/replaced. Aortic valve regurgitation is not visualized. Aortic valve mean gradient measures 5.0 mmHg. Aortic valve peak gradient measures 10.6 mmHg. Aortic valve area, by VTI measures 1.90 cm. There is a 23 mm Sapien prosthetic, stented (TAVR) valve present in the aortic position. Procedure Date: 03/26/2019. Echo findings are consistent with normal structure and function of the aortic valve prosthesis. Pulmonic Valve: The pulmonic valve was grossly normal. Pulmonic valve regurgitation is trivial. No evidence of pulmonic stenosis. Aorta: The aortic root is normal in size and structure. Venous: The inferior vena cava is normal in size with greater than 50% respiratory variability, suggesting right atrial pressure of 3 mmHg. IAS/Shunts: The atrial septum is grossly normal.  LEFT VENTRICLE PLAX 2D LVIDd:         4.60 cm LVIDs:         3.60 cm LV PW:         1.20 cm LV IVS:        1.20 cm LVOT diam:     2.18 cm LV SV:         61 LV SV Index:   33 LVOT Area:     3.73 cm  LEFT ATRIUM            Index LA diam:      4.50 cm  2.41  cm/m LA Vol (A2C): 43.4 ml  23.28 ml/m LA Vol (A4C): 103.0 ml 55.25 ml/m  AORTIC VALVE AV Area (Vmax):    1.96 cm AV Area (Vmean):   1.90 cm AV Area (VTI):     1.90 cm AV Vmax:           163.00 cm/s AV Vmean:          106.000 cm/s AV VTI:            0.323 m AV Peak Grad:      10.6 mmHg AV Mean Grad:      5.0 mmHg LVOT Vmax:         85.40 cm/s LVOT Vmean:        54.000 cm/s LVOT VTI:          0.164 m LVOT/AV VTI ratio: 0.51  AORTA Ao Root diam: 2.90 cm TRICUSPID VALVE TR Peak grad:   34.1 mmHg TR Vmax:        292.00 cm/s  SHUNTS Systemic VTI:  0.16 m Systemic Diam: 2.18 cm Lennie Odor MD Electronically signed by Lennie Odor  MD Signature Date/Time: 06/09/2023/5:41:27 PM    Final    DG CHEST PORT 1 VIEW  Addendum Date: 06/09/2023   ADDENDUM REPORT: 06/09/2023 17:38 ADDENDUM: Per discussion with the ordering provider, this is a temporary right IJ pacemaker and is well positioned. Electronically Signed   By: Delbert Phenix M.D.   On: 06/09/2023 17:38   Result Date: 06/09/2023 CLINICAL DATA:  Central line placement EXAM: PORTABLE CHEST 1 VIEW COMPARISON:  Chest radiograph from earlier today. FINDINGS: Right internal jugular central venous catheter appears to terminate over the inferior heart likely over the right ventricle. Stable cardiomediastinal silhouette with mild cardiomegaly. TAVR in place. No pneumothorax. No pleural effusion. No pulmonary edema. Minimal bibasilar atelectasis is similar. Pacer pad overlies the upper left chest. IMPRESSION: 1. Right internal jugular central venous catheter appears to terminate over the inferior heart likely over the right ventricle. No pneumothorax. 2. Stable mild cardiomegaly without pulmonary edema. 3. Stable minimal bibasilar atelectasis. Electronically Signed: By: Delbert Phenix M.D. On: 06/09/2023 17:34   CARDIAC CATHETERIZATION  Result Date: 06/09/2023 1.  Successful right IJ temporary pacemaker placement with an threshold of 0.3 mA, an output of 5 mA, and a  backup rate of 60 bpm. Recommendation: EP consultation and follow-up chest x-ray.  The results were discussed with Dr. Graciela Husbands.   DG Chest Port 1 View  Result Date: 06/09/2023 CLINICAL DATA:  Syncope. EXAM: PORTABLE CHEST 1 VIEW COMPARISON:  Chest radiograph dated 03/19/2022. FINDINGS: The heart is enlarged. Vascular calcifications are seen in the aortic arch. A stent overlying the aortic valve appears unchanged. The lungs are clear. Degenerative changes are seen in the spine. IMPRESSION: Cardiomegaly. No acute cardiopulmonary process. Electronically Signed   By: Romona Curls M.D.   On: 06/09/2023 10:49   CT Head Wo Contrast  Result Date: 06/09/2023 CLINICAL DATA:  Head trauma. EXAM: CT HEAD WITHOUT CONTRAST TECHNIQUE: Contiguous axial images were obtained from the base of the skull through the vertex without intravenous contrast. RADIATION DOSE REDUCTION: This exam was performed according to the departmental dose-optimization program which includes automated exposure control, adjustment of the mA and/or kV according to patient size and/or use of iterative reconstruction technique. COMPARISON:  06/07/2008 FINDINGS: Brain: There is no evidence for acute hemorrhage, hydrocephalus, mass lesion, or abnormal extra-axial fluid collection. No definite CT evidence for acute infarction. Vascular: No hyperdense vessel or unexpected calcification. Skull: No evidence for fracture. No worrisome lytic or sclerotic lesion. Sinuses/Orbits: The visualized paranasal sinuses and mastoid air cells are clear. Visualized portions of the globes and intraorbital fat are unremarkable. Other: None. IMPRESSION: No acute intracranial abnormality. Electronically Signed   By: Kennith Center M.D.   On: 06/09/2023 10:10    Cardiac Studies   (As above)  Assessment & Plan    Syncope   Atrial flutter with bradycardia   Spontaneous but brief reversion to sinus rhythm (see above)   Left bundle branch block   TAVR 6/20   HFpEF  chronic   Hypertension   Temporary transvenous pacemaker placed ' Anticipated PM this am will need to be postponed.  We have been successful rescheduling it to about 1300 today.  BP reasonable continue on spiro and amlodipine  Will stop heparin as we try and navigate      For questions or updates, please contact [facility] Please consult [email or web address] for contact info under Cardiology/STEMI.      Signed, Sherryl Manges, MD  06/10/2023, 7:51 AM

## 2023-06-10 NOTE — H&P (View-Only) (Signed)
Progress Note  Patient Name: [name] Date of Encounter: 06/10/2023  Primary Cardiologist: Tonny Bollman, MD     Patient Profile     89 y.o. female admitted with syncope and high grade heart block, prior TAVR, persistent atrial flutter S/p temp TV pacer Normal LV function 8/24  Subjective   Feeling better this am Unfortunately she is eating breakfast-- orders not having been released    Inpatient Medications    Scheduled Meds:  amLODipine  10 mg Oral Daily   chlorhexidine  4 Application Topical Once   Chlorhexidine Gluconate Cloth  6 each Topical Daily   gentamicin (GARAMYCIN) 80 mg in sodium chloride 0.9 % 500 mL irrigation  80 mg Irrigation On Call   insulin aspart  0-5 Units Subcutaneous QHS   insulin aspart  0-9 Units Subcutaneous TID WC   latanoprost  1 drop Both Eyes QHS   levothyroxine  100 mcg Oral Daily   rosuvastatin  10 mg Oral QODAY   spironolactone  25 mg Oral Daily   Continuous Infusions:  sodium chloride 10 mL/hr at 06/10/23 0000   sodium chloride     sodium chloride      ceFAZolin (ANCEF) IV     heparin 1,000 Units/hr (06/10/23 0000)   PRN Meds: sodium chloride, acetaminophen, atropine, nitroGLYCERIN, ondansetron (ZOFRAN) IV, mouth rinse   Vital Signs    Vitals:   06/10/23 0400 06/10/23 0500 06/10/23 0735 06/10/23 0740  BP: (!) 152/69 (!) 145/63    Pulse: 60 60 (!) 58 62  Resp: 18 (!) 23 15 18   Temp: 99.1 F (37.3 C) 99.1 F (37.3 C) 99.3 F (37.4 C) 99.5 F (37.5 C)  TempSrc: Rectal     SpO2: 100% 100% 100% 91%  Weight:      Height:        Intake/Output Summary (Last 24 hours) at 06/10/2023 0751 Last data filed at 06/10/2023 0500 Gross per 24 hour  Intake 1355.95 ml  Output 2275 ml  Net -919.05 ml   Filed Weights   06/09/23 0940  Weight: 81 kg    Telemetry    Atrial flutter  - Personally Reviewed  ECG     - Personally Reviewed  Physical Exam    GEN: No acute distress.   Neck: No JVD Cardiac: RRR, no  murmurs, rubs, or gallops.  Respiratory: Clear to auscultation bilaterally. GI: Soft, nontender, non-distended  MS: No edema; No deformity. Neuro:  Nonfocal  Psych: Normal affect   Labs    Chemistry Recent Labs  Lab 06/09/23 0946 06/09/23 1019  NA 138 140  K 4.7 4.7  CL 111 112*  CO2 18*  --   GLUCOSE 154* 144*  BUN 33* 34*  CREATININE 1.37* 1.40*  CALCIUM 8.3*  --   PROT 6.2*  --   ALBUMIN 3.4*  --   AST 26  --   ALT 26  --   ALKPHOS 35*  --   BILITOT 0.5  --   GFRNONAA 37*  --   ANIONGAP 9  --      Hematology Recent Labs  Lab 06/09/23 0946 06/09/23 1019  WBC 5.1  --   RBC 4.42  --   HGB 13.7 14.6  HCT 43.2 43.0  MCV 97.7  --   MCH 31.0  --   MCHC 31.7  --   RDW 13.2  --   PLT 135*  --     Cardiac EnzymesNo results for input(s): "TROPONINI" in the last  168 hours. No results for input(s): "TROPIPOC" in the last 168 hours.   BNP Recent Labs  Lab 06/09/23 0946  BNP 280.3*     DDimer No results for input(s): "DDIMER" in the last 168 hours.   Radiology    ECHOCARDIOGRAM COMPLETE  Result Date: 06/09/2023    ECHOCARDIOGRAM REPORT   Patient Name:   [name] Date of Exam: 06/09/2023 Medical Rec #:  [id number]           Height:       64.0 in Accession #:    [id number]          Weight:       178.6 lb Date of Birth:  Oct 20, 1931           BSA:          1.864 m Patient Age:    89 years            BP:           182/79 mmHg Patient Gender: F                   HR:           61 bpm. Exam Location:  Inpatient Procedure: 2D Echo, Color Doppler and Cardiac Doppler Indications:    Syncope  History:        Patient has prior history of Echocardiogram examinations, most                 recent 03/20/2022. HFpEF, CKD, Aortic Valve Disease and TAVR                 04/22/19 w/ Edwards SZ3 23mm, Arrythmias:Bradycardia and Atrial                 Flutter; Risk Factors:Hypertension, Diabetes and Dyslipidemia.                 Aortic Valve: 23 mm Sapien prosthetic, stented (TAVR)  valve is                 present in the aortic position. Procedure Date: 03/26/2019.  Sonographer:    Milbert Coulter Referring Phys: [phone] HAO MENG  Sonographer Comments: Image acquisition challenging due to patient body habitus and defib pads. IMPRESSIONS  1. Left ventricular ejection fraction, by estimation, is 55 to 60%. The left ventricle has normal function. The left ventricle has no regional wall motion abnormalities. There is mild concentric left ventricular hypertrophy. Indeterminate diastolic filling due to E-A fusion.  2. Right ventricular systolic function is normal. The right ventricular size is normal. There is mildly elevated pulmonary artery systolic pressure. The estimated right ventricular systolic pressure is 37.1 mmHg.  3. A small pericardial effusion is present. The pericardial effusion is circumferential.  4. The mitral valve is degenerative. Mild mitral valve regurgitation. No evidence of mitral stenosis.  5. The aortic valve has been repaired/replaced. Aortic valve regurgitation is not visualized. There is a 23 mm Sapien prosthetic (TAVR) valve present in the aortic position. Procedure Date: 03/26/2019. Echo findings are consistent with normal structure and function of the aortic valve prosthesis. Aortic valve area, by VTI measures 1.90 cm. Aortic valve mean gradient measures 5.0 mmHg. Aortic valve Vmax measures 1.63 m/s.  6. The inferior vena cava is normal in size with greater than 50% respiratory variability, suggesting right atrial pressure of 3 mmHg. Comparison(s): No significant change from prior study. FINDINGS  Left Ventricle: Left ventricular ejection fraction, by estimation, is  55 to 60%. The left ventricle has normal function. The left ventricle has no regional wall motion abnormalities. The left ventricular internal cavity size was normal in size. There is  mild concentric left ventricular hypertrophy. Indeterminate diastolic filling due to E-A fusion. Right Ventricle: The right  ventricular size is normal. No increase in right ventricular wall thickness. Right ventricular systolic function is normal. There is mildly elevated pulmonary artery systolic pressure. The tricuspid regurgitant velocity is 2.92  m/s, and with an assumed right atrial pressure of 3 mmHg, the estimated right ventricular systolic pressure is 37.1 mmHg. Left Atrium: Left atrial size was normal in size. Right Atrium: Right atrial size was normal in size. Pericardium: A small pericardial effusion is present. The pericardial effusion is circumferential. Mitral Valve: The mitral valve is degenerative in appearance. Mild to moderate mitral annular calcification. Mild mitral valve regurgitation. No evidence of mitral valve stenosis. Tricuspid Valve: The tricuspid valve is grossly normal. Tricuspid valve regurgitation is mild . No evidence of tricuspid stenosis. Aortic Valve: The aortic valve has been repaired/replaced. Aortic valve regurgitation is not visualized. Aortic valve mean gradient measures 5.0 mmHg. Aortic valve peak gradient measures 10.6 mmHg. Aortic valve area, by VTI measures 1.90 cm. There is a 23 mm Sapien prosthetic, stented (TAVR) valve present in the aortic position. Procedure Date: 03/26/2019. Echo findings are consistent with normal structure and function of the aortic valve prosthesis. Pulmonic Valve: The pulmonic valve was grossly normal. Pulmonic valve regurgitation is trivial. No evidence of pulmonic stenosis. Aorta: The aortic root is normal in size and structure. Venous: The inferior vena cava is normal in size with greater than 50% respiratory variability, suggesting right atrial pressure of 3 mmHg. IAS/Shunts: The atrial septum is grossly normal.  LEFT VENTRICLE PLAX 2D LVIDd:         4.60 cm LVIDs:         3.60 cm LV PW:         1.20 cm LV IVS:        1.20 cm LVOT diam:     2.18 cm LV SV:         61 LV SV Index:   33 LVOT Area:     3.73 cm  LEFT ATRIUM            Index LA diam:      4.50 cm  2.41  cm/m LA Vol (A2C): 43.4 ml  23.28 ml/m LA Vol (A4C): 103.0 ml 55.25 ml/m  AORTIC VALVE AV Area (Vmax):    1.96 cm AV Area (Vmean):   1.90 cm AV Area (VTI):     1.90 cm AV Vmax:           163.00 cm/s AV Vmean:          106.000 cm/s AV VTI:            0.323 m AV Peak Grad:      10.6 mmHg AV Mean Grad:      5.0 mmHg LVOT Vmax:         85.40 cm/s LVOT Vmean:        54.000 cm/s LVOT VTI:          0.164 m LVOT/AV VTI ratio: 0.51  AORTA Ao Root diam: 2.90 cm TRICUSPID VALVE TR Peak grad:   34.1 mmHg TR Vmax:        292.00 cm/s  SHUNTS Systemic VTI:  0.16 m Systemic Diam: 2.18 cm Lennie Odor MD Electronically signed by Lennie Odor  MD Signature Date/Time: 06/09/2023/5:41:27 PM    Final    DG CHEST PORT 1 VIEW  Addendum Date: 06/09/2023   ADDENDUM REPORT: 06/09/2023 17:38 ADDENDUM: Per discussion with the ordering provider, this is a temporary right IJ pacemaker and is well positioned. Electronically Signed   By: Delbert Phenix M.D.   On: 06/09/2023 17:38   Result Date: 06/09/2023 CLINICAL DATA:  Central line placement EXAM: PORTABLE CHEST 1 VIEW COMPARISON:  Chest radiograph from earlier today. FINDINGS: Right internal jugular central venous catheter appears to terminate over the inferior heart likely over the right ventricle. Stable cardiomediastinal silhouette with mild cardiomegaly. TAVR in place. No pneumothorax. No pleural effusion. No pulmonary edema. Minimal bibasilar atelectasis is similar. Pacer pad overlies the upper left chest. IMPRESSION: 1. Right internal jugular central venous catheter appears to terminate over the inferior heart likely over the right ventricle. No pneumothorax. 2. Stable mild cardiomegaly without pulmonary edema. 3. Stable minimal bibasilar atelectasis. Electronically Signed: By: Delbert Phenix M.D. On: 06/09/2023 17:34   CARDIAC CATHETERIZATION  Result Date: 06/09/2023 1.  Successful right IJ temporary pacemaker placement with an threshold of 0.3 mA, an output of 5 mA, and a  backup rate of 60 bpm. Recommendation: EP consultation and follow-up chest x-ray.  The results were discussed with Dr. Graciela Husbands.   DG Chest Port 1 View  Result Date: 06/09/2023 CLINICAL DATA:  Syncope. EXAM: PORTABLE CHEST 1 VIEW COMPARISON:  Chest radiograph dated 03/19/2022. FINDINGS: The heart is enlarged. Vascular calcifications are seen in the aortic arch. A stent overlying the aortic valve appears unchanged. The lungs are clear. Degenerative changes are seen in the spine. IMPRESSION: Cardiomegaly. No acute cardiopulmonary process. Electronically Signed   By: Romona Curls M.D.   On: 06/09/2023 10:49   CT Head Wo Contrast  Result Date: 06/09/2023 CLINICAL DATA:  Head trauma. EXAM: CT HEAD WITHOUT CONTRAST TECHNIQUE: Contiguous axial images were obtained from the base of the skull through the vertex without intravenous contrast. RADIATION DOSE REDUCTION: This exam was performed according to the departmental dose-optimization program which includes automated exposure control, adjustment of the mA and/or kV according to patient size and/or use of iterative reconstruction technique. COMPARISON:  06/07/2008 FINDINGS: Brain: There is no evidence for acute hemorrhage, hydrocephalus, mass lesion, or abnormal extra-axial fluid collection. No definite CT evidence for acute infarction. Vascular: No hyperdense vessel or unexpected calcification. Skull: No evidence for fracture. No worrisome lytic or sclerotic lesion. Sinuses/Orbits: The visualized paranasal sinuses and mastoid air cells are clear. Visualized portions of the globes and intraorbital fat are unremarkable. Other: None. IMPRESSION: No acute intracranial abnormality. Electronically Signed   By: Kennith Center M.D.   On: 06/09/2023 10:10    Cardiac Studies   (As above)  Assessment & Plan    Syncope   Atrial flutter with bradycardia   Spontaneous but brief reversion to sinus rhythm (see above)   Left bundle branch block   TAVR 6/20   HFpEF  chronic   Hypertension   Temporary transvenous pacemaker placed ' Anticipated PM this am will need to be postponed.  We have been successful rescheduling it to about 1300 today.  BP reasonable continue on spiro and amlodipine  Will stop heparin as we try and navigate      For questions or updates, please contact [facility] Please consult [email or web address] for contact info under Cardiology/STEMI.      Signed, Sherryl Manges, MD  06/10/2023, 7:51 AM

## 2023-06-10 NOTE — Progress Notes (Signed)
1250 Patient to Cath lab with Cath lab RN.  Family at bedside aware of procedure.  Pre-procedure tasks complete.  Patient transferred on [place].
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
## 2023-06-11 ENCOUNTER — Encounter (HOSPITAL_COMMUNITY): Payer: Self-pay | Admitting: Internal Medicine

## 2023-06-11 ENCOUNTER — Other Ambulatory Visit: Payer: Self-pay | Admitting: Physician Assistant

## 2023-06-11 ENCOUNTER — Inpatient Hospital Stay (HOSPITAL_COMMUNITY): Payer: Medicare Other

## 2023-06-11 DIAGNOSIS — N179 Acute kidney failure, unspecified: Secondary | ICD-10-CM

## 2023-06-11 LAB — BASIC METABOLIC PANEL
Anion gap: 16 — ABNORMAL HIGH (ref 5–15)
BUN: 30 mg/dL — ABNORMAL HIGH (ref 8–23)
CO2: 15 mmol/L — ABNORMAL LOW (ref 22–32)
Calcium: 8.6 mg/dL — ABNORMAL LOW (ref 8.9–10.3)
Chloride: 104 mmol/L (ref 98–111)
Creatinine, Ser: 1.65 mg/dL — ABNORMAL HIGH (ref 0.44–1.00)
GFR, Estimated: 29 mL/min — ABNORMAL LOW (ref >60)
Glucose, Bld: 87 mg/dL (ref 70–99)
Potassium: 5.1 mmol/L (ref 3.5–5.1)
Sodium: 135 mmol/L (ref 135–145)

## 2023-06-11 LAB — GLUCOSE, CAPILLARY
Glucose-Capillary: 116 mg/dL — ABNORMAL HIGH (ref 70–99)
Glucose-Capillary: 115 mg/dL — ABNORMAL HIGH (ref 70–99)
Glucose-Capillary: 104 mg/dL — ABNORMAL HIGH (ref 70–99)

## 2023-06-11 LAB — HEPARIN LEVEL (UNFRACTIONATED): Heparin Unfractionated: >1.1 [IU]/mL — ABNORMAL HIGH (ref 0.30–0.70)

## 2023-06-11 NOTE — Discharge Instructions (Signed)
After Your Pacemaker   You have a [employer] Pacemaker  ACTIVITY Do not lift your arm above shoulder height for 1 week after your procedure. After 7 days, you may progress as below.  You should remove your sling 24 hours after your procedure, unless otherwise instructed by your provider.     Monday June 18, 2023  Tuesday June 19, 2023 Wednesday June 20, 2023 Thursday June 21, 2023   Do not lift, push, pull, or carry anything over 10 pounds with the affected arm until 6 weeks (Monday July 23, 2023 ) after your procedure.   You may drive AFTER your wound check, unless you have been told otherwise by your provider.   Ask your healthcare provider when you can go back to work   INCISION/Dressing If you are on a blood thinner such as Coumadin, Xarelto, Eliquis, Plavix, or Pradaxa please confirm with your provider when this should be resumed. Thursday 06/14/23  Monitor your Pacemaker site for redness, swelling, and drainage. Call the device clinic at [phone] if you experience these symptoms or fever/chills.  If your incision is closed with Dermabond/Surgical glue. You may shower 1 day after your pacemaker implant and wash around the site with soap and water.    If you were discharged in a sling, please do not wear this during the day more than 48 hours after your surgery unless otherwise instructed. This may increase the risk of stiffness and soreness in your shoulder.   Avoid lotions, ointments, or perfumes over your incision until it is well-healed.  You may use a hot tub or a pool AFTER your wound check appointment if the incision is completely closed.  Pacemaker Alerts:  Some alerts are vibratory and others beep. These are NOT emergencies. Please call our office to let us know. If this occurs at night or on weekends, it can wait until the next business day. Send a remote transmission.  If your device is capable of reading fluid status (for heart failure), you will be  offered monthly monitoring to review this with you.   DEVICE MANAGEMENT Remote monitoring is used to monitor your pacemaker from home. This monitoring is scheduled every 91 days by our office. It allows [place] to keep an eye on the functioning of your device to ensure it is working properly. You will routinely see your Electrophysiologist annually (more often if necessary).   You should receive your ID card for your new device in 4-8 weeks. Keep this card with you at all times once received. Consider wearing a medical alert bracelet or necklace.  Your Pacemaker may be MRI compatible. This will be discussed at your next office visit/wound check.  You should avoid contact with strong electric or magnetic fields.   Do not use amateur (ham) radio equipment or electric (arc) welding torches. MP3 player headphones with magnets should not be used. Some devices are safe to use if held at least 12 inches (30 cm) from your Pacemaker. These include power tools, lawn mowers, and speakers. If you are unsure if something is safe to use, ask your health care provider.  When using your cell phone, hold it to the ear that is on the opposite side from the Pacemaker. Do not leave your cell phone in a pocket over the Pacemaker.  You may safely use electric blankets, heating pads, computers, and microwave ovens.  Call the office right away if: You have chest pain. You feel more short of breath than you have felt before. You  feel more light-headed than you have felt before. Your incision starts to open up.  This information is not intended to replace advice given to you by your health care provider. Make sure you discuss any questions you have with your health care provider.
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
## 2023-06-11 NOTE — Plan of Care (Signed)

## 2023-06-11 NOTE — Discharge Summary (Cosign Needed Addendum)
DISCHARGE SUMMARY    Patient ID: [name],  MRN: [id number], DOB/AGE: [birth year] 89 y.o.  Admit date: 06/09/2023 Discharge date: 06/11/2023  Primary Care Physician: Mercie Eon, MD  Primary Cardiologist: Dr. Excell Seltzer Electrophysiologist: new, Dr. Graciela Husbands  Primary Discharge Diagnosis:  Syncope Symptomatic bradycardia status post pacemaker implantation this admission  Secondary Discharge Diagnosis:  HTN DM LBBB Chronic CHF/HFpEF AFib VHD H/o TAVR  Allergies  Allergen Reactions   Lisinopril     Angioneurotic edema     Procedures This Admission:  1.  Implantation of a Abbott single PPM on 06/10/23 by Dr Graciela Husbands.   There were no immediate post procedure complications. CXR on 06/11/23 demonstrated no pneumothorax status post device implantation.   Brief HPI: [name] is a 89 y.o. female Presented with syncope this morning after awakening was sitting on the side of the bed and is found herself on the floor.  Upon arrival by EMS she was bradycardic in sinus rhythm.  There was initial improvement in heart rate and then there was  recurrence of atrial flutter followed again by bradycardia recurred which was symptomatic.  She does have transcutaneously paced and then underwent temporary transvenous pacing    Hospital Course:  The patient's labs largely stable from her baseline, on no nodal blocking agents, she  was admitted with no reversible causes, EP consulted.  Her home eliquis held for procedures Dr. Graciela Husbands noted in June, she underwent cardioversion for atrial flutter/fib.  8/23 was found to be back in atrial flutter and it was elected at that time not to proceed with either antiarrhythmic therapy or repeat cardioversion. Yesterday she underwent implantation of a PPM with details as outlined in the procedure report.  She was monitored on telemetry overnight which demonstrated AFlutter/V pacing.  Left chest was without hematoma or ecchymosis.  The device  was interrogated and found to be functioning normally.  CXR was obtained and demonstrated no pneumothorax status post device implantation.  Wound care, arm mobility, and restrictions were reviewed with the patient.  The patient feels well, denies any CP/SOB, with minimal site discomfort.  She was examined by Dr. Graciela Husbands and considered stable for discharge to home.   Labs today with upwards trending Creat BP is good on current Will stop home losartan and hold aldactone for now BMET on Wed to guide resumption of her spironolactone  Resume Eliquis Thurs 06/14/23   Physical Exam: Vitals:   06/11/23 1000 06/11/23 1055 06/11/23 1115 06/11/23 1122  BP: (!) 121/58  129/65   Pulse: (!) 50 64 62   Resp: 17 16 20    Temp:    98 F (36.7 C)  TempSrc:    Oral  SpO2: 98% 97% 95%   Weight:      Height:        GEN- The patient is well appearing, alert and oriented x 3 today.   HEENT: normocephalic, atraumatic; sclera clear, conjunctiva pink; hearing intact; oropharynx clear; neck supple, no JVP R internal jugular site is stable, no bleeding, hematoma Lungs-  CTA b/l, normal work of breathing.  No wheezes, rales, rhonchi Heart- RRR (paced), no murmurs, rubs or gallops, PMI not laterally displaced GI- soft, non-tender, non-distended Extremities- no clubbing, cyanosis, or edema MS- no significant deformity or atrophy Skin- warm and dry, no rash or lesion,  left chest without hematoma/ecchymosis Psych- euthymic mood, full affect Neuro- no gross deficits   Labs:   Lab Results  Component Value Date  WBC 5.1 06/09/2023   HGB 14.6 06/09/2023   HCT 43.0 06/09/2023   MCV 97.7 06/09/2023   PLT 135 (L) 06/09/2023    Recent Labs  Lab 06/09/23 0946 06/09/23 1019 06/11/23 0019  NA 138   < > 135  K 4.7   < > 5.1  CL 111   < > 104  CO2 18*   < > 15*  BUN 33*   < > 30*  CREATININE 1.37*   < > 1.65*  CALCIUM 8.3*   < > 8.6*  PROT 6.2*  --   --   BILITOT 0.5  --   --   ALKPHOS 35*  --   --    ALT 26  --   --   AST 26  --   --   GLUCOSE 154*   < > 87   < > = values in this interval not displayed.    Discharge Medications:  Allergies as of 06/11/2023       Reactions   Lisinopril    Angioneurotic edema        Medication List     STOP taking these medications    losartan 100 MG tablet Commonly known as: COZAAR       TAKE these medications    acetaminophen 500 MG tablet Commonly known as: TYLENOL Take 500 mg by mouth every 6 (six) hours as needed for mild pain, moderate pain or headache.   amLODipine 10 MG tablet Commonly known as: NORVASC Take 1 tablet (10 mg total) by mouth daily.   apixaban 5 MG Tabs tablet Commonly known as: Eliquis Take 1 tablet (5 mg total) by mouth 2 (two) times daily. Overdue for follow-up, MUST see MD for FUTURE refills. Notes to patient: Do not resume until 06/14/23   empagliflozin 25 MG Tabs tablet Commonly known as: Jardiance Take 1 tablet (25 mg total) by mouth daily before breakfast.   latanoprost 0.005 % ophthalmic solution Commonly known as: XALATAN Place 1 drop into both eyes at bedtime.   levothyroxine 100 MCG tablet Commonly known as: SYNTHROID Take 1 tablet (100 mcg total) by mouth daily.   rosuvastatin 10 MG tablet Commonly known as: CRESTOR Take 1 tablet (10 mg total) by mouth every other day.   spironolactone 25 MG tablet Commonly known as: Aldactone Take 1 tablet (25 mg total) by mouth daily. Notes to patient: Do not resume this medication until you have been advised to.  You will be called once the doctor has seen the results of your out patient labs   terbinafine 250 MG tablet Commonly known as: LAMISIL Take 250 mg by mouth daily.   timolol 0.5 % ophthalmic solution Commonly known as: TIMOPTIC Place 1 drop into both eyes every morning.        Disposition: home Discharge Instructions     Diet - low sodium heart healthy   Complete by: As directed    Increase activity slowly   Complete by:  As directed         Duration of Discharge Encounter: Greater than 30 minutes including physician time.  Norma Fredrickson, PA-C 06/11/2023 11:59 AM

## 2023-06-11 NOTE — TOC Initial Note (Addendum)
Transition of Care [facility]) - Initial/Assessment Note    Patient Details  Name: [name] MRN: [id number] Date of Birth: Mar 17, 1931  [facility] CM/SW Contact:    Elliot Cousin, RN Phone Number: [phone] 06/11/2023, 12:38 PM  Clinical Narrative:  PPM on 06/10/23   CM spoke to pt and son at bedside. Pt states she lives alone but grandson plans to move in and dtr will stay with her until she is back to her baseline. States she does not need any DME in the home. Her children assist with transportation to appts. Pt will follow up with her PCP to schedule hospital follow up appt.                  Expected Discharge Plan: Home/Self Care Barriers to Discharge: No Barriers Identified   Patient Goals and CMS Choice Patient states their goals for this hospitalization and ongoing recovery are:: wants to remain independent at home          Expected Discharge Plan and Services   Discharge Planning Services: CM Consult   Living arrangements for the past 2 months: Single Family Home Expected Discharge Date: 06/11/23                                    Prior Living Arrangements/Services Living arrangements for the past 2 months: Single Family Home     Do you feel safe going back to the place where you live?: Yes               Activities of Daily Living Home Assistive Devices/Equipment: None ADL Screening (condition at time of admission) Patient's cognitive ability adequate to safely complete daily activities?: Yes Is the patient deaf or have difficulty hearing?: No Does the patient have difficulty seeing, even when wearing glasses/contacts?: No Does the patient have difficulty concentrating, remembering, or making decisions?: No Patient able to express need for assistance with ADLs?: Yes Does the patient have difficulty dressing or bathing?: No Independently performs ADLs?: Yes (appropriate for developmental age) Does the patient have  difficulty walking or climbing stairs?: No Weakness of Legs: None Weakness of Arms/Hands: None  Permission Sought/Granted Permission sought to share information with : Case Manager, Family Supports, PCP Permission granted to share information with : Yes, Verbal Permission Granted              Emotional Assessment Appearance:: Appears stated age, Appears younger than stated age Attitude/Demeanor/Rapport: Engaged Affect (typically observed): Accepting Orientation: : Oriented to Self, Oriented to Place, Oriented to  Time, Oriented to Situation   Psych Involvement: No (comment)  Admission diagnosis:  Symptomatic bradycardia [R00.1] Patient Active Problem List   Diagnosis Date Noted   Symptomatic bradycardia 06/09/2023   Healthcare maintenance 02/14/2023   Hypercalcemia 05/17/2022   CAD (coronary artery disease) 04/05/2022   Bradycardia    Atrial flutter (HCC)    (HFpEF) heart failure with preserved ejection fraction (HCC) 03/19/2022   Stage 3a chronic kidney disease (CKD) (HCC) 03/17/2022   Hypothyroidism 03/17/2022   Severe aortic stenosis s/p TAVR 04/22/2019   Essential hypertension 11/19/2013   Type II diabetes mellitus (HCC) 03/05/2013   Hyperlipidemia    PCP:  Mercie Eon, MD Pharmacy:   [facility] - [place], [place] - [address] [place] [address] Phone: [phone] Fax: [phone]     Social  Determinants of Health (SDOH) Social History: SDOH Screenings   Food Insecurity: No Food Insecurity (06/09/2023)  Housing: Low Risk  (06/09/2023)  Transportation Needs: No Transportation Needs (06/09/2023)  Utilities: Not At Risk (06/09/2023)  Alcohol Screen: Low Risk  (05/17/2022)  Depression (PHQ2-9): Low Risk  (02/14/2023)  Financial Resource Strain: Low Risk  (05/17/2022)  Physical Activity: Insufficiently Active (05/17/2022)  Social Connections: Moderately Integrated (09/06/2022)  Stress: No Stress  Concern Present (05/17/2022)  Tobacco Use: Low Risk  (06/09/2023)   SDOH Interventions:     Readmission Risk Interventions     No data to display

## 2023-06-12 ENCOUNTER — Telehealth: Payer: Self-pay

## 2023-06-12 NOTE — Transitions of Care (Post Inpatient/ED Visit) (Unsigned)
   06/12/2023  Name: [name] MRN: [id number] DOB: 09/24/1931  Today's TOC FU Call Status: Today's TOC FU Call Status:: Unsuccessful Call (1st Attempt) Unsuccessful Call (1st Attempt) Date: 06/12/23  Attempted to reach the patient regarding the most recent Inpatient/ED visit.  Follow Up Plan: Additional outreach attempts will be made to reach the patient to complete the Transitions of Care (Post Inpatient/ED visit) call.   Signature Karena Addison, LPN [facility] Nurse Health Advisor Direct Dial [phone]

## 2023-06-12 NOTE — Progress Notes (Unsigned)
[facility]: Clinic Note  Subjective:  History of Present Illness: [name] is a 89 y.o. year old female who presents for hospital follow up. She was admitted from 8/17-8/19 to the Cardiology service for symptomatic bradycardia.  She presented with syncope, was found to have sinus bradycardia, then recurrence of Aflutter, then bradycardia. Dr Graciela Husbands placed Abbott single PPM on 06/10/23.  Stopped home losartan Held home aldactone  Resume eliquis Thursday  Do foot exam if time   Please refer to Assessment and Plan below for full details in Problem-Based Charting.   Past Medical History:  Patient Active Problem List   Diagnosis Date Noted   Symptomatic bradycardia 06/09/2023   Healthcare maintenance 02/14/2023   Hypercalcemia 05/17/2022   CAD (coronary artery disease) 04/05/2022   Bradycardia    Atrial flutter (HCC)    (HFpEF) heart failure with preserved ejection fraction (HCC) 03/19/2022   Stage 3a chronic kidney disease (CKD) (HCC) 03/17/2022   Hypothyroidism 03/17/2022   Severe aortic stenosis s/p TAVR 04/22/2019   Essential hypertension 11/19/2013   Type II diabetes mellitus (HCC) 03/05/2013   Hyperlipidemia       Medications:  Current Outpatient Medications:    acetaminophen (TYLENOL) 500 MG tablet, Take 500 mg by mouth every 6 (six) hours as needed for mild pain, moderate pain or headache. , Disp: , Rfl:    amLODipine (NORVASC) 10 MG tablet, Take 1 tablet (10 mg total) by mouth daily., Disp: 90 tablet, Rfl: 3   apixaban (ELIQUIS) 5 MG TABS tablet, Take 1 tablet (5 mg total) by mouth 2 (two) times daily. Overdue for follow-up, MUST see MD for FUTURE refills., Disp: 180 tablet, Rfl: 3   empagliflozin (JARDIANCE) 25 MG TABS tablet, Take 1 tablet (25 mg total) by mouth daily before breakfast., Disp: 90 tablet, Rfl: 3   latanoprost (XALATAN) 0.005 % ophthalmic solution, Place 1 drop into both eyes at bedtime., Disp: , Rfl:    levothyroxine  (SYNTHROID) 100 MCG tablet, Take 1 tablet (100 mcg total) by mouth daily., Disp: 90 tablet, Rfl: 3   rosuvastatin (CRESTOR) 10 MG tablet, Take 1 tablet (10 mg total) by mouth every other day., Disp: 45 tablet, Rfl: 3   spironolactone (ALDACTONE) 25 MG tablet, Take 1 tablet (25 mg total) by mouth daily., Disp: 90 tablet, Rfl: 3   terbinafine (LAMISIL) 250 MG tablet, Take 250 mg by mouth daily., Disp: , Rfl:    timolol (TIMOPTIC) 0.5 % ophthalmic solution, Place 1 drop into both eyes every morning., Disp: , Rfl:    Allergies: Allergies  Allergen Reactions   Lisinopril     Angioneurotic edema       Objective:   Vitals: There were no vitals filed for this visit.   Physical Exam: Physical Exam   Data: Labs, imaging, and micro were reviewed in Epic. Refer to Assessment and Plan below for full details in Problem-Based Charting.  Assessment & Plan:  No problem-specific Assessment & Plan notes found for this encounter.     Patient will follow up in ***  Mercie Eon, MD

## 2023-06-13 ENCOUNTER — Encounter: Payer: Self-pay | Admitting: Internal Medicine

## 2023-06-13 ENCOUNTER — Ambulatory Visit (INDEPENDENT_AMBULATORY_CARE_PROVIDER_SITE_OTHER): Payer: Medicare Other | Admitting: Internal Medicine

## 2023-06-13 ENCOUNTER — Encounter: Payer: Medicare Other | Admitting: Internal Medicine

## 2023-06-13 ENCOUNTER — Ambulatory Visit: Payer: Medicare Other | Attending: Physician Assistant

## 2023-06-13 ENCOUNTER — Ambulatory Visit (INDEPENDENT_AMBULATORY_CARE_PROVIDER_SITE_OTHER): Payer: Medicare Other

## 2023-06-13 VITALS — BP 144/72 | HR 83 | Temp 97.5°F | Ht 64.0 in | Wt 182.9 lb

## 2023-06-13 DIAGNOSIS — Z7984 Long term (current) use of oral hypoglycemic drugs: Secondary | ICD-10-CM

## 2023-06-13 DIAGNOSIS — E119 Type 2 diabetes mellitus without complications: Secondary | ICD-10-CM

## 2023-06-13 DIAGNOSIS — I5032 Chronic diastolic (congestive) heart failure: Secondary | ICD-10-CM

## 2023-06-13 DIAGNOSIS — I484 Atypical atrial flutter: Secondary | ICD-10-CM

## 2023-06-13 DIAGNOSIS — I130 Hypertensive heart and chronic kidney disease with heart failure and stage 1 through stage 4 chronic kidney disease, or unspecified chronic kidney disease: Secondary | ICD-10-CM

## 2023-06-13 DIAGNOSIS — Z0000 Encounter for general adult medical examination without abnormal findings: Secondary | ICD-10-CM

## 2023-06-13 DIAGNOSIS — Z950 Presence of cardiac pacemaker: Secondary | ICD-10-CM

## 2023-06-13 DIAGNOSIS — I350 Nonrheumatic aortic (valve) stenosis: Secondary | ICD-10-CM

## 2023-06-13 DIAGNOSIS — N1831 Chronic kidney disease, stage 3a: Secondary | ICD-10-CM

## 2023-06-13 DIAGNOSIS — N179 Acute kidney failure, unspecified: Secondary | ICD-10-CM

## 2023-06-13 DIAGNOSIS — E785 Hyperlipidemia, unspecified: Secondary | ICD-10-CM | POA: Diagnosis not present

## 2023-06-13 DIAGNOSIS — E7800 Pure hypercholesterolemia, unspecified: Secondary | ICD-10-CM

## 2023-06-13 DIAGNOSIS — I2510 Atherosclerotic heart disease of native coronary artery without angina pectoris: Secondary | ICD-10-CM

## 2023-06-13 DIAGNOSIS — I10 Essential (primary) hypertension: Secondary | ICD-10-CM

## 2023-06-13 DIAGNOSIS — E039 Hypothyroidism, unspecified: Secondary | ICD-10-CM

## 2023-06-13 LAB — LIPOPROTEIN A (LPA): Lipoprotein (a): 57.3 nmol/L — ABNORMAL HIGH (ref <75.0)

## 2023-06-13 NOTE — Assessment & Plan Note (Addendum)
-   chronic and stable - continue levothyroxine daily - TSH today

## 2023-06-13 NOTE — Assessment & Plan Note (Signed)
-   patient developed AKI in setting of bradycardia and probably hypotension - BMP today
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
## 2023-06-13 NOTE — Assessment & Plan Note (Signed)
-   chronic and stable - she is euvolemic today - she follows with cardiology  - BMP, then maybe restart spironolactone, as above - jardiance 10mg  daily, as above

## 2023-06-13 NOTE — Patient Instructions (Signed)
Dear Ms Leman,  It was a pleasure seeing you in clinic today.  I am so glad you are doing well after your recent hospitalization.  You may start taking your blood thinner (Eliquis) tomorrow morning, as planned.  I will call you with the results of your labs, probably tomorrow, but definitely before the weekend. Don't take your Spironolactone until I call you to review your labs.  I'll see you back in January. Please call if you need anything before then.  Sincerely, Dr. Mercie Eon

## 2023-06-13 NOTE — Assessment & Plan Note (Signed)
-   chronic & stable - continue SBE ppx prn

## 2023-06-13 NOTE — Assessment & Plan Note (Signed)
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

## 2023-06-13 NOTE — Transitions of Care (Post Inpatient/ED Visit) (Signed)
   06/13/2023  Name: [name] MRN: [id number] DOB: 12-Apr-1931  Today's TOC FU Call Status: Today's TOC FU Call Status:: Unsuccessful Call (1st Attempt) Unsuccessful Call (1st Attempt) Date: 06/12/23  Attempted to reach the patient regarding the most recent Inpatient/ED visit.  Follow Up Plan: No further outreach attempts will be made at this time. We have been unable to contact the patient. Patient has already been seen [facility]  Karena Addison, LPN [facility] Nurse Health Advisor Direct Dial [phone]

## 2023-06-13 NOTE — Assessment & Plan Note (Signed)
-   She had Abbott Single pacemaker placed on 06/10/23 with Dr. Graciela Husbands at [facility] for symptomatic sinus bradycardia with syncope & recurrent of Aflutter - She is doing great today, incision is healing well - Follow up with cardiology later today

## 2023-06-13 NOTE — Progress Notes (Signed)
Normal lipoprotein a, this is a blood work obtained in the hospital during recent hospitalization

## 2023-06-13 NOTE — Assessment & Plan Note (Addendum)
-   chronic and stable, A1C<7 in 05/2023 - continue jardiance 10mg  daily - foot exam done today

## 2023-06-13 NOTE — Assessment & Plan Note (Signed)
>>  ASSESSMENT AND PLAN FOR (HFPEF) HEART FAILURE WITH PRESERVED EJECTION FRACTION (HCC) WRITTEN ON 06/13/2023  9:36 AM BY MACHEN, JULIE, MD  - chronic and stable - she is euvolemic today - she follows with cardiology  - BMP, then maybe restart spironolactone, as above - jardiance 10mg  daily, as above

## 2023-06-13 NOTE — Assessment & Plan Note (Signed)
-   I encouraged her to get her flu shot this fall

## 2023-06-13 NOTE — Assessment & Plan Note (Addendum)
-   chronic and stable - continue amlodipine 10mg  daily - She had hypercalcemia on hydrochlorothiazide, she had angioedema with ACE, and bradycardia on beta blocker (now s/p PPM) - BMP today - if GFR improved, I will restart her Spironolactone 25mg  daily

## 2023-06-13 NOTE — Assessment & Plan Note (Signed)
-   continue eliquis 5mg  BID - She is not on a beta blocker

## 2023-06-13 NOTE — Assessment & Plan Note (Addendum)
-   chronic and stable - LDL <70 - Continue Rosuvastatin 10mg  every other day

## 2023-06-14 LAB — BMP8+ANION GAP
Anion Gap: 19 mmol/L — ABNORMAL HIGH (ref 10.0–18.0)
BUN/Creatinine Ratio: 25 (ref 12–28)
BUN: 32 mg/dL (ref 10–36)
CO2: 18 mmol/L — ABNORMAL LOW (ref 20–29)
Calcium: 9.5 mg/dL (ref 8.7–10.3)
Chloride: 100 mmol/L (ref 96–106)
Creatinine, Ser: 1.27 mg/dL — ABNORMAL HIGH (ref 0.57–1.00)
Glucose: 118 mg/dL — ABNORMAL HIGH (ref 70–99)
Potassium: 4.6 mmol/L (ref 3.5–5.2)
Sodium: 137 mmol/L (ref 134–144)
eGFR: 40 mL/min/{1.73_m2} — ABNORMAL LOW (ref >59)

## 2023-06-14 LAB — TSH: TSH: 3.25 u[IU]/mL (ref 0.450–4.500)

## 2023-06-18 ENCOUNTER — Telehealth: Payer: Self-pay

## 2023-06-18 NOTE — Telephone Encounter (Signed)
Pt's requesting lab results and wants to know if the doctor is planning to put her back on Spironolactone.

## 2023-06-18 NOTE — Telephone Encounter (Signed)
Requesting to speak with a nurse about medication. Also requesting lab results, please call pt back.

## 2023-06-18 NOTE — Telephone Encounter (Signed)
Called patient to review labs. Labs look excellent - she should restart spironolactone.  I called both numbers listed and no answer, no option to leave voicemail.  Will try again this afternoon.   RN's, if she calls back, please tell her labs were normal and she should restart her spironolactone

## 2023-06-20 ENCOUNTER — Ambulatory Visit: Payer: Medicare Other | Attending: Internal Medicine

## 2023-06-20 DIAGNOSIS — I442 Atrioventricular block, complete: Secondary | ICD-10-CM | POA: Diagnosis not present

## 2023-06-20 LAB — CUP PACEART INCLINIC DEVICE CHECK
Battery Remaining Longevity: 100 mo
Battery Voltage: 3.04 V
Brady Statistic RV Percent Paced: 63 %
Date Time Interrogation Session: 20240828193806
Implantable Lead Connection Status: 753985
Implantable Lead Implant Date: 20240818
Implantable Lead Location: 753860
Implantable Pulse Generator Implant Date: 20240818
Lead Channel Impedance Value: 600 Ohm
Lead Channel Pacing Threshold Amplitude: 0.75 V
Lead Channel Pacing Threshold Amplitude: 0.75 V
Lead Channel Pacing Threshold Pulse Width: 0.5 ms
Lead Channel Pacing Threshold Pulse Width: 0.5 ms
Lead Channel Sensing Intrinsic Amplitude: 12 mV
Lead Channel Setting Pacing Amplitude: 3.5 V
Lead Channel Setting Pacing Pulse Width: 0.5 ms
Lead Channel Setting Sensing Sensitivity: 2 mV
Pulse Gen Model: 1272
Pulse Gen Serial Number: 8169073

## 2023-06-20 NOTE — Progress Notes (Signed)
Wound check appointment. Dermabond removed. Wound without redness or edema. Incision edges approximated with 1 small area that is still open at distal end of incision, 2 steri strips applied, wound care instructions given. Patient to call if area does not continue to heal.  Normal device function. Thresholds, sensing, and impedances consistent with implant measurements. Device programmed at 3.5V/auto capture programmed on for extra safety margin until 3 month visit. Histogram distribution appropriate for patient and level of activity. No mode switches or high ventricular rates noted. Patient educated about wound care, arm mobility, lifting restrictions. ROV in 3 months with implanting physician.

## 2023-06-20 NOTE — Patient Instructions (Addendum)
    After Your Pacemaker   Monitor your pacemaker site for redness, swelling, and drainage. Call the device clinic at [phone] if you experience these symptoms or fever/chills.  There is still one area of your wound that is healing.  We have placed a small steri strip to reinforce the area.  Do not shower for the next three days, then resume normal shower using normal body wash.  Clean area with clean washcloth and towel washing area first.  Let strips fall off on their own.  Monitor for signs of infection (redness, swelling, new bleeding), if any concerns call us directly at device clinic number above.   You may use a hot tub or a pool after your wound check appointment if the incision is completely closed.  Do not lift, push or pull greater than 10 pounds with the affected arm until 6 weeks after your procedure. UNTIL AFTER SEPTEMBER 29TH.  There are no other restrictions in arm movement after your wound check appointment.  You may drive, unless driving has been restricted by your healthcare providers.   Remote monitoring is used to monitor your pacemaker from home. This monitoring is scheduled every 91 days by our office. It allows [place] to keep an eye on the functioning of your device to ensure it is working properly. You will routinely see your Electrophysiologist annually (more often if necessary).

## 2023-07-11 ENCOUNTER — Ambulatory Visit (HOSPITAL_COMMUNITY): Payer: Medicare Other | Attending: Cardiovascular Disease

## 2023-07-11 DIAGNOSIS — Z952 Presence of prosthetic heart valve: Secondary | ICD-10-CM | POA: Insufficient documentation

## 2023-07-11 LAB — ECHOCARDIOGRAM COMPLETE
AR max vel: 1.91 cm2
AV Area VTI: 1.69 cm2
AV Area mean vel: 1.54 cm2
AV Mean grad: 5 mmHg
AV Peak grad: 8.8 mmHg
Ao pk vel: 1.48 m/s
Area-P 1/2: 5.84 cm2
MV M vel: 5.18 m/s
MV Peak grad: 107.2 mmHg
S' Lateral: 3.5 cm

## 2023-07-16 DIAGNOSIS — I447 Left bundle-branch block, unspecified: Secondary | ICD-10-CM | POA: Insufficient documentation

## 2023-07-16 DIAGNOSIS — R001 Bradycardia, unspecified: Secondary | ICD-10-CM | POA: Insufficient documentation

## 2023-07-16 NOTE — Progress Notes (Deleted)
Cardiology Office Note:    Date:  07/16/2023  ID:  [name], DOB 03/25/1931, MRN [id number] PCP: Mercie Eon, MD  [facility] Providers Cardiologist:  Tonny Bollman, MD Cardiology APP:  Beatrice Lecher, PA-C { Click to update primary MD,subspecialty MD or APP then REFRESH:1}    {Click to Open Review  :1}   Patient Profile:      Aortic stenosis S/p TAVR 03/2019 TTE 03/20/22: Septal and basal inferior HK, EF 50-55, normal RVSF, moderately elevated PASP, severe LAE, mild MR, mild to moderate TR, s/p TAVR (mean gradient 6 mmHg, trivial PVL), RVSP 58.6  TTE 06/09/23: EF 55-60, mild MR, TAVR gradient 5, mild TR TTE 07/11/23: EF 40-45, no RWMA, mild LVH, mildly reduced RVSF, severe LAE, mild RAE, mod MR, mild to mod TR, TAVR w mean 5, no AI Paroxysmal AFib/Flutter  CHADS2-VASc=5 (HTN, diab, female, age x 2) >> Apixaban AFlutter noted in May 2023 during admit for CHF  Rate control  Coronary artery disease (mild to mod nonobs by cath in 03/2019) LHC 03/24/19: OM2 30, RCA 50 Myoview 05/09/21: EF 67, normal perfusion, low risk  (HFpEF) heart failure with preserved ejection fraction  EF ? to 40-45 on TTE in 06/2023 Complete heart block s/p sgl chamber pacemaker 05/2023  Hypertension Hyperlipidemia Diabetes mellitus Angioedema 2/2 ACE inhibitor (Lisinopril)  Left Bundle Branch Block  Echo 7/22: EF 55-60 Myoview 7/22 low risk         {      :1}   History of Present Illness:  Discussed the use of AI scribe software for clinical note transcription with the patient, who gave verbal consent to proceed.   [name] is a 89 y.o. female who returns for follow up of CHF, CAD, AFib. She was last seen by Dr. Excell Seltzer in 12/2022. She was admitted in 05/2023 with syncope in the setting of symptomatic bradycardia. She underwent PPM implantation. Follow up TTE ordered at her last visit was done on 07/11/23 and demonstrated reduced LVF with EF 40-45, mod MR, mild to mod TR and normally  functioning TAVR.   History of Present Illness            ROS: ***    Studies Reviewed:       *** Risk Assessment/Calculations:   {Does this patient have ATRIAL FIBRILLATION?[phone]} No BP recorded.  {Refresh Note OR Click here to enter BP  :1}***       Physical Exam:   VS:  LMP  (LMP Unknown)    Wt Readings from Last 3 Encounters:  06/13/23 182 lb 14.4 oz (83 kg)  06/13/23 182 lb 14.4 oz (83 kg)  06/09/23 178 lb 9.2 oz (81 kg)    Physical Exam***     Assessment and Plan:  No problem-specific Assessment & Plan notes found for this encounter. Assessment and Plan             {  Atrial flutter (HCC) She failed cardioversion but then spontaneously converted to sinus rhythm.  However, she was noted to be back in atrial flutter when she followed up in the atrial fibrillation clinic.  She prefers to avoid antiarrhythmic drug therapy or repeat cardioversion.  As long as she remains stable, I think that this is reasonable.  Continue Eliquis 5 mg twice daily.  Follow-up in 6 months.   Severe aortic stenosis s/p TAVR Normally functioning TAVR by echocardiogram in May 2023.  Continue SBE prophylaxis.   (HFpEF) heart failure with preserved  ejection fraction (HCC) Overall, volume status stable.  NYHA II-IIb.  Her heart rate remains controlled in atrial flutter.  As long as she does not have worsening issues with heart failure control, we can likely pursue a rate control strategy.  Continue HCTZ 25 mg daily, losartan 50 mg daily.  She is not a candidate for Entresto given history of angioedema with ACE inhibitors.  At this point, I do not think she requires treatment with MRA or SGLT2 inhibitor.  Follow-up in 6 months.   CAD (coronary artery disease) Nonobstructive disease by cardiac catheterization in 2020.  Low risk Myoview in July 2022.  She is not having anginal symptoms.  Continue Crestor 10 mg every other day.   Essential hypertension Blood pressure above target.  I have  asked her to monitor blood pressure at home and send some readings for review.  If her blood pressure remains above target, increase losartan to 100 mg daily.    :1}    {Are you ordering a CV Procedure (e.g. stress test, cath, DCCV, TEE, etc)?   Press F2        :[id number]}  Dispo:  No follow-ups on file.  Signed, Tereso Newcomer, PA-C

## 2023-07-17 ENCOUNTER — Ambulatory Visit: Payer: Medicare Other | Admitting: Physician Assistant

## 2023-07-17 DIAGNOSIS — I350 Nonrheumatic aortic (valve) stenosis: Secondary | ICD-10-CM

## 2023-07-17 DIAGNOSIS — I484 Atypical atrial flutter: Secondary | ICD-10-CM

## 2023-07-17 DIAGNOSIS — I447 Left bundle-branch block, unspecified: Secondary | ICD-10-CM

## 2023-07-17 DIAGNOSIS — I5022 Chronic systolic (congestive) heart failure: Secondary | ICD-10-CM

## 2023-07-17 DIAGNOSIS — I2510 Atherosclerotic heart disease of native coronary artery without angina pectoris: Secondary | ICD-10-CM

## 2023-07-17 DIAGNOSIS — R001 Bradycardia, unspecified: Secondary | ICD-10-CM

## 2023-08-20 NOTE — Progress Notes (Unsigned)
Cardiology Office Note:    Date:  08/21/2023  ID:  [name], DOB April 14, 1931, MRN [id number] PCP: Mercie Eon, MD  [facility] Providers Cardiologist:  Tonny Bollman, MD Cardiology APP:  Beatrice Lecher, PA-C       Patient Profile:      Aortic stenosis S/p TAVR 03/2019 TTE 03/20/22: Septal and basal inferior HK, EF 50-55, normal RVSF, moderately elevated PASP, severe LAE, mild MR, mild to moderate TR, s/p TAVR (mean gradient 6 mmHg, trivial PVL), RVSP 58.6  TTE 06/09/23: EF 55-60, no RWMA, mild LVH, NL RVSF, mild pulm HTN, RVSP 37.1, small eff, mild MR, TAVR NL structure and fxn, mean AV 5 mmHg, RAP 3 TTE 07/11/23: EF 40-45, no RWMA, mild LVH, mildly reduced RVSF, severe LAE, mild RAE, mod MR, mild to mod TR, TAVR ok w mean 5 mmHg  Paroxysmal AFib  Apixaban Rx AFlutter noted in May 2023 during admit for CHF - failed DCCV Pt declined rhythm control strategy Coronary artery disease (mild to mod nonobs by cath in 03/2019) Cath 03/24/19: OM2 30, RCA 50 Myoview 05/09/21: EF 67, normal perfusion, low risk  HFmrEF (heart failure with mildly reduced ejection fraction)  Previous HFpEF (TTE in 05/2023: EF 55-60) TTE 06/2023: EF 40-45 >> Plan Med Rx given adv age Symptomatic bradycardia s/p Pacemaker 05/2023 Hypertension Hyperlipidemia Diabetes mellitus Chronic kidney disease  Angioedema 2/2 ACE inhibitor (Lisinopril)  Left Bundle Branch Block  Echo 7/22: EF 55-60 Myoview 7/22 low risk          History of Present Illness:  Discussed the use of AI scribe software for clinical note transcription with the patient, who gave verbal consent to proceed.  [name] is a 89 y.o. female who returns for follow up of CHF, valvular heart disease, CAD. She was last seen by Dr. Excell Seltzer in 12/2022. She was admitted in August 2024 with symptomatic bradycardia.  She presented with syncope and required temporary pacer.  She underwent implantation of single-chamber pacemaker device.   During her admission, she had an echocardiogram which demonstrated normal ejection fraction and normal TAVR function.  She had another echocardiogram in September which was planned after her last visit in the office.  This demonstrated reduced LV function with an EF of 40-45.  TAVR function remain normal.  Dr. Excell Seltzer recommended medical therapy given advanced age.   She is here with her daughter.  She reports feeling tired, particularly during housework, over the past one to two months. She denies any shortness of breath, chest discomfort, or leg swelling. The patient also denies any recent fevers, chills, cough, vomiting, diarrhea, bloody bowel movements, or bloody urine.     Review of Systems  Gastrointestinal:  Negative for hematochezia and melena.  Genitourinary:  Negative for hematuria.  See HPI     Studies Reviewed:        Results   LABS - Chart Review LDL: 64 (07/11/2023) Hb: 14.6 (07/11/2023) Cr: 1.27 (07/11/2023) K: 4.6 (07/11/2023) ALT: 26 (07/11/2023)      Risk Assessment/Calculations:    CHA2DS2-VASc Score = 6   This indicates a 9.7% annual risk of stroke. The patient's score is based upon: CHF History: 0 HTN History: 1 Diabetes History: 1 Stroke History: 0 Vascular Disease History: 1 Age Score: 2 Gender Score: 1           Physical Exam:   VS:  BP (!) 141/70   Pulse 84   Ht 5\' 4"  (1.626 m)  Wt 181 lb (82.1 kg)   LMP  (LMP Unknown)   SpO2 95%   BMI 31.07 kg/m    Wt Readings from Last 3 Encounters:  08/21/23 181 lb (82.1 kg)  06/13/23 182 lb 14.4 oz (83 kg)  06/13/23 182 lb 14.4 oz (83 kg)    Constitutional:      Appearance: Healthy appearance. Not in distress.  Neck:     Vascular: JVD normal.  Pulmonary:     Breath sounds: Normal breath sounds. No wheezing. No rales.  Cardiovascular:     Normal rate. Regular rhythm.     Murmurs: There is no murmur.  Edema:    Peripheral edema absent.  Abdominal:     Palpations: Abdomen is soft.         Assessment and Plan:   Assessment & Plan Heart failure with mildly reduced ejection fraction (HFmrEF) (HCC) EF previously 55-60 in August 2024.  Recent echo in September 2024 with EF 40-45.  She did undergo pacemaker implantation in August.  Question if this may be playing some role.  She does note fatigue.  However, volume status is currently stable.  She is NYHA IIb.   -Continue Jardiance 25mg  daily and Spironolactone 25mg  daily.  -I did consider adding beta blocker therapy.  However given her advanced age and symptoms of fatigue, we will hold off for now. -She has a true allergy to ACE inhibitor -Order CBC and basic metabolic panel to rule out anemia or other causes of fatigue. -Follow-up 6 months -I will reach out to Dr. Graciela Husbands to see if there are any potential issues with her pacemaker Coronary artery disease involving native coronary artery of native heart without angina pectoris Mild to moderate nonobstructive disease on cardiac catheterization in 2020.  She is not having chest pain to suggest angina.  Continue Crestor 10 mg daily. Severe aortic stenosis s/p TAVR Recent echocardiogram September 2024 with normal valve function.  Continue SBE prophylaxis. Atypical atrial flutter (HCC) She is tolerating anticoagulation.  Most recent hemoglobin and creatinine normal.  Based upon creatinine and weight, continue Eliquis 5 mg twice daily. Symptomatic bradycardia Status post pacemaker in August 2024.  Follow-up with EP as planned. Stage 3a chronic kidney disease (CKD) (HCC) GFR 40.  Most recent creatinine 1.27. Essential hypertension Borderline control.  Given her advanced age, I would except a blood pressure <150/90.  Continue amlodipine 10 mg daily, spironolactone 25 mg daily.       Dispo:  Return in about 6 months (around 02/19/2024) for Routine Follow Up, w/ Dr. Excell Seltzer.  Signed, Tereso Newcomer, PA-C

## 2023-08-21 ENCOUNTER — Ambulatory Visit: Payer: Medicare Other | Attending: Physician Assistant | Admitting: Physician Assistant

## 2023-08-21 ENCOUNTER — Encounter: Payer: Self-pay | Admitting: Physician Assistant

## 2023-08-21 VITALS — BP 141/70 | HR 84 | Ht 64.0 in | Wt 181.0 lb

## 2023-08-21 DIAGNOSIS — I484 Atypical atrial flutter: Secondary | ICD-10-CM

## 2023-08-21 DIAGNOSIS — N1831 Chronic kidney disease, stage 3a: Secondary | ICD-10-CM

## 2023-08-21 DIAGNOSIS — I5022 Chronic systolic (congestive) heart failure: Secondary | ICD-10-CM | POA: Diagnosis not present

## 2023-08-21 DIAGNOSIS — R001 Bradycardia, unspecified: Secondary | ICD-10-CM | POA: Diagnosis not present

## 2023-08-21 DIAGNOSIS — I2510 Atherosclerotic heart disease of native coronary artery without angina pectoris: Secondary | ICD-10-CM

## 2023-08-21 DIAGNOSIS — I10 Essential (primary) hypertension: Secondary | ICD-10-CM | POA: Diagnosis not present

## 2023-08-21 DIAGNOSIS — I350 Nonrheumatic aortic (valve) stenosis: Secondary | ICD-10-CM | POA: Diagnosis not present

## 2023-08-21 NOTE — Assessment & Plan Note (Signed)
She is tolerating anticoagulation.  Most recent hemoglobin and creatinine normal.  Based upon creatinine and weight, continue Eliquis 5 mg twice daily.

## 2023-08-21 NOTE — Assessment & Plan Note (Signed)
Status post pacemaker in August 2024.  Follow-up with EP as planned.

## 2023-08-21 NOTE — Assessment & Plan Note (Signed)
GFR 40.  Most recent creatinine 1.27.

## 2023-08-21 NOTE — Patient Instructions (Signed)
Medication Instructions:  Your physician recommends that you continue on your current medications as directed. Please refer to the Current Medication list given to you today.  *If you need a refill on your cardiac medications before your next appointment, please call your pharmacy*   Lab Work: TODAY:  BMET & CBC  If you have labs (blood work) drawn today and your tests are completely normal, you will receive your results only by: MyChart Message (if you have MyChart) OR A paper copy in the mail If you have any lab test that is abnormal or we need to change your treatment, we will call you to review the results.   Testing/Procedures: None ordered   Follow-Up: At [facility], you and your health needs are our priority.  As part of our continuing mission to provide you with exceptional heart care, we have created designated Provider Care Teams.  These Care Teams include your primary Cardiologist (physician) and Advanced Practice Providers (APPs -  Physician Assistants and Nurse Practitioners) who all work together to provide you with the care you need, when you need it.  We recommend signing up for the patient portal called "MyChart".  Sign up information is provided on this After Visit Summary.  MyChart is used to connect with patients for Virtual Visits (Telemedicine).  Patients are able to view lab/test results, encounter notes, upcoming appointments, etc.  Non-urgent messages can be sent to your provider as well.   To learn more about what you can do with MyChart, go to [email or web address].    Your next appointment:   6 month(s)  Provider:   Tonny Bollman, MD     Other Instructions

## 2023-08-21 NOTE — Assessment & Plan Note (Signed)
Borderline control.  Given her advanced age, I would except a blood pressure <150/90.  Continue amlodipine 10 mg daily, spironolactone 25 mg daily.

## 2023-08-21 NOTE — Assessment & Plan Note (Signed)
Recent echocardiogram September 2024 with normal valve function.  Continue SBE prophylaxis.

## 2023-08-21 NOTE — Assessment & Plan Note (Signed)
Mild to moderate nonobstructive disease on cardiac catheterization in 2020.  She is not having chest pain to suggest angina.  Continue Crestor 10 mg daily.

## 2023-08-21 NOTE — Assessment & Plan Note (Signed)
EF previously 55-60 in August 2024.  Recent echo in September 2024 with EF 40-45.  She did undergo pacemaker implantation in August.  Question if this may be playing some role.  She does note fatigue.  However, volume status is currently stable.  She is NYHA IIb.   -Continue Jardiance 25mg  daily and Spironolactone 25mg  daily.  -I did consider adding beta blocker therapy.  However given her advanced age and symptoms of fatigue, we will hold off for now. -She has a true allergy to ACE inhibitor -Order CBC and basic metabolic panel to rule out anemia or other causes of fatigue. -Follow-up 6 months -I will reach out to Dr. Graciela Husbands to see if there are any potential issues with her pacemaker

## 2023-08-22 LAB — BASIC METABOLIC PANEL
BUN/Creatinine Ratio: 17 (ref 12–28)
BUN: 22 mg/dL (ref 10–36)
CO2: 23 mmol/L (ref 20–29)
Calcium: 10 mg/dL (ref 8.7–10.3)
Chloride: 103 mmol/L (ref 96–106)
Creatinine, Ser: 1.3 mg/dL — ABNORMAL HIGH (ref 0.57–1.00)
Glucose: 114 mg/dL — ABNORMAL HIGH (ref 70–99)
Potassium: 4.7 mmol/L (ref 3.5–5.2)
Sodium: 141 mmol/L (ref 134–144)
eGFR: 39 mL/min/{1.73_m2} — ABNORMAL LOW (ref >59)

## 2023-08-22 LAB — CBC
Hematocrit: 49.6 % — ABNORMAL HIGH (ref 34.0–46.6)
Hemoglobin: 15.9 g/dL (ref 11.1–15.9)
MCH: 29.9 pg (ref 26.6–33.0)
MCHC: 32.1 g/dL (ref 31.5–35.7)
MCV: 93 fL (ref 79–97)
Platelets: 197 10*3/uL (ref 150–450)
RBC: 5.31 x10E6/uL — ABNORMAL HIGH (ref 3.77–5.28)
RDW: 12.6 % (ref 11.7–15.4)
WBC: 4.7 10*3/uL (ref 3.4–10.8)

## 2023-08-22 NOTE — Progress Notes (Signed)
Pt has been made aware of normal result and verbalized understanding.  jw

## 2023-09-12 ENCOUNTER — Encounter: Payer: Medicare Other | Admitting: Internal Medicine

## 2023-09-19 DIAGNOSIS — L603 Nail dystrophy: Secondary | ICD-10-CM | POA: Diagnosis not present

## 2023-09-19 DIAGNOSIS — E1151 Type 2 diabetes mellitus with diabetic peripheral angiopathy without gangrene: Secondary | ICD-10-CM | POA: Diagnosis not present

## 2023-09-19 DIAGNOSIS — E1142 Type 2 diabetes mellitus with diabetic polyneuropathy: Secondary | ICD-10-CM | POA: Diagnosis not present

## 2023-09-19 DIAGNOSIS — I739 Peripheral vascular disease, unspecified: Secondary | ICD-10-CM | POA: Diagnosis not present

## 2023-09-19 DIAGNOSIS — L84 Corns and callosities: Secondary | ICD-10-CM | POA: Diagnosis not present

## 2023-09-24 ENCOUNTER — Telehealth: Payer: Self-pay | Admitting: *Deleted

## 2023-09-24 DIAGNOSIS — I5021 Acute systolic (congestive) heart failure: Secondary | ICD-10-CM

## 2023-09-24 NOTE — Telephone Encounter (Signed)
Call placed to pt regarding message below.  She is aware we will place the order and someone will reach out to her to get it scheduled.

## 2023-09-24 NOTE — Telephone Encounter (Signed)
-----   Message from Tereso Newcomer sent at 09/24/2023  1:34 PM EST ----- Regarding: needs Echo Discussed pt with Dr. Graciela Husbands. Patient's EF noted to be lower on echocardiogram after pacemaker implantation. It would be unusual for low EF to occur this soon after pacemaker implant.  He recommends repeating an echocardiogram to recheck ejection fraction. PLAN: -Arrange Echocardiogram -  Dx: HFmrEF (heart failure with mildly reduced ejection fraction)  -Pt has appt with Dr. Graciela Husbands 12/17 - can it be done before then?? Tereso Newcomer, PA-C    09/24/2023 1:35 PM

## 2023-09-25 ENCOUNTER — Other Ambulatory Visit: Payer: Self-pay | Admitting: Physician Assistant

## 2023-09-25 ENCOUNTER — Ambulatory Visit (HOSPITAL_COMMUNITY)
Admission: RE | Admit: 2023-09-25 | Discharge: 2023-09-25 | Disposition: A | Discharge status: Home / Self Care | Payer: Medicare Other | Source: Ambulatory Visit | Attending: Cardiology | Admitting: Cardiology

## 2023-09-25 DIAGNOSIS — I5021 Acute systolic (congestive) heart failure: Secondary | ICD-10-CM

## 2023-09-25 LAB — ECHOCARDIOGRAM LIMITED
AR max vel: 0.64 cm2
AV Area VTI: 0.57 cm2
AV Area mean vel: 0.64 cm2
AV Mean grad: 5 mm[Hg]
AV Peak grad: 9.1 mm[Hg]
Ao pk vel: 1.51 m/s
Area-P 1/2: 4.49 cm2
S' Lateral: 4 cm

## 2023-09-27 ENCOUNTER — Telehealth: Payer: Self-pay

## 2023-09-27 ENCOUNTER — Telehealth: Payer: Self-pay | Admitting: Physician Assistant

## 2023-09-27 NOTE — Telephone Encounter (Signed)
Spoke to patients daughter who advised her questions were answered from phone call from this am. Appreciative of call.

## 2023-09-27 NOTE — Telephone Encounter (Signed)
Alesia Banda - daugther [phone]    Pts daughter on DPR advised her Echo results and will keep her OV with Dr Graciela Husbands 10/09/23.. I will send to device in the meantime if anything they can check prior to her OV.

## 2023-09-27 NOTE — Telephone Encounter (Signed)
I gave the patient verbal instructions on how to send a manual transmission.

## 2023-09-27 NOTE — Telephone Encounter (Signed)
  Pt's daughter calling to discuss echo result and she have questions regarding pt's PPM

## 2023-09-27 NOTE — Telephone Encounter (Signed)
I called the [employer] tech support with the pt to get additional help. [facility] ordered her a piece for her monitor.

## 2023-10-09 ENCOUNTER — Ambulatory Visit (INDEPENDENT_AMBULATORY_CARE_PROVIDER_SITE_OTHER): Payer: Medicare Other

## 2023-10-09 ENCOUNTER — Ambulatory Visit: Payer: Medicare Other | Attending: Internal Medicine | Admitting: Internal Medicine

## 2023-10-09 ENCOUNTER — Encounter: Payer: Self-pay | Admitting: Internal Medicine

## 2023-10-09 VITALS — BP 114/72 | HR 87 | Ht 64.0 in | Wt 181.6 lb

## 2023-10-09 DIAGNOSIS — R42 Dizziness and giddiness: Secondary | ICD-10-CM | POA: Diagnosis not present

## 2023-10-09 DIAGNOSIS — Z950 Presence of cardiac pacemaker: Secondary | ICD-10-CM

## 2023-10-09 DIAGNOSIS — R001 Bradycardia, unspecified: Secondary | ICD-10-CM

## 2023-10-09 LAB — CUP PACEART INCLINIC DEVICE CHECK
Date Time Interrogation Session: 20241217143243
Implantable Lead Connection Status: 753985
Implantable Lead Implant Date: 20240818
Implantable Lead Location: 753860
Implantable Pulse Generator Implant Date: 20240818
Pulse Gen Model: 1272
Pulse Gen Serial Number: 8168073

## 2023-10-09 NOTE — Patient Instructions (Signed)
Medication Instructions:  Your physician recommends that you continue on your current medications as directed. Please refer to the Current Medication list given to you today.  *If you need a refill on your cardiac medications before your next appointment, please call your pharmacy*   Lab Work: None ordered.  If you have labs (blood work) drawn today and your tests are completely normal, you will receive your results only by: MyChart Message (if you have MyChart) OR A paper copy in the mail If you have any lab test that is abnormal or we need to change your treatment, we will call you to review the results.   Testing/Procedures: None ordered.    Follow-Up: At [facility], you and your health needs are our priority.  As part of our continuing mission to provide you with exceptional heart care, we have created designated Provider Care Teams.  These Care Teams include your primary Cardiologist (physician) and Advanced Practice Providers (APPs -  Physician Assistants and Nurse Practitioners) who all work together to provide you with the care you need, when you need it.  We recommend signing up for the patient portal called "MyChart".  Sign up information is provided on this After Visit Summary.  MyChart is used to connect with patients for Virtual Visits (Telemedicine).  Patients are able to view lab/test results, encounter notes, upcoming appointments, etc.  Non-urgent messages can be sent to your provider as well.   To learn more about what you can do with MyChart, go to [email or web address].    Your next appointment:   12 months with Dr Graciela Husbands  Other Instructions ZIO XT- Long Term Monitor Instructions  Your physician has requested you wear a ZIO patch monitor for 14 days.  This is a single patch monitor. [employer] supplies one patch monitor per enrollment. Additional stickers are not available. Please do not apply patch if you will be having a Nuclear Stress Test,   Echocardiogram, Cardiac CT, MRI, or Chest Xray during the period you would be wearing the  monitor. The patch cannot be worn during these tests. You cannot remove and re-apply the  ZIO XT patch monitor.  Your ZIO patch monitor will be mailed 3 day USPS to your address on file. It may take 3-5 days  to receive your monitor after you have been enrolled.  Once you have received your monitor, please review the enclosed instructions. Your monitor  has already been registered assigning a specific monitor serial # to you.  Billing and Patient Assistance Program Information  We have supplied [employer] with any of your insurance information on file for billing purposes. [employer] offers a sliding scale Patient Assistance Program for patients that do not have  insurance, or whose insurance does not completely cover the cost of the ZIO monitor.  You must apply for the Patient Assistance Program to qualify for this discounted rate.  To apply, please call [employer] at [phone], select option 4, select option 2, ask to apply for  Patient Assistance Program. Meredeth Ide will ask your household income, and how many people  are in your household. They will quote your out-of-pocket cost based on that information.  [employer] will also be able to set up a 12-month, interest-free payment plan if needed.  Applying the monitor   Shave hair from upper left chest.  Hold abrader disc by orange tab. Rub abrader in 40 strokes over the upper left chest as  indicated in your monitor instructions.  Clean area with 4 enclosed alcohol pads.  Let dry.  Apply patch as indicated in monitor instructions. Patch will be placed under collarbone on left  side of chest with arrow pointing upward.  Rub patch adhesive wings for 2 minutes. Remove white label marked "1". Remove the white  label marked "2". Rub patch adhesive wings for 2 additional minutes.  While looking in a mirror, press and release button in center of patch. A small  green light will  flash 3-4 times. This will be your only indicator that the monitor has been turned on.  Do not shower for the first 24 hours. You may shower after the first 24 hours.  Press the button if you feel a symptom. You will hear a small click. Record Date, Time and  Symptom in the Patient Logbook.  When you are ready to remove the patch, follow instructions on the last 2 pages of Patient  Logbook. Stick patch monitor onto the last page of Patient Logbook.  Place Patient Logbook in the blue and white box. Use locking tab on box and tape box closed  securely. The blue and white box has prepaid postage on it. Please place it in the mailbox as  soon as possible. Your physician should have your test results approximately 7 days after the  monitor has been mailed back to [facility].  Call [facility] Customer Care at [phone] if you have questions regarding  your ZIO XT patch monitor. Call them immediately if you see an orange light blinking on your  monitor.  If your monitor falls off in less than 4 days, contact our Monitor department at [phone].  If your monitor becomes loose or falls off after 4 days call [employer] at [phone] for  suggestions on securing your monitor
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
## 2023-10-09 NOTE — Progress Notes (Unsigned)
Enrolled patient for a 14 day Zio XT  monitor to be mailed to patients home  °

## 2023-10-17 DIAGNOSIS — R42 Dizziness and giddiness: Secondary | ICD-10-CM | POA: Diagnosis not present

## 2023-11-13 DIAGNOSIS — R42 Dizziness and giddiness: Secondary | ICD-10-CM | POA: Diagnosis not present

## 2023-11-14 ENCOUNTER — Encounter: Payer: Self-pay | Admitting: *Deleted

## 2023-11-20 NOTE — Progress Notes (Unsigned)
[facility]: Clinic Note  Subjective:  History of Present Illness: [name] is a 89 y.o. year old female who presents for routine follow up of her chronic medical conditions. I saw her in 06/13/2023, right after she had her pacemaker placed.   When she saw cardiology in December, she was having lightheadedness with exertion. Cardiology thought she may have pacemaker syndrome, made some adjustments to the device, and ordered Zio patch. TTE in 09/2023 showed decreased LVEF to 35-40%.   Since her December visit with Cardiology, she has not had any chest pain, shortness of breath, dizziness with exertion, lightheadedness, or falls.   She has no concerns today. Is taking all medicines and took her BP meds this morning, including spironolactone.    Please refer to Assessment and Plan below for full details in Problem-Based Charting.   Past Medical History:  Patient Active Problem List   Diagnosis Date Noted   Symptomatic bradycardia 07/16/2023   LBBB (left bundle branch block) 07/16/2023   Pacemaker 06/09/2023   Healthcare maintenance 02/14/2023   CAD (coronary artery disease) 04/05/2022   Atrial flutter (HCC)    Heart failure with mildly reduced ejection fraction (HFmrEF) (HCC) 03/19/2022   Stage 3a chronic kidney disease (CKD) (HCC) 03/17/2022   Hypothyroidism 03/17/2022   Severe aortic stenosis s/p TAVR 04/22/2019   Essential hypertension 11/19/2013   Type II diabetes mellitus (HCC) 03/05/2013   Hyperlipidemia       Medications:  Current Outpatient Medications:    acetaminophen (TYLENOL) 500 MG tablet, Take 500 mg by mouth every 6 (six) hours as needed for mild pain, moderate pain or headache. , Disp: , Rfl:    amLODipine (NORVASC) 10 MG tablet, Take 1 tablet (10 mg total) by mouth daily., Disp: 90 tablet, Rfl: 3   apixaban (ELIQUIS) 5 MG TABS tablet, Take 1 tablet (5 mg total) by mouth 2 (two) times daily. Overdue for follow-up, MUST see MD for  FUTURE refills., Disp: 180 tablet, Rfl: 3   brimonidine (ALPHAGAN) 0.2 % ophthalmic solution, Place 1 drop into both eyes every morning., Disp: , Rfl:    empagliflozin (JARDIANCE) 25 MG TABS tablet, Take 1 tablet (25 mg total) by mouth daily before breakfast., Disp: 90 tablet, Rfl: 3   latanoprost (XALATAN) 0.005 % ophthalmic solution, Place 1 drop into both eyes at bedtime., Disp: , Rfl:    levothyroxine (SYNTHROID) 100 MCG tablet, Take 1 tablet (100 mcg total) by mouth daily., Disp: 90 tablet, Rfl: 3   rosuvastatin (CRESTOR) 10 MG tablet, Take 1 tablet (10 mg total) by mouth every other day., Disp: 45 tablet, Rfl: 3   spironolactone (ALDACTONE) 25 MG tablet, Take 1 tablet (25 mg total) by mouth daily., Disp: 90 tablet, Rfl: 3   Allergies: Allergies  Allergen Reactions   Lisinopril     Angioneurotic edema       Objective:   Vitals: Vitals:   11/21/23 0906 11/21/23 0926  BP: (!) 160/104 128/70  Pulse: 87 85  Temp: 97.6 F (36.4 C)   SpO2: 100%      Physical Exam: Physical Exam Constitutional:      Appearance: Normal appearance.  Cardiovascular:     Rate and Rhythm: Normal rate and regular rhythm.  Pulmonary:     Effort: Pulmonary effort is normal.     Breath sounds: Normal breath sounds.  Musculoskeletal:     Right lower leg: No edema.     Left lower leg: No edema.  Skin:  General: Skin is warm and dry.  Neurological:     Mental Status: She is alert.      Data: Labs, imaging, and micro were reviewed in Epic. Refer to Assessment and Plan below for full details in Problem-Based Charting.  Assessment & Plan:  Heart failure with mildly reduced ejection fraction (HFmrEF) (HCC) - LVEF decreased from 55-60% in 05/2023 to 35-40% in 09/2023 after pacemaker placement in August. She is following closely with Cardiology and with the device clinic - She is euvolemic and feeling well today - Defer beta blocker to avoid fatigue in this 89yo woman  - Has angioedema to ACE, so  no ACE/ARB for her - Continue Jardiance 10mg  daily & Spironolactone 25mg  daily  Atrial flutter (HCC) - Today, she is in normal sinus rhythm - Continue Eliquis 5mg  BID  Pacemaker - She had Abbott Single pacemaker placed on 06/10/23 with Dr. Graciela Husbands at [facility] for symptomatic sinus bradycardia with syncope & recurrent of Aflutter - Dr Graciela Husbands was wondering if her symptoms of lightheadedness could be from pacemaker syndrome in 09/2023, so made some adjustments, and she's doing a Ziopatch. I don't fully understand what adjustments were made, but she is feeling better, so I defer to his judgement and we'll keep the course  CAD (coronary artery disease) - Nonobstructive CAD found on LHC in 2020 - Rosuvastatin 10mg  every other day - not on aspirin since she's on eliquis  Type II diabetes mellitus (HCC) - chronic and stable, A1C<7 in 05/2023 - continue jardiance 10mg  daily  Essential hypertension - chronic and stable - continue amlodipine 10mg  daily & spironolactone 25mg  daily - I had picked Amlodipine before the HFrEF diagnosis, but since she can't take an ACE/ARB, and this regimen is doing well in this 89yo woman, I'd like to keep it  Stage 3a chronic kidney disease (CKD) (HCC) - chronic and stable - continue Jardiance 10mg  daily - BMP next visit  Hypothyroidism - chronic and stable - continue levothyroxine daily - TSH today  Severe aortic stenosis s/p TAVR - chronic & stable - continue SBE ppx prn  Healthcare maintenance - She is up to date - Flu & covid boosters updated in epic today     Patient will follow up in 6 months with me. She is due to see Cardiology next month. At next visit, will check BMP, TSH and follow up LVEF plan with Cardiology.  Mercie Eon, MD

## 2023-11-21 ENCOUNTER — Encounter: Payer: Self-pay | Admitting: Internal Medicine

## 2023-11-21 ENCOUNTER — Ambulatory Visit (INDEPENDENT_AMBULATORY_CARE_PROVIDER_SITE_OTHER): Payer: 59 | Admitting: Internal Medicine

## 2023-11-21 VITALS — BP 128/70 | HR 85 | Temp 97.6°F | Ht 64.0 in | Wt 181.0 lb

## 2023-11-21 DIAGNOSIS — E1122 Type 2 diabetes mellitus with diabetic chronic kidney disease: Secondary | ICD-10-CM

## 2023-11-21 DIAGNOSIS — E039 Hypothyroidism, unspecified: Secondary | ICD-10-CM

## 2023-11-21 DIAGNOSIS — I350 Nonrheumatic aortic (valve) stenosis: Secondary | ICD-10-CM

## 2023-11-21 DIAGNOSIS — E119 Type 2 diabetes mellitus without complications: Secondary | ICD-10-CM

## 2023-11-21 DIAGNOSIS — I5022 Chronic systolic (congestive) heart failure: Secondary | ICD-10-CM

## 2023-11-21 DIAGNOSIS — Z950 Presence of cardiac pacemaker: Secondary | ICD-10-CM | POA: Diagnosis not present

## 2023-11-21 DIAGNOSIS — N1831 Chronic kidney disease, stage 3a: Secondary | ICD-10-CM

## 2023-11-21 DIAGNOSIS — Z7984 Long term (current) use of oral hypoglycemic drugs: Secondary | ICD-10-CM | POA: Diagnosis not present

## 2023-11-21 DIAGNOSIS — I130 Hypertensive heart and chronic kidney disease with heart failure and stage 1 through stage 4 chronic kidney disease, or unspecified chronic kidney disease: Secondary | ICD-10-CM

## 2023-11-21 DIAGNOSIS — I484 Atypical atrial flutter: Secondary | ICD-10-CM | POA: Diagnosis not present

## 2023-11-21 DIAGNOSIS — Z0000 Encounter for general adult medical examination without abnormal findings: Secondary | ICD-10-CM

## 2023-11-21 DIAGNOSIS — I2510 Atherosclerotic heart disease of native coronary artery without angina pectoris: Secondary | ICD-10-CM

## 2023-11-21 DIAGNOSIS — I10 Essential (primary) hypertension: Secondary | ICD-10-CM

## 2023-11-21 NOTE — Assessment & Plan Note (Signed)
-   chronic and stable - continue amlodipine 10mg  daily & spironolactone 25mg  daily - I had picked Amlodipine before the HFrEF diagnosis, but since she can't take an ACE/ARB, and this regimen is doing well in this 89yo woman, I'd like to keep it

## 2023-11-21 NOTE — Patient Instructions (Addendum)
Thank you, [name] for allowing [place] to provide your care today. Today we discussed your blood pressure, your heart, and your vaccines. You are doing great! I am not making any big changes today.    I have ordered the following labs for you:  Lab Orders  No laboratory test(s) ordered today     Tests ordered today:  None  Referrals ordered today:   Referral Orders  No referral(s) requested today     I have ordered the following medication/changed the following medications:   Stop the following medications: There are no discontinued medications.   Start the following medications: No orders of the defined types were placed in this encounter.    Return in about 6 months (around 05/20/2024) for Chronic medical conditions.  We'll check your labs then.  Remember:  - Check your blood pressure once a week at home. If you are consistently high (>160s), please call to let me know. - If you have any dizziness, falls, or if your home blood pressures are low (<110s), please call to let me know.  Should you have any questions or concerns please call the internal medicine clinic at [phone].     Mercie Eon, MD Faculty, Internal Medicine Teaching Progam [facility]

## 2023-11-21 NOTE — Assessment & Plan Note (Signed)
-  Nonobstructive CAD found on LHC in 2020 - Rosuvastatin 10mg  every other day - not on aspirin since she's on eliquis

## 2023-11-21 NOTE — Assessment & Plan Note (Signed)
-   chronic and stable - continue Jardiance 10mg  daily - BMP next visit

## 2023-11-21 NOTE — Assessment & Plan Note (Signed)
-  chronic and stable - continue levothyroxine daily - TSH today

## 2023-11-21 NOTE — Assessment & Plan Note (Signed)
-   LVEF decreased from 55-60% in 05/2023 to 35-40% in 09/2023 after pacemaker placement in August. She is following closely with Cardiology and with the device clinic - She is euvolemic and feeling well today - Defer beta blocker to avoid fatigue in this 89yo woman  - Has angioedema to ACE, so no ACE/ARB for her - Continue Jardiance 10mg  daily & Spironolactone 25mg  daily

## 2023-11-21 NOTE — Assessment & Plan Note (Signed)
-   She had Abbott Single pacemaker placed on 06/10/23 with Dr. Graciela Husbands at [facility] for symptomatic sinus bradycardia with syncope & recurrent of Aflutter - Dr Graciela Husbands was wondering if her symptoms of lightheadedness could be from pacemaker syndrome in 09/2023, so made some adjustments, and she's doing a Ziopatch. I don't fully understand what adjustments were made, but she is feeling better, so I defer to his judgement and we'll keep the course

## 2023-11-21 NOTE — Assessment & Plan Note (Signed)
-   She is up to date - Flu & covid boosters updated in epic today

## 2023-11-21 NOTE — Assessment & Plan Note (Signed)
-   Today, she is in normal sinus rhythm - Continue Eliquis 5mg  BID

## 2023-11-21 NOTE — Assessment & Plan Note (Signed)
-   chronic and stable, A1C<7 in 05/2023 - continue jardiance 10mg  daily

## 2023-11-21 NOTE — Assessment & Plan Note (Signed)
-  chronic & stable - continue SBE ppx prn

## 2023-11-26 DIAGNOSIS — H401132 Primary open-angle glaucoma, bilateral, moderate stage: Secondary | ICD-10-CM | POA: Diagnosis not present

## 2023-11-26 DIAGNOSIS — H52223 Regular astigmatism, bilateral: Secondary | ICD-10-CM | POA: Diagnosis not present

## 2023-12-03 ENCOUNTER — Other Ambulatory Visit: Payer: Self-pay

## 2023-12-03 DIAGNOSIS — I5032 Chronic diastolic (congestive) heart failure: Secondary | ICD-10-CM

## 2023-12-03 DIAGNOSIS — I10 Essential (primary) hypertension: Secondary | ICD-10-CM

## 2023-12-03 MED ORDER — SPIRONOLACTONE 25 MG PO TABS: 25.0000 mg | ORAL_TABLET | Freq: Every day | ORAL | 3 refills | Status: DC

## 2023-12-03 NOTE — Telephone Encounter (Signed)
 Medication sent to pharmacy

## 2023-12-10 ENCOUNTER — Ambulatory Visit (INDEPENDENT_AMBULATORY_CARE_PROVIDER_SITE_OTHER): Payer: Medicare Other

## 2023-12-10 DIAGNOSIS — I442 Atrioventricular block, complete: Secondary | ICD-10-CM | POA: Diagnosis not present

## 2023-12-11 LAB — CUP PACEART REMOTE DEVICE CHECK
Battery Remaining Longevity: 129 mo
Battery Remaining Percentage: 95.5 %
Battery Voltage: 3.02 V
Brady Statistic RV Percent Paced: 24 %
Date Time Interrogation Session: 20250217020019
Lead Channel Impedance Value: 580 Ohm
Lead Channel Pacing Threshold Amplitude: 0.75 V
Lead Channel Pacing Threshold Pulse Width: 0.5 ms
Lead Channel Sensing Intrinsic Amplitude: 12 mV
Lead Channel Setting Pacing Amplitude: 2.5 V
Lead Channel Setting Pacing Pulse Width: 0.5 ms
Lead Channel Setting Sensing Sensitivity: 2 mV
Pulse Gen Model: 1272
Pulse Gen Serial Number: 8169073

## 2023-12-12 ENCOUNTER — Encounter: Payer: Medicare Other | Admitting: Internal Medicine

## 2023-12-19 DIAGNOSIS — L84 Corns and callosities: Secondary | ICD-10-CM | POA: Diagnosis not present

## 2023-12-19 DIAGNOSIS — E1142 Type 2 diabetes mellitus with diabetic polyneuropathy: Secondary | ICD-10-CM | POA: Diagnosis not present

## 2023-12-19 DIAGNOSIS — E1151 Type 2 diabetes mellitus with diabetic peripheral angiopathy without gangrene: Secondary | ICD-10-CM | POA: Diagnosis not present

## 2023-12-19 DIAGNOSIS — I739 Peripheral vascular disease, unspecified: Secondary | ICD-10-CM | POA: Diagnosis not present

## 2023-12-19 DIAGNOSIS — L603 Nail dystrophy: Secondary | ICD-10-CM | POA: Diagnosis not present

## 2023-12-27 ENCOUNTER — Other Ambulatory Visit: Payer: Self-pay | Admitting: Internal Medicine

## 2023-12-27 DIAGNOSIS — I10 Essential (primary) hypertension: Secondary | ICD-10-CM

## 2023-12-27 DIAGNOSIS — I5032 Chronic diastolic (congestive) heart failure: Secondary | ICD-10-CM

## 2023-12-27 NOTE — Telephone Encounter (Signed)
 Copied from CRM [id number]. Topic: Clinical - Medication Refill >> Dec 27, 2023  4:02 PM Everette Rank wrote: Most Recent Primary Care Visit:  Provider: Mercie Eon  Department: [facility]  Visit Type: OPEN ESTABLISHED  Date: 11/21/2023  Medication: amLODipine (NORVASC) 10 MG tablet spironolactone (ALDACTONE) 25 MG tablet  Has the patient contacted their pharmacy? Yes (Agent: If no, request that the patient contact the pharmacy for the refill. If patient does not wish to contact the pharmacy document the reason why and proceed with request.) (Agent: If yes, when and what did the pharmacy advise?)  Is this the correct pharmacy for this prescription? Yes If no, delete pharmacy and type the correct one.  This is the patient's preferred pharmacy:  [facility] - [place], [place] - [address] [place] [address] Phone: [phone] Fax: [phone]   Has the prescription been filled recently? No  Is the patient out of the medication? Yes  Has the patient been seen for an appointment in the last year OR does the patient have an upcoming appointment? Yes  Can we respond through MyChart? No  Agent: Please be advised that Rx refills may take up to 3 business days. We ask that you follow-up with your pharmacy.

## 2023-12-28 MED ORDER — SPIRONOLACTONE 25 MG PO TABS: 25.0000 mg | ORAL_TABLET | Freq: Every day | ORAL | 2 refills | Status: AC

## 2023-12-28 MED ORDER — AMLODIPINE BESYLATE 10 MG PO TABS: 10.0000 mg | ORAL_TABLET | Freq: Every day | ORAL | 2 refills | Status: DC

## 2024-01-15 NOTE — Addendum Note (Signed)
 Addended by: Geralyn Flash D on: 01/15/2024 01:56 PM   Modules accepted: Orders

## 2024-01-15 NOTE — Progress Notes (Signed)
 Remote pacemaker transmission.

## 2024-02-07 ENCOUNTER — Other Ambulatory Visit: Payer: Self-pay

## 2024-02-07 DIAGNOSIS — I10 Essential (primary) hypertension: Secondary | ICD-10-CM

## 2024-02-07 DIAGNOSIS — I5032 Chronic diastolic (congestive) heart failure: Secondary | ICD-10-CM

## 2024-02-07 MED ORDER — LEVOTHYROXINE SODIUM 100 MCG PO TABS: 100.0000 ug | ORAL_TABLET | Freq: Every day | ORAL | 3 refills | Status: AC

## 2024-02-07 NOTE — Telephone Encounter (Signed)
 Medication sent to pharmacy

## 2024-02-14 ENCOUNTER — Encounter: Payer: Self-pay | Admitting: Cardiovascular Disease

## 2024-02-14 ENCOUNTER — Ambulatory Visit: Attending: Cardiovascular Disease | Admitting: Cardiovascular Disease

## 2024-02-14 VITALS — BP 146/75 | HR 83 | Ht 64.0 in | Wt 185.8 lb

## 2024-02-14 DIAGNOSIS — I484 Atypical atrial flutter: Secondary | ICD-10-CM

## 2024-02-14 DIAGNOSIS — Z952 Presence of prosthetic heart valve: Secondary | ICD-10-CM | POA: Diagnosis not present

## 2024-02-14 DIAGNOSIS — I10 Essential (primary) hypertension: Secondary | ICD-10-CM

## 2024-02-14 DIAGNOSIS — I2510 Atherosclerotic heart disease of native coronary artery without angina pectoris: Secondary | ICD-10-CM

## 2024-02-14 DIAGNOSIS — I442 Atrioventricular block, complete: Secondary | ICD-10-CM

## 2024-02-14 DIAGNOSIS — I5022 Chronic systolic (congestive) heart failure: Secondary | ICD-10-CM | POA: Diagnosis not present

## 2024-02-14 NOTE — Assessment & Plan Note (Signed)
 I repeated her blood pressure and got a reading of 146/75 mmHg.  I looked over the various office visits and her blood pressure is often in normal range.  Age 89, I think her blood pressure control is acceptable and we will continue current doses of amlodipine  and spironolactone .  She has a true ACE inhibitor allergy.

## 2024-02-14 NOTE — Assessment & Plan Note (Addendum)
 Last assessment of LVEF 40 to 45%.  NYHA functional class II symptoms present.

## 2024-02-14 NOTE — Assessment & Plan Note (Signed)
 Patient underwent cardiac catheterization in 2020 demonstrating nonobstructive CAD.  She is treated with rosuvastatin .

## 2024-02-14 NOTE — Patient Instructions (Signed)
 Follow-Up: At [facility], you and your health needs are our priority.  As part of our continuing mission to provide you with exceptional heart care, our providers are all part of one team.  This team includes your primary Cardiologist (physician) and Advanced Practice Providers or APPs (Physician Assistants and Nurse Practitioners) who all work together to provide you with the care you need, when you need it.  Your next appointment:   1 year(s)  Provider:   Tonny Bollman, MD        1st Floor: - Lobby - Registration  - Pharmacy  - Lab - Cafe  2nd Floor: - PV Lab - Diagnostic Testing (echo, CT, nuclear med)  3rd Floor: - Vacant  4th Floor: - TCTS (cardiothoracic surgery) - AFib Clinic - Structural Heart Clinic - Vascular Surgery  - Vascular Ultrasound  5th Floor: - HeartCare Cardiology (general and EP) - Clinical Pharmacy for coumadin, hypertension, lipid, weight-loss medications, and med management appointments    Valet parking services will be available as well.

## 2024-02-14 NOTE — Assessment & Plan Note (Addendum)
 Anticoagulated with apixaban .  Stable at present.

## 2024-02-14 NOTE — Progress Notes (Signed)
 Cardiology Office Note:    Date:  02/14/2024   ID:  [name], DOB [birth year], MRN [id number]  PCP:  Driscilla George, MD   [facility] Providers Cardiologist:  Arnoldo Lapping, MD Cardiology APP:  [facility]     Referring MD: Driscilla George, MD   Chief Complaint  Patient presents with   Follow-up    S/P TAVR    History of Present Illness:    [name] is a 89 y.o. female with a hx of:  Aortic stenosis S/p TAVR 03/2019 TTE 03/20/22: Septal and basal inferior HK, EF 50-55, normal RVSF, moderately elevated PASP, severe LAE, mild MR, mild to moderate TR, s/p TAVR (mean gradient 6 mmHg, trivial PVL), RVSP 58.6  TTE 06/09/23: EF 55-60, no RWMA, mild LVH, NL RVSF, mild pulm HTN, RVSP 37.1, small eff, mild MR, TAVR NL structure and fxn, mean AV 5 mmHg, RAP 3 TTE 07/11/23: EF 40-45, no RWMA, mild LVH, mildly reduced RVSF, severe LAE, mild RAE, mod MR, mild to mod TR, TAVR ok w mean 5 mmHg  Paroxysmal AFib  Apixaban  Rx AFlutter noted in May 2023 during admit for CHF - failed DCCV Pt declined rhythm control strategy Coronary artery disease (mild to mod nonobs by cath in 03/2019) Cath 03/24/19: OM2 30, RCA 50 Myoview  05/09/21: EF 67, normal perfusion, low risk  HFmrEF (heart failure with mildly reduced ejection fraction)  Previous HFpEF (TTE in 05/2023: EF 55-60) TTE 06/2023: EF 40-45 >> Plan Med Rx given adv age 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  She has been doing remarkably well.  She still gets out and washes her car at age 6.  She likes to work in her garden.  She has mild shortness of breath with activity.  No chest pain, chest pressure, leg edema, lightheadedness, heart palpitations, or syncope.  She is  compliant with her medications.  Current Medications: Current Meds  Medication Sig   acetaminophen  (TYLENOL ) 500 MG tablet Take 500 mg by mouth every 6 (six) hours as needed for mild pain, moderate pain or headache.    amLODipine  (NORVASC ) 10 MG tablet Take 1 tablet (10 mg total) by mouth daily.   apixaban  (ELIQUIS ) 5 MG TABS tablet Take 1 tablet (5 mg total) by mouth 2 (two) times daily. Overdue for follow-up, MUST see MD for FUTURE refills.   brimonidine (ALPHAGAN) 0.2 % ophthalmic solution Place 1 drop into both eyes every morning.   empagliflozin  (JARDIANCE ) 25 MG TABS tablet Take 1 tablet (25 mg total) by mouth daily before breakfast.   latanoprost  (XALATAN ) 0.005 % ophthalmic solution Place 1 drop into both eyes at bedtime.   levothyroxine  (SYNTHROID ) 100 MCG tablet Take 1 tablet (100 mcg total) by mouth daily.   spironolactone  (ALDACTONE ) 25 MG tablet Take 1 tablet (25 mg total) by mouth daily.     Allergies:   Lisinopril    ROS:   Please see the history of present illness.    All other systems reviewed and are negative.  EKGs/Labs/Other Studies Reviewed:    The following studies were reviewed today: Cardiac Studies & Procedures   ______________________________________________________________________________________________ CARDIAC CATHETERIZATION  CARDIAC CATHETERIZATION 06/09/2023  Conclusion 1.  Successful right IJ temporary pacemaker placement with an threshold of 0.3 mA, an output of  5 mA, and a backup rate of 60 bpm.  Recommendation: EP consultation and follow-up chest x-ray.  The results were discussed with Dr. Rodolfo Clan.   CARDIAC CATHETERIZATION  CARDIAC CATHETERIZATION 03/24/2019  Conclusion 1.  Mild nonobstructive coronary artery disease with 40 to 50% stenosis in the proximal RCA, diffuse irregularity of the LAD, and minor diffuse irregularity of the left circumflex 2.  Calcified, restricted aortic valve with mean transvalvular gradient 31 mmHg, peak to peak  gradient 46 mmHg 3.  Normal right heart hemodynamics  Plan: Severe calcific aortic stenosis, continued medical therapy for nonobstructive CAD, continued multidisciplinary evaluation for treatment of severe aortic stenosis.  Findings Coronary Findings Diagnostic  Dominance: Right  Left Anterior Descending There is mild diffuse disease throughout the vessel.  First Septal Branch There is mild disease in the vessel.  Left Circumflex  Second Obtuse Marginal Branch Ost 2nd Mrg to 2nd Mrg lesion is 30% stenosed.  Right Coronary Artery Prox RCA lesion is 50% stenosed. 40-50% focal stenosis  Intervention  No interventions have been documented.   STRESS TESTS  MYOCARDIAL PERFUSION IMAGING 05/09/2021  Narrative  The left ventricular ejection fraction is hyperdynamic (>65%).  Nuclear stress EF: 67%.  There was no ST segment deviation noted during stress.  The study is normal.  This is a low risk study.   ECHOCARDIOGRAM  ECHOCARDIOGRAM LIMITED 09/25/2023  Narrative ECHOCARDIOGRAM LIMITED REPORT    Patient Name:   [name] Date of Exam: 09/25/2023 Medical Rec #:  [id number]           Height:       64.0 in Accession #:    [id number]          Weight:       181.0 lb Date of Birth:  Jan 29, 1931           BSA:          1.875 m Patient Age:    89 years            BP:           141/70 mmHg Patient Gender: F                   HR:           84 bpm. Exam Location:  Outpatient  Procedure: 3D Echo, Cardiac Doppler, Limited Echo, 2D Echo and Color Doppler  Indications:    Acute heart failure with mildly reduced ejection fraction (HFmrEF, 41-49%) (HCC) [I50.21 (ICD-10-CM)]  History:        Patient has prior history of Echocardiogram examinations, most recent 07/11/2023. Aortic Valve Disease and Mitral Valve Disease. Aortic Valve: 23 mm Edwards Sapien prosthetic, stented (TAVR) valve is present in the aortic position. Procedure Date: 04/22/2019.  Sonographer:     Delford Felling MHA, RDMS, RVT, RDCS Referring Phys: [id number] Awilda Bogus WEAVER   Sonographer Comments: Global longitudinal strain was attempted. IMPRESSIONS   1. Left ventricular ejection fraction, by estimation, is 35 to 40%. Left ventricular ejection fraction by 3D volume is 33 %. The left ventricle has moderately decreased function. The left ventricle demonstrates global hypokinesis. There is mild concentric left ventricular hypertrophy. The average left ventricular global longitudinal strain is -2.2 %. The global longitudinal strain is abnormal. 2. Right ventricular systolic function is normal. The right ventricular size is normal. 3. The mitral valve is normal in structure. Mild mitral valve regurgitation. No evidence of mitral stenosis. 4. Tricuspid valve regurgitation is mild to moderate. 5. The  aortic valve has been repaired/replaced. Aortic valve regurgitation is not visualized. No aortic stenosis is present. There is a 23 mm Edwards Sapien prosthetic (TAVR) valve present in the aortic position. Procedure Date: 04/22/2019. Aortic valve mean gradient measures 5.0 mmHg. Aortic valve Vmax measures 1.51 m/s. 6. The inferior vena cava is normal in size with greater than 50% respiratory variability, suggesting right atrial pressure of 3 mmHg.  FINDINGS Left Ventricle: Left ventricular ejection fraction, by estimation, is 35 to 40%. Left ventricular ejection fraction by 3D volume is 33 %. The left ventricle has moderately decreased function. The left ventricle demonstrates global hypokinesis. The average left ventricular global longitudinal strain is -2.2 %. The global longitudinal strain is abnormal. The left ventricular internal cavity size was normal in size. There is mild concentric left ventricular hypertrophy.  Right Ventricle: The right ventricular size is normal. No increase in right ventricular wall thickness. Right ventricular systolic function is normal.  Left Atrium: Left atrial size  was normal in size.  Right Atrium: Right atrial size was normal in size.  Pericardium: There is no evidence of pericardial effusion.  Mitral Valve: The mitral valve is normal in structure. Mild mitral valve regurgitation. No evidence of mitral valve stenosis.  Tricuspid Valve: The tricuspid valve is normal in structure. Tricuspid valve regurgitation is mild to moderate. No evidence of tricuspid stenosis.  Aortic Valve: The aortic valve has been repaired/replaced. Aortic valve regurgitation is not visualized. No aortic stenosis is present. Aortic valve mean gradient measures 5.0 mmHg. Aortic valve peak gradient measures 9.1 mmHg. Aortic valve area, by VTI measures 0.57 cm. There is a 23 mm Edwards Sapien prosthetic, stented (TAVR) valve present in the aortic position. Procedure Date: 04/22/2019.  Pulmonic Valve: The pulmonic valve was normal in structure. Pulmonic valve regurgitation is not visualized. No evidence of pulmonic stenosis.  Aorta: The aortic root is normal in size and structure.  Venous: The inferior vena cava is normal in size with greater than 50% respiratory variability, suggesting right atrial pressure of 3 mmHg.  IAS/Shunts: No atrial level shunt detected by color flow Doppler.  Additional Comments: Spectral Doppler performed. Color Doppler performed.  LEFT VENTRICLE PLAX 2D LVIDd:         4.50 cm LVIDs:         4.00 cm         2D LV PW:         1.29 cm         Longitudinal LV IVS:        1.15 cm         Strain LVOT diam:     1.49 cm         2D Strain GLS  -2.2 % LV SV:         16              Avg: LV SV Index:   9 LVOT Area:     1.74 cm        3D Volume EF LV 3D EF:    Left ventricul ar ejection fraction by 3D volume is 33 %.  3D Volume EF: 3D EF:        33 % LV EDV:       110 ml LV ESV:       74 ml LV SV:        36 ml  LEFT ATRIUM         Index LA diam:    4.26 cm 2.27  cm/m AORTIC VALVE AV Area (Vmax):    0.64 cm AV Area (Vmean):   0.64  cm AV Area (VTI):     0.57 cm AV Vmax:           151.00 cm/s AV Vmean:          101.000 cm/s AV VTI:            0.285 m AV Peak Grad:      9.1 mmHg AV Mean Grad:      5.0 mmHg LVOT Vmax:         55.50 cm/s LVOT Vmean:        37.100 cm/s LVOT VTI:          0.094 m LVOT/AV VTI ratio: 0.33  AORTA Ao Asc diam: 3.01 cm  MITRAL VALVE MV Area (PHT): 4.49 cm    SHUNTS MV Decel Time: 169 msec    Systemic VTI:  0.09 m MV E velocity: 77.00 cm/s  Systemic Diam: 1.49 cm MV A velocity: 58.70 cm/s MV E/A ratio:  1.31  Kardie Tobb DO Electronically signed by Jerryl Morin DO Signature Date/Time: 09/25/2023/7:38:17 PM    Final    MONITORS  LONG TERM MONITOR (3-14 DAYS) 11/13/2023  Narrative Patch Wear Time:  14 days and 5 hours (2024-12-25T17:12:34-0500 to 2025-01-13T16:13:38-0500)  Monitor 1 Atrial Flutter occurred continuously (100% burden), ranging from 49-127 bpm (avg of 74 bpm). Bundle Branch Block/IVCD was present. Isolated VEs were occasional (1.4%, 344), VE Couplets were rare (<1.0%, 10), and no VE Triplets were present. Ventricular Bigeminy was present.  Monitor 2 Atrial Flutter occurred continuously (100% burden), ranging from 46-141 bpm (avg of 71 bpm). Atrial Flutter may be possible Atrial Tachycardia with variable block. Bundle Branch Block/IVCD was present. Isolated VEs were rare (<1.0%), VE Couplets were rare (<1.0%), and no VE Triplets were present. Ventricular Trigeminy was present.  Triggered events assoc with more rapid atrail flutter  The patient is noted to be in atrial flutter Note from 2023>> prefers to avoid cardioversion or antiarrhythmic drugs Has followup to be scehduled 4/25 with Dr Alwyn Baas; if she is doing ok clinically we can let her wiat. O/w perhaps can arrange either Afib clinic or EP APP for follwoup-- some of her atrial flutter is quite rapid   CT SCANS  CT CORONARY MORPH W/CTA COR W/SCORE 03/31/2019  Addendum 03/31/2019  2:02 PM ADDENDUM REPORT:  03/31/2019 14:00  EXAM: OVER-READ INTERPRETATION  CT CHEST  The following report is an over-read performed by radiologist Dr. Babs Bolster [facility]: [facility] on 03/31/2019. This over-read does not include interpretation of cardiac or coronary anatomy or pathology. The coronary calcium  score and cardiac CTA interpretation by the cardiologist is attached.  COMPARISON:  None.  FINDINGS: Extracardiac findings will be described separately under dictation for contemporaneously obtained CTA chest, abdomen and pelvis.  IMPRESSION: Please see separate dictation for contemporaneously obtained CTA chest, abdomen and pelvis 03/31/2019 for full description of relevant extracardiac findings.   Electronically Signed By: Alexandria Angel M.D. On: 03/31/2019 14:00  Narrative CLINICAL DATA:  89 year old female with severe aortic stenosis being evaluated for a TAVR procedure.  EXAM: Cardiac TAVR CT  TECHNIQUE: The patient was scanned on a [employer]. A 120 kV retrospective scan was triggered in the descending thoracic aorta at 111 HU's. Gantry rotation speed was 250 msecs and collimation was .6 mm. No beta blockade or nitro were given. The 3D data set was reconstructed in 5% intervals of the R-R cycle. Systolic  and diastolic phases were analyzed on a dedicated work station using MPR, MIP and VRT modes. The patient received 80 cc of contrast.  FINDINGS: Aortic Valve: Trileaflet aortic valve with severely thickened and calcified leaflets and minimal calcifications extending into the LVOT.  Aorta: Normal size with mild diffuse atherosclerotic plaque and calcifications and no dissection.  Sinotubular Junction: 27 x 26 mm  Ascending Thoracic Aorta: 33 x 32 mm  Aortic Arch: 26 x 26 mm  Descending Thoracic Aorta: 23 x 23 mm  Sinus of Valsalva Measurements:  Non-coronary:  29 mm  Right -coronary:  31 mm  Left -coronary:  32 mm  Coronary Artery Height above  Annulus:  Left Main: 9.6 mm  Right Coronary: 11.7 mm  Virtual Basal Annulus Measurements:  Maximum/Minimum Diameter: 25.6 x 21.1 mm  Mean Diameter: 22.5 mm  Perimeter: 72.9 mm  Area: 396 mm2  Optimum Fluoroscopic Angle for Delivery:  LAO 6 CAU 8.  IMPRESSION: 1. Trileaflet aortic valve with severely thickened and calcified leaflets and minimal calcifications extending into the LVOT. Annular measurements are suitable for delivery of a 23 mm Edwards-SAPIEN 3 valve.  2. Aortic valve calcium  score is 2316 and consistent with severe aortic stenosis.  3. Sufficient coronary to annulus distance.  4. Optimum Fluoroscopic Angle for Delivery: LAO 6 CAU 8.  5. No thrombus in the left atrial appendage.  6. Moderately dilated pulmonary artery consistent with pulmonary hypertension.  Electronically Signed: By: Christoper Crafts On: 03/31/2019 13:03     ______________________________________________________________________________________________      EKG:        Recent Labs: 06/09/2023: ALT 26; B Natriuretic Peptide 280.3; Magnesium  1.9 06/13/2023: TSH 3.250 08/21/2023: BUN 22; Creatinine, Ser 1.30; Hemoglobin 15.9; Platelets 197; Potassium 4.7; Sodium 141  Recent Lipid Panel    Component Value Date/Time   CHOL 136 06/10/2023 0851   CHOL 128 08/09/2022 0931   TRIG 50 06/10/2023 0851   HDL 62 06/10/2023 0851   HDL 63 08/09/2022 0931   CHOLHDL 2.2 06/10/2023 0851   VLDL 10 06/10/2023 0851   LDLCALC 64 06/10/2023 0851   LDLCALC 51 08/09/2022 0931     Risk Assessment/Calculations:    CHA2DS2-VASc Score = 6   This indicates a 9.7% annual risk of stroke. The patient's score is based upon: CHF History: 0 HTN History: 1 Diabetes History: 1 Stroke History: 0 Vascular Disease History: 1 Age Score: 2 Gender Score: 1           Physical Exam:    VS:  BP (!) 146/75   Pulse 83   Ht 5\' 4"  (1.626 m)   Wt 185 lb 12.8 oz (84.3 kg)   LMP  (LMP Unknown)   SpO2  99%   BMI 31.89 kg/m     Wt Readings from Last 3 Encounters:  02/14/24 185 lb 12.8 oz (84.3 kg)  11/21/23 181 lb (82.1 kg)  10/09/23 181 lb 9.6 oz (82.4 kg)     GEN:  Well nourished, well developed elderly woman in no acute distress HEENT: Normal NECK: No JVD; No carotid bruits LYMPHATICS: No lymphadenopathy CARDIAC: RRR, soft systolic murmur at the left lower sternal border, no murmur at the right upper sternal border.  No diastolic murmur. RESPIRATORY:  Clear to auscultation without rales, wheezing or rhonchi  ABDOMEN: Soft, non-tender, non-distended MUSCULOSKELETAL:  No edema; No deformity  SKIN: Warm and dry NEUROLOGIC:  Alert and oriented x 3 PSYCHIATRIC:  Normal affect   Assessment & Plan Heart failure with mildly  reduced ejection fraction (HFmrEF) (HCC) Last assessment of LVEF 40 to 45%.  NYHA functional class II symptoms present. Heart block AV complete [facility]) Patient status post permanent pacemaker, followed by Dr. Rodolfo Clan Coronary artery disease involving native coronary artery of native heart without angina pectoris Patient underwent cardiac catheterization in 2020 demonstrating nonobstructive CAD.  She is treated with rosuvastatin . Atypical atrial flutter (HCC) Anticoagulated with apixaban .  Stable at present. Essential hypertension I repeated her blood pressure and got a reading of 146/75 mmHg.  I looked over the various office visits and her blood pressure is often in normal range.  Age 28, I think her blood pressure control is acceptable and we will continue current doses of amlodipine  and spironolactone .  She has a true ACE inhibitor allergy. S/P TAVR (transcatheter aortic valve replacement) Echo from December 2024 reviewed and shows a mean transaortic valve gradient of 5 mmHg.            Medication Adjustments/Labs and Tests Ordered: Current medicines are reviewed at length with the patient today.  Concerns regarding medicines are outlined above.  No orders  of the defined types were placed in this encounter.  No orders of the defined types were placed in this encounter.   Patient Instructions  Follow-Up: At [facility], you and your health needs are our priority.  As part of our continuing mission to provide you with exceptional heart care, our providers are all part of one team.  This team includes your primary Cardiologist (physician) and Advanced Practice Providers or APPs (Physician Assistants and Nurse Practitioners) who all work together to provide you with the care you need, when you need it.  Your next appointment:   1 year(s)  Provider:   Arnoldo Lapping, MD        1st Floor: - Lobby - Registration  - Pharmacy  - Lab - Cafe  2nd Floor: - PV Lab - Diagnostic Testing (echo, CT, nuclear med)  3rd Floor: - Vacant  4th Floor: - TCTS (cardiothoracic surgery) - AFib Clinic - Structural Heart Clinic - Vascular Surgery  - Vascular Ultrasound  5th Floor: - HeartCare Cardiology (general and EP) - Clinical Pharmacy for coumadin, hypertension, lipid, weight-loss medications, and med management appointments    Valet parking services will be available as well.     Signed, Arnoldo Lapping, MD  02/14/2024 5:42 PM    [facility]

## 2024-02-18 ENCOUNTER — Other Ambulatory Visit: Payer: Self-pay

## 2024-02-18 MED ORDER — ROSUVASTATIN CALCIUM 10 MG PO TABS: 10.0000 mg | ORAL_TABLET | ORAL | 3 refills | Status: AC

## 2024-02-18 MED ORDER — EMPAGLIFLOZIN 25 MG PO TABS: 25.0000 mg | ORAL_TABLET | Freq: Every day | ORAL | 3 refills | Status: AC

## 2024-02-18 NOTE — Telephone Encounter (Signed)
 Medication sent to pharmacy

## 2024-03-10 ENCOUNTER — Ambulatory Visit (INDEPENDENT_AMBULATORY_CARE_PROVIDER_SITE_OTHER): Payer: Medicare Other

## 2024-03-10 DIAGNOSIS — I442 Atrioventricular block, complete: Secondary | ICD-10-CM

## 2024-03-11 LAB — CUP PACEART REMOTE DEVICE CHECK
Battery Remaining Longevity: 127 mo
Battery Remaining Percentage: 95.5 %
Battery Voltage: 3.02 V
Brady Statistic RV Percent Paced: 26 %
Date Time Interrogation Session: 20250519020014
Lead Channel Impedance Value: 550 Ohm
Lead Channel Pacing Threshold Amplitude: 0.75 V
Lead Channel Pacing Threshold Pulse Width: 0.5 ms
Lead Channel Sensing Intrinsic Amplitude: 12 mV
Lead Channel Setting Pacing Amplitude: 2.5 V
Lead Channel Setting Pacing Pulse Width: 0.5 ms
Lead Channel Setting Sensing Sensitivity: 2 mV
Pulse Gen Model: 1272
Pulse Gen Serial Number: 8169073

## 2024-03-17 ENCOUNTER — Ambulatory Visit: Payer: Self-pay | Admitting: Cardiovascular Disease

## 2024-03-19 ENCOUNTER — Encounter: Payer: Self-pay | Admitting: Podiatry

## 2024-03-19 ENCOUNTER — Ambulatory Visit (INDEPENDENT_AMBULATORY_CARE_PROVIDER_SITE_OTHER): Admitting: Podiatry

## 2024-03-19 DIAGNOSIS — M79672 Pain in left foot: Secondary | ICD-10-CM | POA: Diagnosis not present

## 2024-03-19 DIAGNOSIS — B351 Tinea unguium: Secondary | ICD-10-CM | POA: Diagnosis not present

## 2024-03-19 DIAGNOSIS — M79671 Pain in right foot: Secondary | ICD-10-CM

## 2024-03-19 NOTE — Progress Notes (Signed)
 Patient presents for evaluation and treatment of tenderness and some redness around nails feet.  Tenderness around toes with walking and wearing shoes.  Physical exam:  General appearance: Alert, pleasant, and in no acute distress.  Vascular: Pedal pulses: DP palpable bilaterally, PT nonpalpable bilaterally.  Mild edema lower legs bilaterally  Neurological:  No paresthesias or burning noted.  Dermatologic:  Nails thickened, disfigured, discolored 1-5 BL with subungual debris.  Redness and hypertrophic nail folds along nail folds bilaterally but no signs of drainage or infection.  Musculoskeletal:  Hammertoes 2 through 5 bilaterally   Diagnosis: 1. Painful onychomycotic nails 1 through 5 bilaterally. 2. Pain toes 1 through 5 bilaterally.  Plan: Debrided onychomycotic nails 1 through 5 bilaterally.  Return 3 months

## 2024-04-23 NOTE — Addendum Note (Signed)
 Addended by: TAWNI DRILLING D on: 04/23/2024 02:00 PM   Modules accepted: Orders

## 2024-04-23 NOTE — Progress Notes (Signed)
 Remote pacemaker transmission.

## 2024-05-02 ENCOUNTER — Ambulatory Visit: Payer: Self-pay | Admitting: Student

## 2024-05-02 ENCOUNTER — Ambulatory Visit (INDEPENDENT_AMBULATORY_CARE_PROVIDER_SITE_OTHER): Admitting: Student

## 2024-05-02 ENCOUNTER — Encounter: Payer: Self-pay | Admitting: Student

## 2024-05-02 VITALS — BP 160/89 | HR 84 | Temp 97.4°F | Ht 64.0 in | Wt 183.2 lb

## 2024-05-02 DIAGNOSIS — Z7984 Long term (current) use of oral hypoglycemic drugs: Secondary | ICD-10-CM

## 2024-05-02 DIAGNOSIS — E039 Hypothyroidism, unspecified: Secondary | ICD-10-CM | POA: Diagnosis not present

## 2024-05-02 DIAGNOSIS — I10 Essential (primary) hypertension: Secondary | ICD-10-CM | POA: Diagnosis not present

## 2024-05-02 DIAGNOSIS — E119 Type 2 diabetes mellitus without complications: Secondary | ICD-10-CM | POA: Diagnosis not present

## 2024-05-02 DIAGNOSIS — I484 Atypical atrial flutter: Secondary | ICD-10-CM | POA: Diagnosis not present

## 2024-05-02 LAB — POCT GLYCOSYLATED HEMOGLOBIN (HGB A1C): Hemoglobin A1C: 6.7 % — AB (ref 4.0–5.6)

## 2024-05-02 LAB — GLUCOSE, CAPILLARY: Glucose-Capillary: 121 mg/dL — ABNORMAL HIGH (ref 70–99)

## 2024-05-02 MED ORDER — APIXABAN 5 MG PO TABS: 5.0000 mg | ORAL_TABLET | Freq: Two times a day (BID) | ORAL | 3 refills | Status: AC

## 2024-05-02 NOTE — Assessment & Plan Note (Signed)
 Patient is in normal sinus rhythm on exam.  This is stable and chronic and she follows with cardiology which her last appointment was April.  She denies any bleeding at today's appointment. Plan: - Continue Eliquis  5 mg twice daily, will assess for dose reduction pending her BMP

## 2024-05-02 NOTE — Assessment & Plan Note (Signed)
 Stable. BP recorded at 138/87 today. Will continue amlodipine  10 mg and spironolactone  25 mg daily -BMP ordered today

## 2024-05-02 NOTE — Patient Instructions (Signed)
 Thank you, [name] for allowing us  to provide your care today. Today we discussed diabetes, synthroid , blood pressure.    You are doing a great job! Keep up the work !!  I have ordered the following labs for you:   Lab Orders         BMP8+Anion Gap         TSH         POC Hbg A1C       Follow up: 6 months    Should you have any questions or concerns please call the internal medicine clinic at [phone].     Please note that our late policy has changed.  If you are more than 15 minutes late to your appointment, you may be asked to reschedule your appointment.  Dr. Kandis, D.O. [facility]

## 2024-05-02 NOTE — Assessment & Plan Note (Signed)
 Patient is on a regimen of 100 mcg of Synthroid  daily.  Her last TSH was checked 10 months ago which was 3.250.  She denies any symptoms of hypothyroidism today. Plan -Continue Synthroid  100 mcg daily until we get the results of her TSH which was checked today

## 2024-05-02 NOTE — Progress Notes (Signed)
 Established Patient Office Visit  Subjective   Patient ID: [name], female    DOB: [birth year]  Age: 89 y.o. MRN: [id number]  Chief Complaint  Patient presents with   Follow-up    Routine office visit for DM check up / foot exam    [name] is a 89 y.o. who presents to the clinic for a follow up of DM, hypothyroidism, and HTN. Please see problem based assessment and plan for additional details.  Patient Active Problem List   Diagnosis Date Noted   Symptomatic bradycardia 07/16/2023   LBBB (left bundle branch block) 07/16/2023   Pacemaker 06/09/2023   Healthcare maintenance 02/14/2023   CAD (coronary artery disease) 04/05/2022   Atrial flutter (HCC)    Heart failure with mildly reduced ejection fraction (HFmrEF) (HCC) 03/19/2022   Stage 3a chronic kidney disease (CKD) (HCC) 03/17/2022   Hypothyroidism 03/17/2022   Severe aortic stenosis s/p TAVR 04/22/2019   Essential hypertension 11/19/2013   Type II diabetes mellitus (HCC) 03/05/2013   Hyperlipidemia       Objective:     BP (!) 160/89 (BP Location: Left Arm, Patient Position: Sitting, Cuff Size: Normal)   Pulse 84   Temp (!) 97.4 F (36.3 C) (Oral)   Ht 5' 4 (1.626 m)   Wt 183 lb 3.2 oz (83.1 kg)   LMP  (LMP Unknown)   SpO2 98%   BMI 31.45 kg/m  BP Readings from Last 3 Encounters:  05/02/24 (!) 160/89  02/14/24 (!) 146/75  11/21/23 128/70   Wt Readings from Last 3 Encounters:  05/02/24 183 lb 3.2 oz (83.1 kg)  02/14/24 185 lb 12.8 oz (84.3 kg)  11/21/23 181 lb (82.1 kg)      Physical Exam Vitals reviewed.  Constitutional:      General: She is not in acute distress.    Appearance: She is not toxic-appearing.  Cardiovascular:     Rate and Rhythm: Normal rate and regular rhythm.     Pulses: Normal pulses.  Pulmonary:     Effort: Pulmonary effort is normal.  Abdominal:     General: Bowel sounds are normal.     Palpations: Abdomen is soft.     Tenderness: There is no abdominal  tenderness.  Skin:    General: Skin is warm and dry.  Neurological:     Mental Status: She is alert.  Psychiatric:        Mood and Affect: Mood and affect normal.      Results for orders placed or performed in visit on 05/02/24  Glucose, capillary  Result Value Ref Range   Glucose-Capillary 121 (H) 70 - 99 mg/dL  POC Hbg J8R  Result Value Ref Range   Hemoglobin A1C 6.7 (A) 4.0 - 5.6 %   HbA1c POC (<> result, manual entry)     HbA1c, POC (prediabetic range)     HbA1c, POC (controlled diabetic range)      Last metabolic panel Lab Results  Component Value Date   GLUCOSE 114 (H) 08/21/2023   NA 141 08/21/2023   K 4.7 08/21/2023   CL 103 08/21/2023   CO2 23 08/21/2023   BUN 22 08/21/2023   CREATININE 1.30 (H) 08/21/2023   EGFR 39 (L) 08/21/2023   CALCIUM  10.0 08/21/2023   PHOS 3.9 06/09/2023   PROT 6.2 (L) 06/09/2023   ALBUMIN 3.4 (L) 06/09/2023   LABGLOB 2.7 12/16/2018   AGRATIO 1.7 12/16/2018   BILITOT 0.5 06/09/2023   ALKPHOS 35 (L)  06/09/2023   AST 26 06/09/2023   ALT 26 06/09/2023   ANIONGAP 16 (H) 06/11/2023   Last lipids Lab Results  Component Value Date   CHOL 136 06/10/2023   HDL 62 06/10/2023   LDLCALC 64 06/10/2023   TRIG 50 06/10/2023   CHOLHDL 2.2 06/10/2023   Last hemoglobin A1c Lab Results  Component Value Date   HGBA1C 6.7 (A) 05/02/2024      The ASCVD Risk score (Arnett DK, et al., 2019) failed to calculate for the following reasons:   The 2019 ASCVD risk score is only valid for ages 62 to 51    Assessment & Plan:   Problem List Items Addressed This Visit       Cardiovascular and Mediastinum   Essential hypertension (Chronic)   Stable. BP recorded at 138/87 today. Will continue amlodipine  10 mg and spironolactone  25 mg daily -BMP ordered today      Relevant Orders   BMP8+Anion Gap   Atrial flutter (HCC) (Chronic)   Patient is in normal sinus rhythm on exam.  This is stable and chronic and she follows with cardiology which her  last appointment was April.  She denies any bleeding at today's appointment. Plan: - Continue Eliquis  5 mg twice daily, will assess for dose reduction pending her BMP        Endocrine   Type II diabetes mellitus (HCC) - Primary (Chronic)   Patient presents with a history of T2DM with a prior A1c of 6.6 in August 2024.  Patient's A1c today is 6.7.  They are on a regimen of Jardiance  25 mg.  Plan: -Continue regimen of: Jardiance  25 mg  -A1c today -Ophthalmology referral: Has appointment in August       Relevant Orders   POC Hbg A1C (Completed)   BMP8+Anion Gap   Hypothyroidism (Chronic)   Patient is on a regimen of 100 mcg of Synthroid  daily.  Her last TSH was checked 10 months ago which was 3.250.  She denies any symptoms of hypothyroidism today. Plan -Continue Synthroid  100 mcg daily until we get the results of her TSH which was checked today       Relevant Orders   TSH    Return in about 6 months (around 11/02/2024) for Chronic conditions .   Damien Lease, DO

## 2024-05-02 NOTE — Assessment & Plan Note (Signed)
 Patient presents with a history of T2DM with a prior A1c of 6.6 in August 2024.  Patient's A1c today is 6.7.  They are on a regimen of Jardiance  25 mg.  Plan: -Continue regimen of: Jardiance  25 mg  -A1c today -Ophthalmology referral: Has appointment in August

## 2024-05-03 LAB — BMP8+ANION GAP
Anion Gap: 20 mmol/L — ABNORMAL HIGH (ref 10.0–18.0)
BUN/Creatinine Ratio: 16 (ref 12–28)
BUN: 23 mg/dL (ref 10–36)
CO2: 18 mmol/L — ABNORMAL LOW (ref 20–29)
Calcium: 10.1 mg/dL (ref 8.7–10.3)
Chloride: 100 mmol/L (ref 96–106)
Creatinine, Ser: 1.41 mg/dL — ABNORMAL HIGH (ref 0.57–1.00)
Glucose: 126 mg/dL — ABNORMAL HIGH (ref 70–99)
Potassium: 5.1 mmol/L (ref 3.5–5.2)
Sodium: 138 mmol/L (ref 134–144)
eGFR: 35 mL/min/1.73 — ABNORMAL LOW (ref >59)

## 2024-05-03 LAB — TSH: TSH: 1.11 u[IU]/mL (ref 0.450–4.500)

## 2024-05-05 ENCOUNTER — Encounter: Payer: Self-pay | Admitting: *Deleted

## 2024-05-08 NOTE — Progress Notes (Signed)
 Internal Medicine Clinic Attending  Case discussed with the resident at the time of the visit.  We reviewed the resident's history and exam and pertinent patient test results.  I agree with the assessment, diagnosis, and plan of care documented in the resident's note.

## 2024-06-02 DIAGNOSIS — H401132 Primary open-angle glaucoma, bilateral, moderate stage: Secondary | ICD-10-CM | POA: Diagnosis not present

## 2024-06-02 DIAGNOSIS — H5201 Hypermetropia, right eye: Secondary | ICD-10-CM | POA: Diagnosis not present

## 2024-06-04 ENCOUNTER — Telehealth: Payer: Self-pay | Admitting: *Deleted

## 2024-06-04 NOTE — Telephone Encounter (Signed)
 RTC to patient asked her how bad her legs are swelling.  Stated not to bad.  Stated that wearing the stockings helps.  Does prop her feet up and this helps per patient.  No pain or shortness of breath.  Does not have the swelling every day.   Informed patient that if the swelling worsens or she has shortness of breath to call the Clinics and be seen by another doctor.  Stated would do that.  Wants to wait to see Dr. Chi=un when she has an available appointment.  Copied from CRM [id number]. Topic: Appointments - Appointment Scheduling >> Jun 04, 2024  2:09 PM Marda MATSU wrote: Pt Baggett stated that she was instructed by her previous provider to schedule an appointment with her new provider Dr shawn. Dr Angelene schedule did not populate. Patient stated that she was having some swelling in the legs, I offered her another provider but she declined and stated she is going to wait for Dr shawn.   Please advise.

## 2024-06-09 ENCOUNTER — Ambulatory Visit (INDEPENDENT_AMBULATORY_CARE_PROVIDER_SITE_OTHER): Payer: Medicare Other

## 2024-06-09 DIAGNOSIS — I442 Atrioventricular block, complete: Secondary | ICD-10-CM

## 2024-06-10 LAB — CUP PACEART REMOTE DEVICE CHECK
Battery Remaining Longevity: 124 mo
Battery Remaining Percentage: 94 %
Battery Voltage: 3.02 V
Brady Statistic RV Percent Paced: 24 %
Date Time Interrogation Session: 20250818020013
Lead Channel Impedance Value: 510 Ohm
Lead Channel Pacing Threshold Amplitude: 0.75 V
Lead Channel Pacing Threshold Pulse Width: 0.5 ms
Lead Channel Sensing Intrinsic Amplitude: 11.3 mV
Lead Channel Setting Pacing Amplitude: 2.5 V
Lead Channel Setting Pacing Pulse Width: 0.5 ms
Lead Channel Setting Sensing Sensitivity: 2 mV
Pulse Gen Model: 1272
Pulse Gen Serial Number: 8169073

## 2024-06-11 ENCOUNTER — Ambulatory Visit: Admitting: Cardiovascular Disease

## 2024-06-12 ENCOUNTER — Ambulatory Visit: Payer: Self-pay | Admitting: Cardiovascular Disease

## 2024-06-19 ENCOUNTER — Encounter: Payer: Self-pay | Admitting: Podiatry

## 2024-06-19 ENCOUNTER — Ambulatory Visit: Admitting: Podiatry

## 2024-06-19 DIAGNOSIS — M79672 Pain in left foot: Secondary | ICD-10-CM | POA: Diagnosis not present

## 2024-06-19 DIAGNOSIS — M79671 Pain in right foot: Secondary | ICD-10-CM

## 2024-06-19 DIAGNOSIS — B351 Tinea unguium: Secondary | ICD-10-CM

## 2024-06-19 NOTE — Progress Notes (Signed)
 Patient presents for evaluation and treatment of tenderness and some redness around nails feet.  Tenderness around toes with walking and wearing shoes.  Physical exam:  General appearance: Alert, pleasant, and in no acute distress.  Vascular: Pedal pulses: DP 2/4 B/L, PT 0/4 B/L.  Mild to moderate edema lower legs bilaterally  Neurological:    Dermatologic:  Nails thickened, disfigured, discolored 1-5 BL with subungual debris.  Redness and hypertrophic nail folds along nail folds bilaterally but no signs of drainage or infection.  Musculoskeletal:     Diagnosis: 1. Painful onychomycotic nails 1 through 5 bilaterally. 2. Pain toes 1 through 5 bilaterally.  Plan: Debrided onychomycotic nails 1 through 5 bilaterally.  Sharply debrided nails with a nail clipper and reduced  with a power bur.  Return 3 months RFC
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
## 2024-07-02 ENCOUNTER — Ambulatory Visit (INDEPENDENT_AMBULATORY_CARE_PROVIDER_SITE_OTHER)

## 2024-07-02 ENCOUNTER — Other Ambulatory Visit: Payer: Self-pay

## 2024-07-02 VITALS — BP 155/68 | HR 61 | Temp 97.5°F | Ht 64.0 in | Wt 185.4 lb

## 2024-07-02 DIAGNOSIS — I10 Essential (primary) hypertension: Secondary | ICD-10-CM

## 2024-07-02 DIAGNOSIS — N1832 Chronic kidney disease, stage 3b: Secondary | ICD-10-CM | POA: Diagnosis not present

## 2024-07-02 DIAGNOSIS — E119 Type 2 diabetes mellitus without complications: Secondary | ICD-10-CM

## 2024-07-02 DIAGNOSIS — I5022 Chronic systolic (congestive) heart failure: Secondary | ICD-10-CM | POA: Diagnosis not present

## 2024-07-02 DIAGNOSIS — I130 Hypertensive heart and chronic kidney disease with heart failure and stage 1 through stage 4 chronic kidney disease, or unspecified chronic kidney disease: Secondary | ICD-10-CM

## 2024-07-02 DIAGNOSIS — E1122 Type 2 diabetes mellitus with diabetic chronic kidney disease: Secondary | ICD-10-CM | POA: Diagnosis not present

## 2024-07-02 DIAGNOSIS — Z0000 Encounter for general adult medical examination without abnormal findings: Secondary | ICD-10-CM

## 2024-07-02 NOTE — Assessment & Plan Note (Signed)
 Elevated BP in office today. She was normotensive during previous visit on the same regimen. Encouraged home BP monitoring, will continue current regimen and recheck at next visit. Would not aggressively manage her BP given her age.

## 2024-07-02 NOTE — Assessment & Plan Note (Signed)
 Flu vaccine UTD, counseled on Covid vaccine.

## 2024-07-02 NOTE — Patient Instructions (Signed)
 Thank you for coming in today. If you have any questions or concerns, please feel free to contact me via MyChart or call the office.   We will plan to repeat your labs to monitor your kidney function in 3 months.   Have a great day.

## 2024-07-02 NOTE — Assessment & Plan Note (Signed)
 Eye exam 05/2024 UTD.

## 2024-07-02 NOTE — Assessment & Plan Note (Signed)
 Follows w/ Cardiology. Currently asymptomatic. Continue Jardiance  and Spironolactone . ACEi contraindicated d/t allergy.

## 2024-07-16 NOTE — Progress Notes (Signed)
 Remote PPM Transmission

## 2024-08-06 ENCOUNTER — Ambulatory Visit

## 2024-08-06 VITALS — Ht 64.0 in | Wt 185.0 lb

## 2024-08-06 DIAGNOSIS — Z0000 Encounter for general adult medical examination without abnormal findings: Secondary | ICD-10-CM

## 2024-08-06 NOTE — Progress Notes (Signed)
 Internal Medicine Attending:  I reviewed the AWV findings of the medical professional who conducted the visit. I was present in the office suite and immediately available to provide assistance and direction throughout the time the service was provided.

## 2024-08-06 NOTE — Progress Notes (Unsigned)
 I reviewed the AWV findings of the licensed medical professional who conducted the visit. I was present in the office suite and immediately available to provide assistance and direction throughout the time the service was provided.

## 2024-08-06 NOTE — Patient Instructions (Signed)
 [name],  Thank you for taking the time for your Medicare Wellness Visit. I appreciate your continued commitment to your health goals. Please review the care plan we discussed, and feel free to reach out if I can assist you further.  Medicare recommends these wellness visits once per year to help you and your care team stay ahead of potential health issues. These visits are designed to focus on prevention, allowing your provider to concentrate on managing your acute and chronic conditions during your regular appointments.  Please note that Annual Wellness Visits do not include a physical exam. Some assessments may be limited, especially if the visit was conducted virtually. If needed, we may recommend a separate in-person follow-up with your provider.  Ongoing Care Seeing your primary care provider every 3 to 6 months helps us  monitor your health and provide consistent, personalized care.   Referrals If a referral was made during today's visit and you haven't received any updates within two weeks, please contact the referred provider directly to check on the status.  Recommended Screenings:  Health Maintenance  Topic Date Due   COVID-19 Vaccine (6 - 2025-26 season) 06/23/2024   Hemoglobin A1C  11/02/2024   Complete foot exam   05/02/2025   Eye exam for diabetics  06/03/2025   Medicare Annual Wellness Visit  08/06/2025   DTaP/Tdap/Td vaccine (2 - Td or Tdap) 01/03/2028   Pneumococcal Vaccine for age over 20  Completed   Flu Shot  Completed   DEXA scan (bone density measurement)  Completed   Zoster (Shingles) Vaccine  Completed   Meningitis B Vaccine  Aged Out       07/02/2024   10:19 AM  Advanced Directives  Does Patient Have a Medical Advance Directive? Yes  Type of Advance Directive Living will   Advance Care Planning is important because it: Ensures you receive medical care that aligns with your values, goals, and preferences. Provides guidance to your family and loved  ones, reducing the emotional burden of decision-making during critical moments.  Vision: Annual vision screenings are recommended for early detection of glaucoma, cataracts, and diabetic retinopathy. These exams can also reveal signs of chronic conditions such as diabetes and high blood pressure.  Dental: Annual dental screenings help detect early signs of oral cancer, gum disease, and other conditions linked to overall health, including heart disease and diabetes.  Please see the attached documents for additional preventive care recommendations.
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
## 2024-09-08 ENCOUNTER — Ambulatory Visit (INDEPENDENT_AMBULATORY_CARE_PROVIDER_SITE_OTHER): Payer: Medicare Other

## 2024-09-08 DIAGNOSIS — I442 Atrioventricular block, complete: Secondary | ICD-10-CM | POA: Diagnosis not present

## 2024-09-09 LAB — CUP PACEART REMOTE DEVICE CHECK
Battery Remaining Longevity: 121 mo
Battery Remaining Percentage: 92 %
Battery Voltage: 3.02 V
Brady Statistic RV Percent Paced: 27 %
Date Time Interrogation Session: 20251117020018
Lead Channel Impedance Value: 510 Ohm
Lead Channel Pacing Threshold Amplitude: 0.75 V
Lead Channel Pacing Threshold Pulse Width: 0.5 ms
Lead Channel Sensing Intrinsic Amplitude: 12 mV
Lead Channel Setting Pacing Amplitude: 2.5 V
Lead Channel Setting Pacing Pulse Width: 0.5 ms
Lead Channel Setting Sensing Sensitivity: 2 mV
Pulse Gen Model: 1272
Pulse Gen Serial Number: 8169073

## 2024-09-10 NOTE — Progress Notes (Signed)
 Remote PPM Transmission

## 2024-09-16 ENCOUNTER — Ambulatory Visit (INDEPENDENT_AMBULATORY_CARE_PROVIDER_SITE_OTHER): Admitting: Podiatry

## 2024-09-16 DIAGNOSIS — B351 Tinea unguium: Secondary | ICD-10-CM | POA: Diagnosis not present

## 2024-09-16 DIAGNOSIS — M79672 Pain in left foot: Secondary | ICD-10-CM | POA: Diagnosis not present

## 2024-09-16 DIAGNOSIS — M79671 Pain in right foot: Secondary | ICD-10-CM | POA: Diagnosis not present

## 2024-09-16 NOTE — Progress Notes (Signed)
 Patient presents for evaluation and treatment of tenderness and some redness around nails feet.  Tenderness around toes with walking and wearing shoes.  Physical exam:  General appearance: Alert, pleasant, and in no acute distress.  Vascular: Pedal pulses: DP 2/4 B/L, PT 0/4 B/L.  Mild to moderate edema lower legs bilaterally.  Capillary refill time immediate bilaterally  Neurologic:  Dermatologic:  Nails thickened, disfigured, discolored 1-5 BL with subungual debris.  Redness and hypertrophic nail folds along nail folds bilaterally but no signs of drainage or infection.  Musculoskeletal:     Diagnosis: 1. Painful onychomycotic nails 1 through 5 bilaterally. 2. Pain toes 1 through 5 bilaterally.  Plan: -Debrided onychomycotic nails 1 through 5 bilaterally.  Sharply debrided nails with nail clipper and reduced with a power bur.  Return 3 months RFC

## 2024-09-22 ENCOUNTER — Ambulatory Visit: Payer: Self-pay | Admitting: Cardiovascular Disease

## 2024-09-25 ENCOUNTER — Other Ambulatory Visit: Payer: Self-pay

## 2024-09-25 DIAGNOSIS — I10 Essential (primary) hypertension: Secondary | ICD-10-CM

## 2024-09-25 MED ORDER — AMLODIPINE BESYLATE 10 MG PO TABS: 10.0000 mg | ORAL_TABLET | Freq: Every day | ORAL | 2 refills | Status: AC

## 2024-09-25 NOTE — Telephone Encounter (Signed)
 Medication sent to pharmacy

## 2024-11-26 ENCOUNTER — Ambulatory Visit: Payer: Self-pay

## 2024-12-10 ENCOUNTER — Ambulatory Visit

## 2024-12-17 ENCOUNTER — Ambulatory Visit: Admitting: Podiatry
